# Patient Record
Sex: Female | Born: 1935 | Race: White | Hispanic: No | Smoking: Never smoker
Health system: Southern US, Community
[De-identification: ages and names within clinical notes are randomized; demographics above are authoritative.]

## PROBLEM LIST (undated history)

## (undated) DIAGNOSIS — D485 Neoplasm of uncertain behavior of skin: Secondary | ICD-10-CM

## (undated) DIAGNOSIS — I10 Essential (primary) hypertension: Secondary | ICD-10-CM

## (undated) DIAGNOSIS — R7309 Other abnormal glucose: Secondary | ICD-10-CM

## (undated) DIAGNOSIS — E039 Hypothyroidism, unspecified: Secondary | ICD-10-CM

## (undated) DIAGNOSIS — L0291 Cutaneous abscess, unspecified: Secondary | ICD-10-CM

## (undated) DIAGNOSIS — E119 Type 2 diabetes mellitus without complications: Secondary | ICD-10-CM

## (undated) DIAGNOSIS — K5790 Diverticulosis of intestine, part unspecified, without perforation or abscess without bleeding: Secondary | ICD-10-CM

## (undated) DIAGNOSIS — L0390 Cellulitis, unspecified: Secondary | ICD-10-CM

## (undated) DIAGNOSIS — D126 Benign neoplasm of colon, unspecified: Secondary | ICD-10-CM

## (undated) DIAGNOSIS — I639 Cerebral infarction, unspecified: Secondary | ICD-10-CM

## (undated) HISTORY — DX: Hypothyroidism, unspecified: E03.9

## (undated) HISTORY — PX: THYROIDECTOMY: SHX17

## (undated) HISTORY — PX: OOPHORECTOMY: SHX86

## (undated) HISTORY — DX: Type 2 diabetes mellitus without complications: E11.9

## (undated) HISTORY — PX: TONSILLECTOMY AND ADENOIDECTOMY: SUR1326

## (undated) HISTORY — PX: ABDOMINAL HYSTERECTOMY: SHX81

## (undated) HISTORY — DX: Cerebral infarction, unspecified: I63.9

## (undated) HISTORY — DX: Benign neoplasm of colon, unspecified: D12.6

## (undated) HISTORY — DX: Other abnormal glucose: R73.09

## (undated) HISTORY — DX: Neoplasm of uncertain behavior of skin: D48.5

## (undated) HISTORY — PX: APPENDECTOMY: SHX54

## (undated) HISTORY — PX: CHOLECYSTECTOMY: SHX55

## (undated) HISTORY — DX: Diverticulosis of intestine, part unspecified, without perforation or abscess without bleeding: K57.90

## (undated) HISTORY — DX: Cellulitis, unspecified: L03.90

## (undated) HISTORY — DX: Essential (primary) hypertension: I10

## (undated) HISTORY — DX: Cutaneous abscess, unspecified: L02.91

---

## 1998-10-24 ENCOUNTER — Encounter: Payer: Self-pay | Admitting: Internal Medicine

## 1999-10-28 ENCOUNTER — Encounter: Admission: RE | Admit: 1999-10-28 | Discharge: 1999-10-28 | Payer: Self-pay | Admitting: Internal Medicine

## 1999-10-28 ENCOUNTER — Encounter: Payer: Self-pay | Admitting: Internal Medicine

## 2000-10-29 ENCOUNTER — Encounter: Admission: RE | Admit: 2000-10-29 | Discharge: 2000-10-29 | Payer: Self-pay | Admitting: Internal Medicine

## 2000-10-29 ENCOUNTER — Encounter: Payer: Self-pay | Admitting: Internal Medicine

## 2001-02-23 DIAGNOSIS — D126 Benign neoplasm of colon, unspecified: Secondary | ICD-10-CM

## 2001-02-23 HISTORY — DX: Benign neoplasm of colon, unspecified: D12.6

## 2001-03-25 ENCOUNTER — Other Ambulatory Visit: Admission: RE | Admit: 2001-03-25 | Discharge: 2001-03-25 | Payer: Self-pay | Admitting: Gastroenterology

## 2001-03-25 ENCOUNTER — Encounter (INDEPENDENT_AMBULATORY_CARE_PROVIDER_SITE_OTHER): Payer: Self-pay | Admitting: Specialist

## 2001-11-05 ENCOUNTER — Encounter: Payer: Self-pay | Admitting: Internal Medicine

## 2001-11-05 ENCOUNTER — Encounter: Admission: RE | Admit: 2001-11-05 | Discharge: 2001-11-05 | Payer: Self-pay | Admitting: Internal Medicine

## 2002-08-30 ENCOUNTER — Encounter: Payer: Self-pay | Admitting: Internal Medicine

## 2002-09-10 ENCOUNTER — Encounter: Admission: RE | Admit: 2002-09-10 | Discharge: 2002-09-10 | Payer: Self-pay | Admitting: Internal Medicine

## 2002-09-10 ENCOUNTER — Encounter: Payer: Self-pay | Admitting: Internal Medicine

## 2002-09-20 ENCOUNTER — Encounter: Payer: Self-pay | Admitting: Internal Medicine

## 2002-09-20 ENCOUNTER — Encounter: Admission: RE | Admit: 2002-09-20 | Discharge: 2002-09-20 | Payer: Self-pay | Admitting: Internal Medicine

## 2002-09-27 ENCOUNTER — Encounter: Admission: RE | Admit: 2002-09-27 | Discharge: 2002-10-07 | Payer: Self-pay | Admitting: Internal Medicine

## 2002-11-07 ENCOUNTER — Encounter: Payer: Self-pay | Admitting: Internal Medicine

## 2002-11-07 ENCOUNTER — Encounter: Admission: RE | Admit: 2002-11-07 | Discharge: 2002-11-07 | Payer: Self-pay | Admitting: Internal Medicine

## 2003-12-08 ENCOUNTER — Encounter: Admission: RE | Admit: 2003-12-08 | Discharge: 2003-12-08 | Payer: Self-pay | Admitting: Internal Medicine

## 2004-03-26 ENCOUNTER — Ambulatory Visit: Payer: Self-pay | Admitting: Gastroenterology

## 2004-04-05 ENCOUNTER — Encounter: Payer: Self-pay | Admitting: Internal Medicine

## 2004-04-05 ENCOUNTER — Ambulatory Visit: Payer: Self-pay | Admitting: Gastroenterology

## 2004-04-05 HISTORY — PX: COLONOSCOPY: SHX174

## 2004-04-09 ENCOUNTER — Ambulatory Visit: Payer: Self-pay | Admitting: Internal Medicine

## 2004-09-12 ENCOUNTER — Ambulatory Visit: Payer: Self-pay | Admitting: Internal Medicine

## 2004-09-23 ENCOUNTER — Ambulatory Visit: Payer: Self-pay | Admitting: Internal Medicine

## 2004-12-18 ENCOUNTER — Encounter: Admission: RE | Admit: 2004-12-18 | Discharge: 2004-12-18 | Payer: Self-pay | Admitting: Internal Medicine

## 2005-04-23 ENCOUNTER — Ambulatory Visit: Payer: Self-pay | Admitting: Internal Medicine

## 2005-12-05 ENCOUNTER — Ambulatory Visit: Payer: Self-pay | Admitting: Internal Medicine

## 2005-12-24 ENCOUNTER — Other Ambulatory Visit: Admission: RE | Admit: 2005-12-24 | Discharge: 2005-12-24 | Payer: Self-pay | Admitting: Gynecology

## 2006-01-09 ENCOUNTER — Encounter: Admission: RE | Admit: 2006-01-09 | Discharge: 2006-01-09 | Payer: Self-pay | Admitting: Gynecology

## 2006-02-13 ENCOUNTER — Ambulatory Visit: Payer: Self-pay | Admitting: Internal Medicine

## 2006-03-11 ENCOUNTER — Ambulatory Visit: Payer: Self-pay | Admitting: Family Medicine

## 2006-03-11 LAB — CONVERTED CEMR LAB
ALT: 46 units/L — ABNORMAL HIGH (ref 0–40)
AST: 35 units/L (ref 0–37)
Basophils Relative: 0.6 % (ref 0.0–1.0)
Calcium: 9.8 mg/dL (ref 8.4–10.5)
GFR calc non Af Amer: 65 mL/min
Glomerular Filtration Rate, Af Am: 79 mL/min/{1.73_m2}
HCT: 38.4 % (ref 36.0–46.0)
HDL: 40.3 mg/dL (ref >39.0)
Hemoglobin: 12.8 g/dL (ref 12.0–15.0)
Platelets: 217 10*3/uL (ref 150–400)
RBC: 4.08 M/uL (ref 3.87–5.11)
Sodium: 141 meq/L (ref 135–145)
TSH: 3.98 microintl units/mL (ref 0.35–5.50)
Total Protein: 6.8 g/dL (ref 6.0–8.3)
VLDL: 50 mg/dL — ABNORMAL HIGH (ref 0–40)
WBC: 9.5 10*3/uL (ref 4.5–10.5)

## 2006-03-20 ENCOUNTER — Ambulatory Visit: Payer: Self-pay | Admitting: Family Medicine

## 2006-04-21 ENCOUNTER — Ambulatory Visit: Payer: Self-pay | Admitting: Internal Medicine

## 2006-04-22 LAB — CONVERTED CEMR LAB
ALT: 60 units/L — ABNORMAL HIGH (ref 0–40)
AST: 46 units/L — ABNORMAL HIGH (ref 0–37)
Albumin: 3.9 g/dL (ref 3.5–5.2)
Alkaline Phosphatase: 88 units/L (ref 39–117)
BUN: 11 mg/dL (ref 6–23)
Basophils Absolute: 0.1 10*3/uL (ref 0.0–0.1)
Basophils Relative: 1 % (ref 0.0–1.0)
Bilirubin, Direct: 0.1 mg/dL (ref 0.0–0.3)
CO2: 28 meq/L (ref 19–32)
Calcium: 9.6 mg/dL (ref 8.4–10.5)
Chloride: 103 meq/L (ref 96–112)
Creatinine, Ser: 0.9 mg/dL (ref 0.4–1.2)
Eosinophil percent: 2.3 % (ref 0.0–5.0)
GFR calc non Af Amer: 65 mL/min
Glomerular Filtration Rate, Af Am: 79 mL/min/{1.73_m2}
Glucose, Bld: 91 mg/dL (ref 70–99)
HCT: 43.1 % (ref 36.0–46.0)
Hemoglobin: 14.1 g/dL (ref 12.0–15.0)
Lymphocytes Relative: 35.5 % (ref 12.0–46.0)
MCHC: 32.7 g/dL (ref 30.0–36.0)
MCV: 93.7 fL (ref 78.0–100.0)
Monocytes Absolute: 0.5 10*3/uL (ref 0.2–0.7)
Monocytes Relative: 6.1 % (ref 3.0–11.0)
Neutro Abs: 5 10*3/uL (ref 1.4–7.7)
Neutrophils Relative %: 55.1 % (ref 43.0–77.0)
Platelets: 225 10*3/uL (ref 150–400)
Potassium: 4.8 meq/L (ref 3.5–5.1)
RBC: 4.6 M/uL (ref 3.87–5.11)
RDW: 12.5 % (ref 11.5–14.6)
Sodium: 141 meq/L (ref 135–145)
Total Bilirubin: 0.9 mg/dL (ref 0.3–1.2)
Total Protein: 7 g/dL (ref 6.0–8.3)
WBC: 9 10*3/uL (ref 4.5–10.5)

## 2006-04-24 ENCOUNTER — Ambulatory Visit: Payer: Self-pay | Admitting: Family Medicine

## 2006-05-05 ENCOUNTER — Ambulatory Visit: Payer: Self-pay | Admitting: Internal Medicine

## 2006-05-05 LAB — CONVERTED CEMR LAB
ALT: 48 units/L — ABNORMAL HIGH (ref 0–40)
AST: 27 units/L (ref 0–37)
Albumin: 3.5 g/dL (ref 3.5–5.2)
Alkaline Phosphatase: 75 units/L (ref 39–117)
Bilirubin, Direct: 0.1 mg/dL (ref 0.0–0.3)
Total Bilirubin: 0.6 mg/dL (ref 0.3–1.2)
Total Protein: 6.7 g/dL (ref 6.0–8.3)

## 2006-06-29 ENCOUNTER — Ambulatory Visit: Payer: Self-pay | Admitting: Internal Medicine

## 2006-06-29 ENCOUNTER — Encounter: Admission: RE | Admit: 2006-06-29 | Discharge: 2006-06-29 | Payer: Self-pay | Admitting: Internal Medicine

## 2006-07-13 ENCOUNTER — Ambulatory Visit: Payer: Self-pay | Admitting: Internal Medicine

## 2006-07-13 LAB — CONVERTED CEMR LAB
BUN: 12 mg/dL (ref 6–23)
Basophils Absolute: 0.1 10*3/uL (ref 0.0–0.1)
Basophils Relative: 0.8 % (ref 0.0–1.0)
CO2: 28 meq/L (ref 19–32)
Calcium: 9.8 mg/dL (ref 8.4–10.5)
Chloride: 105 meq/L (ref 96–112)
Creatinine, Ser: 0.8 mg/dL (ref 0.4–1.2)
Eosinophils Absolute: 0.3 10*3/uL (ref 0.0–0.6)
Eosinophils Relative: 2.8 % (ref 0.0–5.0)
GFR calc Af Amer: 90 mL/min
GFR calc non Af Amer: 75 mL/min
Glucose, Bld: 86 mg/dL (ref 70–99)
HCT: 39.7 % (ref 36.0–46.0)
Hemoglobin: 13.7 g/dL (ref 12.0–15.0)
Lymphocytes Relative: 36.1 % (ref 12.0–46.0)
MCHC: 34.6 g/dL (ref 30.0–36.0)
MCV: 91.2 fL (ref 78.0–100.0)
Monocytes Absolute: 0.8 10*3/uL — ABNORMAL HIGH (ref 0.2–0.7)
Monocytes Relative: 8.1 % (ref 3.0–11.0)
Neutro Abs: 4.8 10*3/uL (ref 1.4–7.7)
Neutrophils Relative %: 52.2 % (ref 43.0–77.0)
Platelets: 225 10*3/uL (ref 150–400)
Potassium: 4.4 meq/L (ref 3.5–5.1)
RBC: 4.35 M/uL (ref 3.87–5.11)
RDW: 12.7 % (ref 11.5–14.6)
Sodium: 143 meq/L (ref 135–145)
WBC: 9.4 10*3/uL (ref 4.5–10.5)

## 2006-07-16 ENCOUNTER — Ambulatory Visit: Payer: Self-pay | Admitting: Cardiology

## 2006-10-06 ENCOUNTER — Ambulatory Visit: Payer: Self-pay | Admitting: Internal Medicine

## 2007-02-17 ENCOUNTER — Encounter: Admission: RE | Admit: 2007-02-17 | Discharge: 2007-02-17 | Payer: Self-pay | Admitting: Internal Medicine

## 2007-03-17 ENCOUNTER — Ambulatory Visit: Payer: Self-pay | Admitting: Internal Medicine

## 2007-03-18 ENCOUNTER — Encounter: Payer: Self-pay | Admitting: Internal Medicine

## 2007-03-19 ENCOUNTER — Telehealth: Payer: Self-pay | Admitting: Internal Medicine

## 2007-03-21 ENCOUNTER — Encounter: Admission: RE | Admit: 2007-03-21 | Discharge: 2007-03-21 | Payer: Self-pay | Admitting: Internal Medicine

## 2007-04-01 ENCOUNTER — Telehealth: Payer: Self-pay | Admitting: Internal Medicine

## 2007-04-05 ENCOUNTER — Ambulatory Visit (HOSPITAL_COMMUNITY): Admission: RE | Admit: 2007-04-05 | Discharge: 2007-04-05 | Payer: Self-pay | Admitting: Internal Medicine

## 2007-04-05 ENCOUNTER — Encounter: Payer: Self-pay | Admitting: Internal Medicine

## 2007-04-09 ENCOUNTER — Telehealth: Payer: Self-pay | Admitting: Internal Medicine

## 2007-04-10 ENCOUNTER — Ambulatory Visit (HOSPITAL_COMMUNITY): Admission: RE | Admit: 2007-04-10 | Discharge: 2007-04-10 | Payer: Self-pay | Admitting: Internal Medicine

## 2007-04-13 ENCOUNTER — Ambulatory Visit: Payer: Self-pay | Admitting: Internal Medicine

## 2007-04-13 ENCOUNTER — Telehealth: Payer: Self-pay | Admitting: Internal Medicine

## 2007-04-13 DIAGNOSIS — I10 Essential (primary) hypertension: Secondary | ICD-10-CM | POA: Insufficient documentation

## 2007-04-13 DIAGNOSIS — E039 Hypothyroidism, unspecified: Secondary | ICD-10-CM | POA: Insufficient documentation

## 2007-04-13 HISTORY — DX: Hypothyroidism, unspecified: E03.9

## 2007-04-13 HISTORY — DX: Essential (primary) hypertension: I10

## 2007-04-15 ENCOUNTER — Encounter: Payer: Self-pay | Admitting: Internal Medicine

## 2007-04-19 ENCOUNTER — Encounter: Admission: RE | Admit: 2007-04-19 | Discharge: 2007-05-26 | Payer: Self-pay | Admitting: Orthopedic Surgery

## 2007-04-21 ENCOUNTER — Ambulatory Visit (HOSPITAL_COMMUNITY): Admission: RE | Admit: 2007-04-21 | Discharge: 2007-04-21 | Payer: Self-pay | Admitting: Orthopaedic Surgery

## 2007-04-21 ENCOUNTER — Ambulatory Visit: Payer: Self-pay | Admitting: *Deleted

## 2007-04-21 ENCOUNTER — Encounter (INDEPENDENT_AMBULATORY_CARE_PROVIDER_SITE_OTHER): Payer: Self-pay | Admitting: Orthopaedic Surgery

## 2007-05-28 ENCOUNTER — Encounter: Admission: RE | Admit: 2007-05-28 | Discharge: 2007-06-04 | Payer: Self-pay | Admitting: Orthopedic Surgery

## 2007-09-29 ENCOUNTER — Ambulatory Visit: Payer: Self-pay | Admitting: Internal Medicine

## 2007-09-30 LAB — CONVERTED CEMR LAB
ALT: 67 units/L — ABNORMAL HIGH (ref 0–35)
AST: 51 units/L — ABNORMAL HIGH (ref 0–37)
Albumin: 3.8 g/dL (ref 3.5–5.2)
Alkaline Phosphatase: 73 units/L (ref 39–117)
Basophils Absolute: 0 10*3/uL (ref 0.0–0.1)
Creatinine, Ser: 0.9 mg/dL (ref 0.4–1.2)
Eosinophils Absolute: 0.2 10*3/uL (ref 0.0–0.7)
Glucose, Bld: 119 mg/dL — ABNORMAL HIGH (ref 70–99)
HCT: 41.2 % (ref 36.0–46.0)
Hemoglobin: 13.9 g/dL (ref 12.0–15.0)
MCHC: 33.8 g/dL (ref 30.0–36.0)
MCV: 92.3 fL (ref 78.0–100.0)
Monocytes Absolute: 0.5 10*3/uL (ref 0.1–1.0)
Neutrophils Relative %: 56.1 % (ref 43.0–77.0)
Potassium: 4.5 meq/L (ref 3.5–5.1)
RBC: 4.46 M/uL (ref 3.87–5.11)
Sodium: 145 meq/L (ref 135–145)
Total Protein: 7.3 g/dL (ref 6.0–8.3)

## 2007-10-04 ENCOUNTER — Telehealth (INDEPENDENT_AMBULATORY_CARE_PROVIDER_SITE_OTHER): Payer: Self-pay | Admitting: *Deleted

## 2007-10-05 ENCOUNTER — Ambulatory Visit: Payer: Self-pay | Admitting: Cardiology

## 2007-10-07 ENCOUNTER — Telehealth: Payer: Self-pay | Admitting: Internal Medicine

## 2007-11-12 ENCOUNTER — Telehealth: Payer: Self-pay | Admitting: Internal Medicine

## 2008-01-28 ENCOUNTER — Telehealth (INDEPENDENT_AMBULATORY_CARE_PROVIDER_SITE_OTHER): Payer: Self-pay | Admitting: *Deleted

## 2008-02-01 ENCOUNTER — Ambulatory Visit: Payer: Self-pay | Admitting: Internal Medicine

## 2008-02-01 LAB — CONVERTED CEMR LAB
Glucose, Urine, Semiquant: NEGATIVE
Specific Gravity, Urine: 1.025
WBC Urine, dipstick: NEGATIVE

## 2008-02-02 ENCOUNTER — Telehealth: Payer: Self-pay | Admitting: Internal Medicine

## 2008-02-10 ENCOUNTER — Ambulatory Visit: Payer: Self-pay | Admitting: Internal Medicine

## 2008-02-11 LAB — CONVERTED CEMR LAB
BUN: 14 mg/dL (ref 6–23)
Basophils Absolute: 0 10*3/uL (ref 0.0–0.1)
Chloride: 107 meq/L (ref 96–112)
Creatinine, Ser: 0.9 mg/dL (ref 0.4–1.2)
Direct LDL: 139.7 mg/dL
Eosinophils Relative: 2.5 % (ref 0.0–5.0)
GFR calc non Af Amer: 65 mL/min
Glucose, Bld: 124 mg/dL — ABNORMAL HIGH (ref 70–99)
Hemoglobin: 14.2 g/dL (ref 12.0–15.0)
Lymphocytes Relative: 39.2 % (ref 12.0–46.0)
MCHC: 34.4 g/dL (ref 30.0–36.0)
Monocytes Absolute: 0.5 10*3/uL (ref 0.1–1.0)
Neutro Abs: 3.6 10*3/uL (ref 1.4–7.7)
Platelets: 221 10*3/uL (ref 150–400)
RBC: 4.39 M/uL (ref 3.87–5.11)
Sodium: 144 meq/L (ref 135–145)
TSH: 2.87 microintl units/mL (ref 0.35–5.50)
Total CHOL/HDL Ratio: 3.9
Triglycerides: 117 mg/dL (ref 0–149)
VLDL: 23 mg/dL (ref 0–40)
WBC: 7 10*3/uL (ref 4.5–10.5)

## 2008-02-18 ENCOUNTER — Encounter: Admission: RE | Admit: 2008-02-18 | Discharge: 2008-02-18 | Payer: Self-pay | Admitting: Internal Medicine

## 2008-03-29 ENCOUNTER — Telehealth: Payer: Self-pay | Admitting: Internal Medicine

## 2008-04-10 ENCOUNTER — Ambulatory Visit: Payer: Self-pay | Admitting: Cardiology

## 2008-05-01 ENCOUNTER — Telehealth: Payer: Self-pay | Admitting: Internal Medicine

## 2008-10-18 ENCOUNTER — Ambulatory Visit: Payer: Self-pay | Admitting: Internal Medicine

## 2008-10-18 ENCOUNTER — Encounter: Payer: Self-pay | Admitting: Internal Medicine

## 2008-10-24 ENCOUNTER — Telehealth: Payer: Self-pay | Admitting: Internal Medicine

## 2008-11-30 ENCOUNTER — Ambulatory Visit: Payer: Self-pay | Admitting: Internal Medicine

## 2008-12-18 ENCOUNTER — Ambulatory Visit: Payer: Self-pay | Admitting: Family Medicine

## 2008-12-18 LAB — CONVERTED CEMR LAB
Ketones, urine, test strip: NEGATIVE
Nitrite: NEGATIVE
Protein, U semiquant: NEGATIVE
Specific Gravity, Urine: <1.005
Urobilinogen, UA: 0.2
WBC Urine, dipstick: NEGATIVE
pH: 6.5

## 2008-12-19 ENCOUNTER — Encounter: Payer: Self-pay | Admitting: Internal Medicine

## 2008-12-26 ENCOUNTER — Encounter: Payer: Self-pay | Admitting: Internal Medicine

## 2009-02-22 ENCOUNTER — Encounter: Admission: RE | Admit: 2009-02-22 | Discharge: 2009-02-22 | Payer: Self-pay | Admitting: Internal Medicine

## 2009-04-04 ENCOUNTER — Ambulatory Visit: Payer: Self-pay | Admitting: Internal Medicine

## 2009-05-22 ENCOUNTER — Ambulatory Visit: Payer: Self-pay | Admitting: Internal Medicine

## 2009-05-22 DIAGNOSIS — R7309 Other abnormal glucose: Secondary | ICD-10-CM

## 2009-05-22 DIAGNOSIS — E119 Type 2 diabetes mellitus without complications: Secondary | ICD-10-CM | POA: Insufficient documentation

## 2009-05-22 HISTORY — DX: Type 2 diabetes mellitus without complications: E11.9

## 2009-05-22 HISTORY — DX: Other abnormal glucose: R73.09

## 2009-05-22 LAB — CONVERTED CEMR LAB
ALT: 151 units/L — ABNORMAL HIGH (ref 0–35)
BUN: 9 mg/dL (ref 6–23)
Calcium: 9.5 mg/dL (ref 8.4–10.5)
Creatinine, Ser: 0.8 mg/dL (ref 0.4–1.2)
Direct LDL: 129.3 mg/dL
Eosinophils Relative: 1.5 % (ref 0.0–5.0)
GFR calc non Af Amer: 73.86 mL/min (ref >60)
Lymphocytes Relative: 30.9 % (ref 12.0–46.0)
Lymphs Abs: 2 10*3/uL (ref 0.7–4.0)
MCHC: 33.1 g/dL (ref 30.0–36.0)
Monocytes Relative: 7.8 % (ref 3.0–12.0)
Neutrophils Relative %: 59.2 % (ref 43.0–77.0)
Potassium: 4.6 meq/L (ref 3.5–5.1)
RDW: 12.5 % (ref 11.5–14.6)
Sodium: 140 meq/L (ref 135–145)
Total CHOL/HDL Ratio: 4
Triglycerides: 152 mg/dL — ABNORMAL HIGH (ref 0.0–149.0)
VLDL: 30.4 mg/dL (ref 0.0–40.0)

## 2009-06-29 ENCOUNTER — Encounter: Admission: RE | Admit: 2009-06-29 | Discharge: 2009-06-29 | Payer: Self-pay | Admitting: Internal Medicine

## 2009-07-06 ENCOUNTER — Encounter: Payer: Self-pay | Admitting: Internal Medicine

## 2009-12-18 ENCOUNTER — Ambulatory Visit: Payer: Self-pay | Admitting: Family Medicine

## 2009-12-18 DIAGNOSIS — L0291 Cutaneous abscess, unspecified: Secondary | ICD-10-CM | POA: Insufficient documentation

## 2009-12-18 DIAGNOSIS — L0390 Cellulitis, unspecified: Secondary | ICD-10-CM

## 2009-12-18 HISTORY — DX: Cellulitis, unspecified: L03.90

## 2009-12-18 HISTORY — DX: Cutaneous abscess, unspecified: L02.91

## 2010-01-11 ENCOUNTER — Ambulatory Visit: Payer: Self-pay | Admitting: Family Medicine

## 2010-01-11 DIAGNOSIS — D485 Neoplasm of uncertain behavior of skin: Secondary | ICD-10-CM

## 2010-01-11 HISTORY — DX: Neoplasm of uncertain behavior of skin: D48.5

## 2010-02-07 ENCOUNTER — Ambulatory Visit: Payer: Self-pay | Admitting: Internal Medicine

## 2010-02-27 ENCOUNTER — Encounter: Admission: RE | Admit: 2010-02-27 | Discharge: 2010-02-27 | Payer: Self-pay | Admitting: Internal Medicine

## 2010-03-07 LAB — HM MAMMOGRAPHY

## 2010-04-09 ENCOUNTER — Ambulatory Visit: Payer: Self-pay | Admitting: Internal Medicine

## 2010-05-10 ENCOUNTER — Telehealth: Payer: Self-pay | Admitting: Internal Medicine

## 2010-06-27 NOTE — Letter (Signed)
Summary: [facility]  [facility]   Imported By: Maryln Gottron 07/16/2009 14:51:04  _____________________________________________________________________  External Attachment:    Type:   Image     Comment:   External Document

## 2010-06-27 NOTE — Progress Notes (Signed)
Summary: Pt req med for extreme pain in heel.Pls call in to [facility]   Phone Note Call from Patient Call back at [facility] Phone [phone]   Caller: Patient Summary of Call: Pt called  re: heel pain. Pt says that the pain is unbearable and is actually making pt sick to her stomach. Pt req pain med. Pls call in to [facility].    Initial call taken by: Lucy Antigua,  May 10, 2010 3:18 PM  Follow-up for Phone Call        unusual complaint.if symptoms persist she should schedule an office visit. Follow-up by: Birdie Sons MD,  May 12, 2010 6:42 PM  Additional Follow-up for Phone Call Additional follow up Details #1::        Seen Dr Caryl Never in August and he thought she could have plantar Faciitis.  She wanted to know if you could call her in a pain without being seen.  [facility] Additional Follow-up by: Alfred Levins, CMA,  May 13, 2010 8:59 AM    Additional Follow-up for Phone Call Additional follow up Details #2::    ultram 50 mg by mouth two times a day as needed pain ice to heels 30 min 2 times daily wear tennis shoes and never go "bare footed" Follow-up by: Birdie Sons MD,  May 13, 2010 9:47 AM  Additional Follow-up for Phone Call Additional follow up Details #3:: Details for Additional Follow-up Action Taken: rx called in, pt aware Additional Follow-up by: Alfred Levins, CMA,  May 13, 2010 3:41 PM  New/Updated Medications: ULTRAM 50 MG TABS (TRAMADOL HCL) 1 by mouth two times a day as needed for pain Prescriptions: ULTRAM 50 MG TABS (TRAMADOL HCL) 1 by mouth two times a day as needed for pain  #30 x 0   Entered by:   Alfred Levins, CMA   Authorized by:   Birdie Sons MD   Signed by:   Alfred Levins, CMA on 05/13/2010   Method used:   Electronically to        [id number] (ret[facility])       [address]       [place], [place]  [address]       Ph: [phone] or [phone]       Fax: [phone]   RxID:    [id number]

## 2010-06-27 NOTE — Assessment & Plan Note (Signed)
Summary: flu shot/cjr   Nurse Visit   Allergies: 1)  ! Diclofenac Sodium (Diclofenac Sodium) 2)  Feldene (Piroxicam)  Orders Added: 1)  Flu Vaccine 41yrs + MEDICARE PATIENTS [Q2039] 2)  Administration Flu vaccine - MCR [G0008] Flu Vaccine Consent Questions     Do you have a history of severe allergic reactions to this vaccine? no    Any prior history of allergic reactions to egg and/or gelatin? no    Do you have a sensitivity to the preservative Thimersol? no    Do you have a past history of Guillan-Barre Syndrome? no    Do you currently have an acute febrile illness? no    Have you ever had a severe reaction to latex? no    Vaccine information given and explained to patient? yes    Are you currently pregnant? no    Lot Number:[id number]   Exp Date:11/23/2010   Site Given  Right Deltoid IM .[email or web address]

## 2010-06-27 NOTE — Assessment & Plan Note (Signed)
Summary: EAR PAIN//SLM   Vital Signs:  Patient profile:   77 year old female Temp:     98.0 degrees F oral BP sitting:   122 / 78  (left arm) Cuff size:   regular  Vitals Entered By: Sid Falcon LPN (December 18, 2009 9:59 AM) CC: left ear pain X 1 week   History of Present Illness: One week history of achy left external canal ear pain.  No drainage. No hearing changes. No history of recent swimming. Denies any fever or chills. No alleviating factors.  Exacerbated by lying on affected side.  Allergies: 1)  ! Diclofenac Sodium (Diclofenac Sodium) 2)  Feldene (Piroxicam)  Past History:  Past Medical History: Last updated: 05/22/2009 see paper chart. Hypertension Hypothyroidism Diabetes mellitus, type II  Review of Systems  The patient denies fever, decreased hearing, and headaches.    Physical Exam  General:  Well-developed,well-nourished,in no acute distress; alert,appropriate and cooperative throughout examination Ears:  left external canal reveals small abscess floor of canal with whitish pustule near the center. This is tender to palpation. Moderate erythema. Deeper aspect of canal and eardrum appear normal Mouth:  Oral mucosa and oropharynx without lesions or exudates.  Teeth in good repair. Neck:  No deformities, masses, or tenderness noted. Heart:  normal rate and regular rhythm.     Impression & Recommendations:  Problem # 1:  ABSCESS (ICD-682.9)  after obtaining consent from patient used 25 gauge needle and unroofed pustule.  Using hemostats applied pressure and removed solid "core" along with some liquid purulence.  Pt felt better afterwards.  Orders: I&D Abscess, Simple / Single (10060)  Complete Medication List: 1)  Norvasc 5 Mg Tabs (Amlodipine besylate) .... One by mouth daily 2)  Levoxyl 150 Mcg Tabs (Levothyroxine sodium) .... One by mouth daily 3)  Aspir-81 81 Mg Tbec (Aspirin) .... Take 1 tablet by mouth once a day  Patient Instructions: 1)  Use  a warm washcloth to left ear several times daily

## 2010-06-27 NOTE — Assessment & Plan Note (Signed)
Summary: LESION REMOVAL/NJR   Procedure Note  Mole Biopsy/Removal: Onset of lesion: 2 months Indication: scaley/enlarging  Procedure # 1: destruction---liquid ntg    Size (in cm): 1.1 x 1.0    Comment: verbal consent obtained  CC: possible lession removal   CC:  possible lession removal.  Allergies: 1)  ! Diclofenac Sodium (Diclofenac Sodium) 2)  Feldene (Piroxicam)   Complete Medication List: 1)  Norvasc 5 Mg Tabs (Amlodipine besylate) .... One by mouth daily 2)  Levoxyl 150 Mcg Tabs (Levothyroxine sodium) .... One by mouth daily 3)  Aspir-81 81 Mg Tbec (Aspirin) .... Take 1 tablet by mouth once a day  Other Orders: Cryotherapy/Destruction benign or premalignant lesion (1st lesion)  (17000)

## 2010-06-27 NOTE — Assessment & Plan Note (Signed)
Summary: heel pain//ccm   Vital Signs:  Patient profile:   77 year old female Weight:      200 pounds Temp:     98.0 degrees F oral BP sitting:   140 / 74  (left arm) Cuff size:   large  Vitals Entered By: Sid Falcon LPN (January 11, 2010 2:29 PM)  History of Present Illness: Here for the following items:  Left heel pain. 3 weeks' duration. No injury. She plays tennis. No change of shoes. Usually does not wear good supportive shoes. Pain worse with activity. Only localized heel region. No visible edema or ecchymosis. Celebrex without relief.  Pain noted throughout the day.  No alleviating factors.  Skin lesion left lower leg. Comes and goes past year. Nonpruritic and nonpainful. No history of biopsy.  Intermittent left ear pain. Recent small pustule was drained. No further drainage. Overall improved from last visit.  Allergies: 1)  ! Diclofenac Sodium (Diclofenac Sodium) 2)  Feldene (Piroxicam)  Past History:  Past Medical History: Last updated: 05/22/2009 see paper chart. Hypertension Hypothyroidism Diabetes mellitus, type II  Past Surgical History: Last updated: 04/26/07 Appendectomy Cholecystectomy Hysterectomy--fibroids Thyroidectomy, secondary to nodule, no cancer Tonsillectomy, adenoidectomy Oophorectomy  Family History: Last updated: 04/26/2007 father deceased stroke mother htn 2 sisters-healthy  Social History: Last updated: 02/10/2008 Married  Risk Factors: Smoking Status: never (04-26-07) PMH-FH-SH reviewed for relevance  Review of Systems  The patient denies anorexia, fever, weight loss, chest pain, syncope, dyspnea on exertion, peripheral edema, prolonged cough, headaches, abdominal pain, melena, hematochezia, severe indigestion/heartburn, and difficulty walking.    Physical Exam  General:  Well-developed,well-nourished,in no acute distress; alert,appropriate and cooperative throughout examination Head:  Normocephalic and atraumatic  without obvious abnormalities. No apparent alopecia or balding. Ears:  left ear canal appears normal at this time. Eardrum is normal Mouth:  Oral mucosa and oropharynx without lesions or exudates.  Teeth in good repair. Neck:  No deformities, masses, or tenderness noted. Lungs:  Normal respiratory effort, chest expands symmetrically. Lungs are clear to auscultation, no crackles or wheezes. Heart:  normal rate and regular rhythm.   Extremities:  left heel plantar fascia region slightly tender. No visible swelling. Achilles Tendon is nontender. Full range of motion ankle. No ecchymosis. No warmth. Skin:  left lower leg reveals slightly nodular minimally raised lesion which is approximately 5 x 5 mm. No ulcerative changes.  No pigmentary change.   Impression & Recommendations:  Problem # 1:  ABSCESS (ICD-682.9) Assessment Improved ear canal abscess recently drained resolved.  No evid for recurrence.  Problem # 2:  HEEL PAIN (ICD-729.5) Assessment: New ?plantar faciitis.  Discussed icing and stretches and better supportive shoes.  Consider steroid injection depending on her  response and tolerance to pain.  Problem # 3:  NEOPLASM, SKIN, UNCERTAIN BEHAVIOR (ICD-238.2) rec f/u with primary to consider bx/excision.  Complete Medication List: 1)  Norvasc 5 Mg Tabs (Amlodipine besylate) .... One by mouth daily 2)  Levoxyl 150 Mcg Tabs (Levothyroxine sodium) .... One by mouth daily 3)  Aspir-81 81 Mg Tbec (Aspirin) .... Take 1 tablet by mouth once a day  Patient Instructions: 1)  You have likely plantar faciitis. 2)  Stretch heel frequently. Consider icing. 3)  Use good supportive shoes. 4)  Follow up with Dr Cato Mulligan if skin lesion L leg not resolving over the next couple of months.

## 2010-08-09 ENCOUNTER — Other Ambulatory Visit: Payer: Self-pay | Admitting: Internal Medicine

## 2010-08-09 DIAGNOSIS — E039 Hypothyroidism, unspecified: Secondary | ICD-10-CM

## 2011-01-04 ENCOUNTER — Other Ambulatory Visit: Payer: Self-pay | Admitting: Internal Medicine

## 2011-01-06 ENCOUNTER — Ambulatory Visit (INDEPENDENT_AMBULATORY_CARE_PROVIDER_SITE_OTHER): Payer: 59 | Admitting: Family Medicine

## 2011-01-06 ENCOUNTER — Encounter: Payer: Self-pay | Admitting: Family Medicine

## 2011-01-06 VITALS — BP 160/90 | Temp 98.4°F

## 2011-01-06 DIAGNOSIS — R1011 Right upper quadrant pain: Secondary | ICD-10-CM

## 2011-01-06 DIAGNOSIS — M546 Pain in thoracic spine: Secondary | ICD-10-CM

## 2011-01-06 DIAGNOSIS — E039 Hypothyroidism, unspecified: Secondary | ICD-10-CM

## 2011-01-06 DIAGNOSIS — E119 Type 2 diabetes mellitus without complications: Secondary | ICD-10-CM

## 2011-01-06 MED ORDER — TRAMADOL HCL 50 MG PO TABS: 50.0000 mg | ORAL_TABLET | Freq: Four times a day (QID) | ORAL | Status: DC | PRN

## 2011-01-06 NOTE — Progress Notes (Signed)
  Subjective:    Patient ID: Amy [name], female    DOB: 1929/12/18, 77 y.o.   MRN: [id number]  HPI Approximately one week history of right-sided pain. Location is right thoracic around T8 and radiates anterior under right breast. No rash. Pain is relatively constant but worse at night. Previous cholecystectomy. Pain is occasional burning quality. 8-9/10 severity at times. Interfering with sleep. Denies any nausea, vomiting, diarrhea, change of appetite, weight change, fever, chills, dyspnea, cough, or any pleuritic pain. No history of injury. Has taken Aleve with minimal relief.  She has history of type 2 diabetes. No A1c in over one year. Previous elevated A1c over 9. Does not take any medications for that. Hypothyroidism treated with Levoxyl 150 mcg daily. Her recent TSH.  Past Medical History  Diagnosis Date  . NEOPLASM, SKIN, UNCERTAIN BEHAVIOR 01/11/2010  . HYPOTHYROIDISM 04/13/2007  . DIABETES MELLITUS, TYPE II 05/22/2009  . HYPERTENSION 04/13/2007  . ABSCESS 12/18/2009  . HYPERGLYCEMIA 05/22/2009   Past Surgical History  Procedure Date  . Appendectomy   . Cholecystectomy   . Abdominal hysterectomy   . Thyroidectomy     secondary to nodule, no cancer  . Tonsillectomy and adenoidectomy   . Oophorectomy     reports that she has never smoked. She does not have any smokeless tobacco history on file. Her alcohol and drug histories not on file. family history includes Hypertension in her mother and Stroke in her father. Allergies  Allergen Reactions  . Diclofenac Sodium     REACTION: nausea  . Piroxicam     REACTION: "SEVERE ALLERGY REACTION"      Review of Systems  Constitutional: Negative for fever, chills, appetite change and unexpected weight change.  Respiratory: Negative for cough and shortness of breath.   Cardiovascular: Negative for chest pain and leg swelling.  Gastrointestinal: Negative for nausea, vomiting, diarrhea, constipation and blood in stool.    Genitourinary: Negative for dysuria.  Skin: Negative for rash.  Neurological: Negative for dizziness.  Hematological: Negative for adenopathy. Does not bruise/bleed easily.       Objective:   Physical Exam  Constitutional: She is oriented to person, place, and time. She appears well-developed and well-nourished. No distress.  Neck: Neck supple.  Cardiovascular: Normal rate and regular rhythm.   Pulmonary/Chest: Effort normal and breath sounds normal. No respiratory distress. She has no wheezes. She has no rales.  Abdominal: Soft. Bowel sounds are normal. She exhibits no distension. There is no tenderness. There is no rebound and no guarding.       No hepatomegaly noted  Musculoskeletal: She exhibits no edema.  Lymphadenopathy:    She has no cervical adenopathy.  Neurological: She is alert and oriented to person, place, and time.  Skin: No rash noted.  Psychiatric: She has a normal mood and affect. Her behavior is normal.          Assessment & Plan:  Right thoracic pain radiating toward abdomen. Symptoms seem more neuropathic but no rash at this time. Obtain screening lab work. Tramadol 50 mg one to 2 every 6 hours when necessary. Followup promptly for rash or worsening symptoms  Hypothyroidism. Recheck TSH  History type 2 diabetes. Reassess A1c

## 2011-01-06 NOTE — Patient Instructions (Signed)
Follow up promptly for any fever, rash, or any worsening symptoms.

## 2011-01-07 LAB — LIPASE: Lipase: 30 U/L (ref 11.0–59.0)

## 2011-01-07 LAB — CBC WITH DIFFERENTIAL/PLATELET
Basophils Absolute: 0.2 10*3/uL — ABNORMAL HIGH (ref 0.0–0.1)
Basophils Relative: 2 % (ref 0.0–3.0)
Eosinophils Absolute: 0.2 10*3/uL (ref 0.0–0.7)
Lymphocytes Relative: 32.7 % (ref 12.0–46.0)
MCHC: 33.5 g/dL (ref 30.0–36.0)
MCV: 93 fl (ref 78.0–100.0)
Monocytes Absolute: 0.6 10*3/uL (ref 0.1–1.0)
Neutrophils Relative %: 55.9 % (ref 43.0–77.0)
Platelets: 183 10*3/uL (ref 150.0–400.0)
RBC: 4.38 Mil/uL (ref 3.87–5.11)
RDW: 13.1 % (ref 11.5–14.6)

## 2011-01-07 LAB — TSH: TSH: 4.85 u[IU]/mL (ref 0.35–5.50)

## 2011-01-07 LAB — HEPATIC FUNCTION PANEL
Bilirubin, Direct: 0.1 mg/dL (ref 0.0–0.3)
Total Bilirubin: 0.7 mg/dL (ref 0.3–1.2)
Total Protein: 7.7 g/dL (ref 6.0–8.3)

## 2011-01-07 LAB — HEMOGLOBIN A1C: Hgb A1c MFr Bld: 8.6 % — ABNORMAL HIGH (ref 4.6–6.5)

## 2011-01-08 ENCOUNTER — Telehealth: Payer: Self-pay | Admitting: Internal Medicine

## 2011-01-08 NOTE — Telephone Encounter (Signed)
Pt called and had an ov with Dr Caryl Never and had lab work done. Some of pts lab work came back abnormal and pt was advised to sch a fup with pcp asap,  to discuss diabetes issues and options for rx. Pls advise as to when to sch pt with Dr Cato Mulligan asap.

## 2011-01-08 NOTE — Telephone Encounter (Signed)
Refer to [facility] for diet and weight loss. See me next month to review DM only. She should avoid all concentrated sweets and exercise daily. Low calorie diet (1500 Cal diet)

## 2011-01-08 NOTE — Progress Notes (Signed)
Quick Note:  Pt informed and she will schedule F/U with Dr Cato Mulligan ______

## 2011-01-08 NOTE — Telephone Encounter (Signed)
Left detailed message with Dr. Marliss Coots recommendations for pt.

## 2011-01-13 ENCOUNTER — Encounter: Payer: Self-pay | Admitting: Internal Medicine

## 2011-01-13 ENCOUNTER — Ambulatory Visit (INDEPENDENT_AMBULATORY_CARE_PROVIDER_SITE_OTHER): Payer: 59 | Admitting: Internal Medicine

## 2011-01-13 VITALS — BP 134/80 | HR 80 | Temp 98.2°F

## 2011-01-13 DIAGNOSIS — R109 Unspecified abdominal pain: Secondary | ICD-10-CM

## 2011-01-13 LAB — POCT URINALYSIS DIPSTICK
Blood, UA: NEGATIVE
Glucose, UA: NEGATIVE
Nitrite, UA: NEGATIVE
Protein, UA: NEGATIVE
Spec Grav, UA: 1.02
Urobilinogen, UA: 0.2

## 2011-01-13 NOTE — Assessment & Plan Note (Signed)
Persistent flank pain Check UA Will likely need CT abd/pelvis given persistent pain and abnormal liver function tests.

## 2011-01-13 NOTE — Progress Notes (Signed)
  Subjective:    Patient ID: Amy [name], female    DOB: Apr 12, 1930, 77 y.o.   MRN: [id number]  HPI  10 day hx of left sided flank pain. No hematuria. No known trauma or unusual activities. Lying on back increases pain. Sitting is the most comfortable position. No nausea Describes intermittent 8/10 pain.  Past Medical History  Diagnosis Date  . NEOPLASM, SKIN, UNCERTAIN BEHAVIOR 01/11/2010  . HYPOTHYROIDISM 04/13/2007  . DIABETES MELLITUS, TYPE II 05/22/2009  . HYPERTENSION 04/13/2007  . ABSCESS 12/18/2009  . HYPERGLYCEMIA 05/22/2009   Past Surgical History  Procedure Date  . Appendectomy   . Cholecystectomy   . Abdominal hysterectomy   . Thyroidectomy     secondary to nodule, no cancer  . Tonsillectomy and adenoidectomy   . Oophorectomy     reports that she has never smoked. She does not have any smokeless tobacco history on file. Her alcohol and drug histories not on file. family history includes Hypertension in her mother and Stroke in her father. Allergies  Allergen Reactions  . Diclofenac Sodium     REACTION: nausea  . Piroxicam     REACTION: "SEVERE ALLERGY REACTION"     Review of Systems  patient denies chest pain, shortness of breath, orthopnea. Denies lower extremity edema, abdominal pain, change in appetite, change in bowel movements. Patient denies rashes, musculoskeletal complaints. No other specific complaints in a complete review of systems.      Objective:   Physical Exam   Well-developed well-nourished female in no acute distress. HEENT exam atraumatic, normocephalic, extraocular muscles are intact. Neck is supple. No jugular venous distention no thyromegaly. Chest clear to auscultation without increased work of breathing. Cardiac exam S1 and S2 are regular. Abdominal exam active bowel sounds, soft, nontender. Extremities no edema.     Assessment & Plan:

## 2011-01-14 ENCOUNTER — Other Ambulatory Visit: Payer: Self-pay | Admitting: *Deleted

## 2011-01-14 MED ORDER — CIPROFLOXACIN HCL 500 MG PO TABS: 500.0000 mg | ORAL_TABLET | Freq: Two times a day (BID) | ORAL | Status: AC

## 2011-01-20 ENCOUNTER — Telehealth: Payer: Self-pay | Admitting: Internal Medicine

## 2011-01-20 NOTE — Telephone Encounter (Signed)
Pt called and said that she is still having continuing to have a lot of pain in rt side and back. Pt is req Dr Cato Mulligan or his nurse to call her back asap this a.m.

## 2011-01-22 NOTE — Telephone Encounter (Signed)
Per Dr Cato Mulligan ok to proceed with CT Abd and pelvis.  Pt's husband is in the hospital and she is going to wait till Friday.  She is feeling some better but if she needs the CT she will call back.

## 2011-01-22 NOTE — Telephone Encounter (Signed)
LMTCB

## 2011-01-28 ENCOUNTER — Other Ambulatory Visit: Payer: Self-pay | Admitting: Internal Medicine

## 2011-01-28 DIAGNOSIS — R109 Unspecified abdominal pain: Secondary | ICD-10-CM

## 2011-01-28 NOTE — Progress Notes (Signed)
Pt called back and wants to go ahead with CT of Abd and pelvis.  Order was put in and pt aware we will call back with appt.  Ok to l/m

## 2011-01-29 ENCOUNTER — Other Ambulatory Visit: Payer: Self-pay | Admitting: Internal Medicine

## 2011-01-29 DIAGNOSIS — R109 Unspecified abdominal pain: Secondary | ICD-10-CM

## 2011-01-29 DIAGNOSIS — M545 Low back pain, unspecified: Secondary | ICD-10-CM

## 2011-01-30 ENCOUNTER — Other Ambulatory Visit (INDEPENDENT_AMBULATORY_CARE_PROVIDER_SITE_OTHER): Payer: 59

## 2011-01-30 DIAGNOSIS — R109 Unspecified abdominal pain: Secondary | ICD-10-CM

## 2011-01-30 LAB — BASIC METABOLIC PANEL
CO2: 29 mEq/L (ref 19–32)
Calcium: 9 mg/dL (ref 8.4–10.5)
Chloride: 102 mEq/L (ref 96–112)
Sodium: 138 mEq/L (ref 135–145)

## 2011-02-03 ENCOUNTER — Other Ambulatory Visit: Payer: Self-pay | Admitting: Internal Medicine

## 2011-02-05 ENCOUNTER — Ambulatory Visit (INDEPENDENT_AMBULATORY_CARE_PROVIDER_SITE_OTHER)
Admission: RE | Admit: 2011-02-05 | Discharge: 2011-02-05 | Disposition: A | Discharge status: Home / Self Care | Payer: Medicare HMO | Source: Ambulatory Visit | Attending: Internal Medicine | Admitting: Internal Medicine

## 2011-02-05 DIAGNOSIS — M545 Low back pain, unspecified: Secondary | ICD-10-CM

## 2011-02-05 MED ORDER — IOHEXOL 300 MG/ML  SOLN
100.0000 mL | Freq: Once | INTRAMUSCULAR | Status: AC | PRN
  Administered 2011-02-05: 100 mL via INTRAVENOUS

## 2011-03-01 ENCOUNTER — Other Ambulatory Visit: Payer: Self-pay | Admitting: Internal Medicine

## 2011-03-12 ENCOUNTER — Other Ambulatory Visit: Payer: Self-pay | Admitting: Internal Medicine

## 2011-03-12 DIAGNOSIS — Z1231 Encounter for screening mammogram for malignant neoplasm of breast: Secondary | ICD-10-CM

## 2011-03-24 ENCOUNTER — Ambulatory Visit (INDEPENDENT_AMBULATORY_CARE_PROVIDER_SITE_OTHER): Payer: Medicare HMO

## 2011-03-24 DIAGNOSIS — Z23 Encounter for immunization: Secondary | ICD-10-CM

## 2011-04-02 ENCOUNTER — Ambulatory Visit
Admission: RE | Admit: 2011-04-02 | Discharge: 2011-04-02 | Disposition: A | Discharge status: Home / Self Care | Payer: Medicare HMO | Source: Ambulatory Visit | Attending: Internal Medicine | Admitting: Internal Medicine

## 2011-04-02 DIAGNOSIS — Z1231 Encounter for screening mammogram for malignant neoplasm of breast: Secondary | ICD-10-CM

## 2011-04-03 ENCOUNTER — Encounter: Payer: Self-pay | Admitting: *Deleted

## 2011-04-03 ENCOUNTER — Encounter: Payer: Medicare HMO | Attending: Internal Medicine | Admitting: *Deleted

## 2011-04-03 DIAGNOSIS — R7309 Other abnormal glucose: Secondary | ICD-10-CM

## 2011-04-03 DIAGNOSIS — Z713 Dietary counseling and surveillance: Secondary | ICD-10-CM | POA: Insufficient documentation

## 2011-04-03 DIAGNOSIS — E119 Type 2 diabetes mellitus without complications: Secondary | ICD-10-CM

## 2011-04-03 NOTE — Patient Instructions (Signed)
Goals:  Follow Diabetes Meal Plan as instructed  Eat 3 meals and snacks if hungry  Limit carbohydrate intake to 30-45 grams carbohydrate/meal  Limit carbohydrate intake to 15 grams carbohydrate/snack  Add lean protein foods to meals/snacks  Monitor glucose levels as instructed by your doctor  Aim for 10-15 mins of physical activity daily as able with ill husband  Bring food record and glucose log to your next nutrition visit

## 2011-04-03 NOTE — Progress Notes (Signed)
  Medical Nutrition Therapy:  Appt start time: 0930 end time:  1030.   Assessment:  Primary concerns today: She is frustrated with inability to control her weight and her BGs have been higher lately. Her husband has Huntington's Disease and cannot be left alone. Her daughter stays with her husband one day a week to give the patient time to get appointments and shopping done. She tries to choose lower calorie foods, but her activity level is quite limited.   MEDICATIONS: see list. No diabetes meds at this time   DIETARY INTAKE:  Usual eating pattern includes 3 meals and 0-2 snacks per day.  Everyday foods include good variety of most food groups.  Avoided foods include chips.    24-hr recall:  B ( AM): special K cereal with skim milk   Snk ( AM): none  L ( PM): 1/2 sandwich in restaurant Snk ( PM): none D ( PM): lean meat, vegetables and occasional starch Snk ( PM): none Beverages: skim milk, tea, water  Usual physical activity: none, she cannot leave her husband unattended and he cannot walk very fast  Estimated energy needs: 1200 calories 135 g carbohydrates 90 g protein 33 g fat  Progress Towards Goal(s):  In progress.   Nutritional Diagnosis:  NI-1.5 Excessive energy intake As related to control of diabetes and weight management.  As evidenced by BMI of 34.2 and A1c of 8.6% in August .    Intervention:  Nutrition counseling to assess current eating and activity habits in relation to her need to care for her husband. Encouraged her to reach out to a Child psychotherapist to see what resources she can obtain in caring for her husband so she can take better care of herself. She expressed interest in joining [employer] for the support and guidance it would provide her, which I encouraged. I also suggested she test her BG every other day to collect more data rather than once a week, and to alternate between pre and post meal times.  Handouts given during visit include:  [employer] handout  Monitoring/Evaluation:  Dietary intake, exercise, BG Logs, and body weight in 4 week(s).

## 2011-04-16 ENCOUNTER — Encounter: Payer: Self-pay | Admitting: Gastroenterology

## 2011-04-21 ENCOUNTER — Telehealth: Payer: Self-pay | Admitting: Internal Medicine

## 2011-04-21 ENCOUNTER — Telehealth: Payer: Self-pay | Admitting: *Deleted

## 2011-04-21 MED ORDER — METFORMIN HCL 500 MG PO TABS: 500.0000 mg | ORAL_TABLET | Freq: Two times a day (BID) | ORAL | Status: DC

## 2011-04-21 NOTE — Telephone Encounter (Signed)
Pt called and said that she is having problems with high blood. Fasting blood sugar was 137 this a.m. Pt said that she said that she ate what the nutritionist recommended pt to eat for breakfast and her sugar spiked to 285. Pt is very concerned. She said that she is under a lot of stress right now, due to spouses illness. Pt req call back from nurse asap today.

## 2011-04-21 NOTE — Telephone Encounter (Signed)
Probably should start medication Metformin 500 mg po bid #60/3 refills

## 2011-04-21 NOTE — Telephone Encounter (Signed)
Rx sent and I spoke with the patient.

## 2011-04-21 NOTE — Telephone Encounter (Signed)
Addended by: Alfred Levins D on: 04/21/2011 02:36 PM   Modules accepted: Orders

## 2011-04-22 ENCOUNTER — Other Ambulatory Visit: Payer: Self-pay | Admitting: *Deleted

## 2011-04-22 MED ORDER — METFORMIN HCL 500 MG PO TABS: 500.0000 mg | ORAL_TABLET | Freq: Two times a day (BID) | ORAL | Status: DC

## 2011-05-02 ENCOUNTER — Other Ambulatory Visit: Payer: Self-pay | Admitting: Internal Medicine

## 2011-05-08 ENCOUNTER — Ambulatory Visit: Payer: Medicare HMO | Admitting: *Deleted

## 2011-06-09 ENCOUNTER — Encounter: Payer: Self-pay | Admitting: Gastroenterology

## 2011-06-18 ENCOUNTER — Encounter: Payer: Self-pay | Admitting: Gastroenterology

## 2011-06-18 ENCOUNTER — Ambulatory Visit (INDEPENDENT_AMBULATORY_CARE_PROVIDER_SITE_OTHER): Payer: Medicare Other | Admitting: Gastroenterology

## 2011-06-18 VITALS — BP 136/78 | HR 64 | Ht 67.0 in | Wt 217.0 lb

## 2011-06-18 DIAGNOSIS — Z860100 Personal history of colon polyps, unspecified: Secondary | ICD-10-CM | POA: Insufficient documentation

## 2011-06-18 NOTE — Progress Notes (Signed)
History of Present Illness: Amy [name] is a pleasant 78 year old white female with history of diabetes and colon polyps referred at the request of Dr. Cato Mulligan for followup colonoscopy. Index polypectomy for an adenomatous polyp was in 2002. Colonoscopy in 2005 demonstrated diverticulosis and hemorrhoids. She has no complaints including change of bowel habits, abdominal pain, melena or hematochezia.    Past Medical History  Diagnosis Date  . NEOPLASM, SKIN, UNCERTAIN BEHAVIOR 01/11/2010  . HYPOTHYROIDISM 04/13/2007  . DIABETES MELLITUS, TYPE II 05/22/2009  . HYPERTENSION 04/13/2007  . ABSCESS 12/18/2009  . HYPERGLYCEMIA 05/22/2009  . Diverticulosis   . Hemorrhoids   . Colon polyps    Past Surgical History  Procedure Date  . Appendectomy   . Cholecystectomy   . Abdominal hysterectomy   . Thyroidectomy     secondary to nodule, no cancer  . Tonsillectomy and adenoidectomy   . Oophorectomy    family history includes Hypertension in her mother and Stroke in her father.  There is no history of Colon cancer. Current Outpatient Prescriptions  Medication Sig Dispense Refill  . amLODipine (NORVASC) 5 MG tablet TAKE 1 TABLET EVERY DAY  30 tablet  4  . aspirin 81 MG tablet Take 81 mg by mouth daily.        Marland Kitchen LEVOXYL 150 MCG tablet TAKE 1 TABLET BY MOUTH EVERY DAY  90 tablet  0  . metFORMIN (GLUCOPHAGE) 500 MG tablet Take 1 tablet (500 mg total) by mouth 2 (two) times daily with a meal.  60 tablet  3  . traMADol (ULTRAM) 50 MG tablet Take 1 tablet (50 mg total) by mouth every 6 (six) hours as needed.  60 tablet  3   Allergies as of 06/18/2011 - Review Complete 06/18/2011  Allergen Reaction Noted  . Diclofenac sodium  04/13/2007  . Piroxicam  06/29/2006    reports that she has never smoked. She has never used smokeless tobacco. She reports that she does not drink alcohol or use illicit drugs.     Review of Systems: Pertinent positive and negative review of systems were noted in the above  HPI section. All other review of systems were otherwise negative.  Vital signs were reviewed in today's medical record Physical Exam: General: Well developed , well nourished, no acute distress Head: Normocephalic and atraumatic Eyes:  sclerae anicteric, EOMI Ears: Normal auditory acuity Mouth: No deformity or lesions Neck: Supple, no masses or thyromegaly Lungs: Clear throughout to auscultation Heart: Regular rate and rhythm; no murmurs, rubs or bruits Abdomen: Soft, non tender and non distended. No masses, hepatosplenomegaly or hernias noted. Normal Bowel sounds Rectal:deferred Musculoskeletal: Symmetrical with no gross deformities  Skin: No lesions on visible extremities Pulses:  Normal pulses noted Extremities: No clubbing, cyanosis, edema or deformities noted Neurological: Alert oriented x 4, grossly nonfocal Cervical Nodes:  No significant cervical adenopathy Inguinal Nodes: No significant inguinal adenopathy Psychological:  Alert and cooperative. Normal mood and affect

## 2011-06-18 NOTE — Assessment & Plan Note (Signed)
Plan followup colonoscopy 

## 2011-06-18 NOTE — Patient Instructions (Signed)
It has been recommended by Dr Arlyce Dice for you to schedule a colonoscopy Call the office at [phone] when you are ready to schedule

## 2011-08-15 ENCOUNTER — Other Ambulatory Visit: Payer: Self-pay | Admitting: *Deleted

## 2011-08-15 MED ORDER — METFORMIN HCL 500 MG PO TABS: 500.0000 mg | ORAL_TABLET | Freq: Two times a day (BID) | ORAL | Status: DC

## 2011-11-14 ENCOUNTER — Other Ambulatory Visit: Payer: Self-pay | Admitting: Internal Medicine

## 2011-11-14 NOTE — Telephone Encounter (Signed)
Done

## 2011-12-03 ENCOUNTER — Ambulatory Visit (INDEPENDENT_AMBULATORY_CARE_PROVIDER_SITE_OTHER): Payer: Medicare Other | Admitting: *Deleted

## 2011-12-03 ENCOUNTER — Ambulatory Visit: Payer: Medicare Other | Admitting: *Deleted

## 2011-12-03 DIAGNOSIS — Z23 Encounter for immunization: Secondary | ICD-10-CM

## 2011-12-03 DIAGNOSIS — Z2911 Encounter for prophylactic immunotherapy for respiratory syncytial virus (RSV): Secondary | ICD-10-CM

## 2012-01-27 ENCOUNTER — Other Ambulatory Visit (INDEPENDENT_AMBULATORY_CARE_PROVIDER_SITE_OTHER): Payer: Medicare Other

## 2012-01-27 DIAGNOSIS — N390 Urinary tract infection, site not specified: Secondary | ICD-10-CM

## 2012-01-27 LAB — POCT URINALYSIS DIPSTICK
Blood, UA: NEGATIVE
Nitrite, UA: NEGATIVE
Protein, UA: NEGATIVE
Spec Grav, UA: 1.02
Urobilinogen, UA: 0.2
pH, UA: 5.5

## 2012-01-28 ENCOUNTER — Other Ambulatory Visit: Payer: Self-pay | Admitting: *Deleted

## 2012-01-28 MED ORDER — CIPROFLOXACIN HCL 250 MG PO TABS: 250.0000 mg | ORAL_TABLET | Freq: Two times a day (BID) | ORAL | Status: AC

## 2012-02-17 ENCOUNTER — Ambulatory Visit (INDEPENDENT_AMBULATORY_CARE_PROVIDER_SITE_OTHER): Payer: Medicare Other | Admitting: Internal Medicine

## 2012-02-17 ENCOUNTER — Ambulatory Visit: Payer: Medicare Other | Admitting: Internal Medicine

## 2012-02-17 DIAGNOSIS — Z23 Encounter for immunization: Secondary | ICD-10-CM

## 2012-02-26 ENCOUNTER — Encounter: Payer: Self-pay | Admitting: Gastroenterology

## 2012-04-19 ENCOUNTER — Ambulatory Visit (INDEPENDENT_AMBULATORY_CARE_PROVIDER_SITE_OTHER): Payer: Medicare Other | Admitting: Family

## 2012-04-19 ENCOUNTER — Encounter: Payer: Self-pay | Admitting: Family

## 2012-04-19 VITALS — BP 156/80 | HR 86 | Temp 97.7°F

## 2012-04-19 DIAGNOSIS — L089 Local infection of the skin and subcutaneous tissue, unspecified: Secondary | ICD-10-CM

## 2012-04-19 DIAGNOSIS — M5430 Sciatica, unspecified side: Secondary | ICD-10-CM

## 2012-04-19 DIAGNOSIS — S61409A Unspecified open wound of unspecified hand, initial encounter: Secondary | ICD-10-CM

## 2012-04-19 MED ORDER — CYCLOBENZAPRINE HCL 5 MG PO TABS: 5.0000 mg | ORAL_TABLET | Freq: Three times a day (TID) | ORAL | Status: DC | PRN

## 2012-04-19 MED ORDER — CEPHALEXIN 500 MG PO TABS: 500.0000 mg | ORAL_TABLET | Freq: Three times a day (TID) | ORAL | Status: DC

## 2012-04-19 NOTE — Patient Instructions (Addendum)
The patient is instructed to continue all medications as prescribed. Schedule followup with check out clerk upon leaving the clinic  

## 2012-04-19 NOTE — Progress Notes (Signed)
Subjective:    Patient ID: Amy [name], female    DOB: 12-03-1929, 78 y.o.   MRN: [id number]  HPI 78 year old white female, nonsmoker, patient of Dr. Cato Mulligan is in today with complaints of a laceration to her right hand x4 days ago. It appeared to be healing well, using Neosporin, but she's began to have redness and swelling to her right hand. He become more tender to touch. Patient reports getting her hand caught on the edge of a car door. She denies any numbness or tingling.  Patient has concerns of pain in her right gluteal area times one day. Pain is worse with bending. Rates it a 6/10, described as achy. She's been using ibuprofen and he little to no relief.   Review of Systems  Constitutional: Negative.   Respiratory: Negative.   Cardiovascular: Negative.   Gastrointestinal: Negative.   Musculoskeletal: Positive for back pain. Negative for joint swelling and gait problem.  Skin:       Laceration to the right hand. Red and tender  Neurological: Negative.   Hematological: Negative.   Psychiatric/Behavioral: Negative.    Past Medical History  Diagnosis Date  . NEOPLASM, SKIN, UNCERTAIN BEHAVIOR 01/11/2010  . HYPOTHYROIDISM 04/13/2007  . DIABETES MELLITUS, TYPE II 05/22/2009  . HYPERTENSION 04/13/2007  . ABSCESS 12/18/2009  . HYPERGLYCEMIA 05/22/2009  . Diverticulosis   . Hemorrhoids   . Colon polyps     History   Social History  . Marital Status: Married    Spouse Name: N/A    Number of Children: 3  . Years of Education: N/A   Occupational History  . Retired    Social History Main Topics  . Smoking status: Never Smoker   . Smokeless tobacco: Never Used  . Alcohol Use: No  . Drug Use: No  . Sexually Active: Not on file   Other Topics Concern  . Not on file   Social History Narrative   Daily caffeine     Past Surgical History  Procedure Date  . Appendectomy   . Cholecystectomy   . Abdominal hysterectomy   . Thyroidectomy     secondary to nodule, no  cancer  . Tonsillectomy and adenoidectomy   . Oophorectomy     Family History  Problem Relation Age of Onset  . Hypertension Mother   . Stroke Father   . Colon cancer Neg Hx     Allergies  Allergen Reactions  . Diclofenac Sodium     REACTION: nausea  . Piroxicam     REACTION: "SEVERE ALLERGY REACTION"    Current Outpatient Prescriptions on File Prior to Visit  Medication Sig Dispense Refill  . amLODipine (NORVASC) 5 MG tablet TAKE 1 TABLET EVERY DAY  30 tablet  5  . aspirin 81 MG tablet Take 81 mg by mouth daily.        Marland Kitchen LEVOXYL 150 MCG tablet TAKE 1 TABLET BY MOUTH EVERY DAY  90 tablet  0  . metFORMIN (GLUCOPHAGE) 500 MG tablet Take 1 tablet (500 mg total) by mouth 2 (two) times daily with a meal.  180 tablet  2  . traMADol (ULTRAM) 50 MG tablet Take 1 tablet (50 mg total) by mouth every 6 (six) hours as needed.  60 tablet  3    BP 156/80  Pulse 86  Temp 97.7 F (36.5 C) (Oral)  SpO2 97%chart    Objective:   Physical Exam  Constitutional: She is oriented to person, place, and time. She appears well-developed and well-nourished.  Cardiovascular: Normal rate, regular rhythm and normal heart sounds.   Pulmonary/Chest: Effort normal and breath sounds normal.  Abdominal: Soft. Bowel sounds are normal.  Musculoskeletal: Normal range of motion. She exhibits no edema and no tenderness.  Neurological: She is alert and oriented to person, place, and time. She has normal reflexes. She displays normal reflexes. No cranial nerve deficit. Coordination normal.  Skin: Skin is warm and dry. There is erythema.       Horseshoe shaped healing laceration noted to the right dorsal hand, red, tender to touch. No drainage or discharge.   Psychiatric: She has a normal mood and affect.          Assessment & Plan:  Assessment: Laceration right hand-infected, sciatica  Plan: Cephalexin 500 mg one tablet 3 times a day x5 days. Flexeril 5 mg one tablet 3 times a day as needed for back  pain. Continue heat to the affected area. Patient to call the office if symptoms worsen or persist. Recheck a schedule, and when necessary.

## 2012-06-22 ENCOUNTER — Other Ambulatory Visit: Payer: Self-pay | Admitting: Internal Medicine

## 2012-07-05 ENCOUNTER — Encounter: Payer: Self-pay | Admitting: Internal Medicine

## 2012-07-05 ENCOUNTER — Ambulatory Visit (INDEPENDENT_AMBULATORY_CARE_PROVIDER_SITE_OTHER)
Admission: RE | Admit: 2012-07-05 | Discharge: 2012-07-05 | Disposition: A | Discharge status: Home / Self Care | Payer: Medicare Other | Source: Ambulatory Visit | Attending: Internal Medicine | Admitting: Internal Medicine

## 2012-07-05 ENCOUNTER — Ambulatory Visit (INDEPENDENT_AMBULATORY_CARE_PROVIDER_SITE_OTHER): Payer: Medicare Other | Admitting: Internal Medicine

## 2012-07-05 ENCOUNTER — Other Ambulatory Visit: Payer: Self-pay | Admitting: Internal Medicine

## 2012-07-05 VITALS — BP 110/64 | HR 96 | Temp 98.1°F | Ht 67.0 in | Wt 202.8 lb

## 2012-07-05 DIAGNOSIS — S6990XA Unspecified injury of unspecified wrist, hand and finger(s), initial encounter: Secondary | ICD-10-CM

## 2012-07-05 DIAGNOSIS — S6991XA Unspecified injury of right wrist, hand and finger(s), initial encounter: Secondary | ICD-10-CM

## 2012-07-05 DIAGNOSIS — S62609A Fracture of unspecified phalanx of unspecified finger, initial encounter for closed fracture: Secondary | ICD-10-CM

## 2012-07-05 NOTE — Patient Instructions (Signed)
Finger Fracture  Fractures of fingers are breaks in the bones of the fingers. There are many types of fractures. There are different ways of treating these fractures, all of which can be correct. Your caregiver will discuss the best way to treat your fracture.  TREATMENT   Finger fractures can be treated with:    Non-reduction - this means the bones are in place. The finger is splinted without changing the positions of the bone pieces. The splint is usually left on for about a week to ten days. This will depend on your fracture and what your caregiver thinks.   Closed reduction - the bones are put back into position without using surgery. The finger is then splinted.   ORIF (open reduction and internal fixation) - the fracture site is opened. Then the bone pieces are fixed into place with pins or some type of hardware. This is seldom required. It depends on the severity of the fracture.  Your caregiver will discuss the type of fracture you have and the treatment that will be best for that problem. If surgery is the treatment of choice, the following is information for you to know and also let your caregiver know about prior to surgery.  LET YOUR CAREGIVER KNOW ABOUT:   Allergies   Medications taken including herbs, eye drops, over the counter medications, and creams   Use of steroids (by mouth or creams)   Previous problems with anesthetics or Novocaine   Possibility of pregnancy, if this applies   History of blood clots (thrombophlebitis)   History of bleeding or blood problems   Previous surgery   Other health problems  AFTER THE PROCEDURE  After surgery, you will be taken to the recovery area where a nurse will check your progress. Once you're awake, stable, and taking fluids well, barring other problems you will be allowed to go home. Once home an ice pack applied to your operative site may help with discomfort and keep the swelling down.  HOME CARE INSTRUCTIONS    Follow your caregiver's  instructions as to activities, exercises, physical therapy, and driving a car.   Use your finger and exercise as directed.   Only take over-the-counter or prescription medicines for pain, discomfort, or fever as directed by your caregiver. Do not take aspirin until your caregiver OK's it, as this can increase bleeding immediately following surgery.   Stop using ibuprofen if it upsets your stomach. Let your caregiver know about it.  SEEK MEDICAL CARE IF:   You have increased bleeding (more than a small spot) from the wound or from beneath your splint.   You develop redness, swelling, or increasing pain in the wound or from beneath your splint.   There is pus coming from the wound or from beneath your splint.   An unexplained oral temperature above 102 F (38.9 C) develops, or as your caregiver suggests.   There is a foul smell coming from the wound or dressing or from beneath your splint.  SEEK IMMEDIATE MEDICAL CARE IF:    You develop a rash.   You have difficulty breathing.   You have any allergic problems.  MAKE SURE YOU:    Understand these instructions.   Will watch your condition.   Will get help right away if you are not doing well or get worse.  Document Released: 08/24/2000 Document Revised: 08/04/2011 Document Reviewed: 12/30/2007  [employer] Patient Information 2013 [employer]

## 2012-07-05 NOTE — Progress Notes (Signed)
Subjective:    Patient ID: Amy [name], female    DOB: 12-31-1929, 79 y.o.   MRN: [id number]  HPI  Pt presents to the clinic today with c/o right hand injury. This occurred 6 weeks ago when she slammed her right pinky finger in a car door. She did not feel the need to have it evaluated at that time. Since that time, she has increased pain with ROM and has also noticed some swelling. There has not been any laceration or bruising to the skin. She has never broken anything before. She is requesting an xray today.  Review of Systems  Past Medical History  Diagnosis Date  . NEOPLASM, SKIN, UNCERTAIN BEHAVIOR 01/11/2010  . HYPOTHYROIDISM 04/13/2007  . DIABETES MELLITUS, TYPE II 05/22/2009  . HYPERTENSION 04/13/2007  . ABSCESS 12/18/2009  . HYPERGLYCEMIA 05/22/2009  . Diverticulosis   . Hemorrhoids   . Colon polyps     Current Outpatient Prescriptions  Medication Sig Dispense Refill  . amLODipine (NORVASC) 5 MG tablet TAKE 1 TABLET EVERY DAY  30 tablet  5  . aspirin 81 MG tablet Take 81 mg by mouth daily.        Marland Kitchen LEVOXYL 150 MCG tablet TAKE 1 TABLET BY MOUTH EVERY DAY  90 tablet  0  . metFORMIN (GLUCOPHAGE) 500 MG tablet Take 1 tablet (500 mg total) by mouth 2 (two) times daily with a meal.  180 tablet  2  . traMADol (ULTRAM) 50 MG tablet Take 1 tablet (50 mg total) by mouth every 6 (six) hours as needed.  60 tablet  3  . cyclobenzaprine (FLEXERIL) 5 MG tablet Take 1 tablet (5 mg total) by mouth 3 (three) times daily as needed for muscle spasms.  30 tablet  0   No current facility-administered medications for this visit.    Allergies  Allergen Reactions  . Diclofenac Sodium     REACTION: nausea  . Piroxicam     REACTION: "SEVERE ALLERGY REACTION"    Family History  Problem Relation Age of Onset  . Hypertension Mother   . Stroke Father   . Colon cancer Neg Hx     History   Social History  . Marital Status: Married    Spouse Name: N/A    Number of Children: 3  .  Years of Education: N/A   Occupational History  . Retired    Social History Main Topics  . Smoking status: Never Smoker   . Smokeless tobacco: Never Used  . Alcohol Use: No  . Drug Use: No  . Sexually Active: Not on file   Other Topics Concern  . Not on file   Social History Narrative   Daily caffeine      Constitutional: Denies fever, malaise, fatigue, headache or abrupt weight changes.  Musculoskeletal: Pt reports right pinky finger pain and swelling. Denies decrease in range of motion, difficulty with gait, muscle pain.    No other specific complaints in a complete review of systems (except as listed in HPI above).     Objective:   Physical Exam   BP 110/64  Pulse 96  Temp(Src) 98.1 F (36.7 C) (Oral)  Ht 5\' 7"  (1.702 m)  Wt 202 lb 12.8 oz (91.989 kg)  BMI 31.76 kg/m2  SpO2 96% Wt Readings from Last 3 Encounters:  07/05/12 202 lb 12.8 oz (91.989 kg)  06/18/11 217 lb (98.431 kg)  04/03/11 217 lb 14.4 oz (98.839 kg)    General: Appears her stated age, well developed,  well nourished in NAD. Cardiovascular: Normal rate and rhythm. S1,S2 noted.  No murmur, rubs or gallops noted. No JVD or BLE edema. No carotid bruits noted. Pulmonary/Chest: Normal effort and positive vesicular breath sounds. No respiratory distress. No wheezes, rales or ronchi noted.  Musculoskeletal: Decreased active range of motion of the right pinky secondary to pain. No signs of joint swelling. No difficulty with gait.        Assessment & Plan:   Right hand injury, new onset with additional workup required:  Take your tramadol for pain Place ice on the affected area when it swells Will obtain xray of right hand to r/o fracture May need referral to ortho or hand specialist  Will f/u after results of xray come back

## 2012-07-05 NOTE — Progress Notes (Signed)
Mildly displaced fracture of the medial/ulnar aspect of the fifth middle phalanx

## 2012-10-26 ENCOUNTER — Other Ambulatory Visit: Payer: Self-pay | Admitting: Internal Medicine

## 2012-10-26 NOTE — Telephone Encounter (Signed)
You haven't seen pt since 2012

## 2012-12-17 ENCOUNTER — Other Ambulatory Visit: Payer: Self-pay | Admitting: Internal Medicine

## 2012-12-25 ENCOUNTER — Other Ambulatory Visit: Payer: Self-pay | Admitting: Internal Medicine

## 2012-12-28 ENCOUNTER — Other Ambulatory Visit: Payer: Self-pay | Admitting: *Deleted

## 2012-12-28 MED ORDER — AMLODIPINE BESYLATE 5 MG PO TABS: ORAL_TABLET | ORAL | Status: DC

## 2013-01-16 ENCOUNTER — Emergency Department (INDEPENDENT_AMBULATORY_CARE_PROVIDER_SITE_OTHER): Payer: Medicare Other

## 2013-01-16 ENCOUNTER — Encounter (HOSPITAL_COMMUNITY): Payer: Self-pay | Admitting: *Deleted

## 2013-01-16 ENCOUNTER — Emergency Department (HOSPITAL_COMMUNITY)
Admission: EM | Admit: 2013-01-16 | Discharge: 2013-01-16 | Disposition: A | Discharge status: Home / Self Care | Payer: Medicare Other | Source: Home / Self Care | Attending: Emergency Medicine | Admitting: Emergency Medicine

## 2013-01-16 DIAGNOSIS — R112 Nausea with vomiting, unspecified: Secondary | ICD-10-CM

## 2013-01-16 DIAGNOSIS — M549 Dorsalgia, unspecified: Secondary | ICD-10-CM

## 2013-01-16 DIAGNOSIS — R109 Unspecified abdominal pain: Secondary | ICD-10-CM

## 2013-01-16 LAB — POCT URINALYSIS DIP (DEVICE)
Bilirubin Urine: NEGATIVE
Glucose, UA: NEGATIVE mg/dL
Hgb urine dipstick: NEGATIVE
Ketones, ur: NEGATIVE mg/dL
Nitrite: NEGATIVE
pH: 6 (ref 5.0–8.0)

## 2013-01-16 LAB — POCT I-STAT, CHEM 8
BUN: 15 mg/dL (ref 6–23)
Chloride: 104 mEq/L (ref 96–112)
Glucose, Bld: 126 mg/dL — ABNORMAL HIGH (ref 70–99)
HCT: 44 % (ref 36.0–46.0)
Potassium: 4.4 mEq/L (ref 3.5–5.1)

## 2013-01-16 MED ORDER — TRAMADOL HCL 50 MG PO TABS: 50.0000 mg | ORAL_TABLET | Freq: Four times a day (QID) | ORAL | Status: DC | PRN

## 2013-01-16 NOTE — ED Notes (Signed)
Patient complains of lower back pain and nausea that started a week ago; patient states she is worried about kidney stones. Denies fever/chills.

## 2013-01-16 NOTE — ED Provider Notes (Signed)
CSN: [id number]     Arrival date & time 01/16/13  1155 History     First MD Initiated Contact with Patient 01/16/13 1333     Chief Complaint  Patient presents with  . Nausea  . Back Pain   (Consider location/radiation/quality/duration/timing/severity/associated sxs/prior Treatment) HPI Comments: 79 year old female presents complaining of right flank pain for the past week and nausea for the past few days. She has a constant pain in her right flank and states she has not slept well all week because she cannot get comfortable. She eventually took some hydrocodone that she had from the dizziness and the next day she vomited throughout the day. She has felt nauseous ever since then. She states that her son has kidney stones and they often seem like she feels right now but she is personally never had one. She denies fever, chest pain, diarrhea, shortness of breath, diaphoresis. She has never experienced anything like this before. No hematuria or dysuria  Patient is a 79 y.o. female presenting with back pain.  Back Pain Associated symptoms: no abdominal pain, no chest pain, no dysuria, no fever and no weakness     Past Medical History  Diagnosis Date  . NEOPLASM, SKIN, UNCERTAIN BEHAVIOR 01/11/2010  . HYPOTHYROIDISM 04/13/2007  . DIABETES MELLITUS, TYPE II 05/22/2009  . HYPERTENSION 04/13/2007  . ABSCESS 12/18/2009  . HYPERGLYCEMIA 05/22/2009  . Diverticulosis   . Hemorrhoids   . Colon polyps    Past Surgical History  Procedure Laterality Date  . Appendectomy    . Cholecystectomy    . Abdominal hysterectomy    . Thyroidectomy      secondary to nodule, no cancer  . Tonsillectomy and adenoidectomy    . Oophorectomy     Family History  Problem Relation Age of Onset  . Hypertension Mother   . Stroke Father   . Colon cancer Neg Hx    History  Substance Use Topics  . Smoking status: Never Smoker   . Smokeless tobacco: Never Used  . Alcohol Use: No   OB History   Grav Para Term  Preterm Abortions TAB SAB Ect Mult Living                 Review of Systems  Constitutional: Negative for fever and chills.  Eyes: Negative for visual disturbance.  Respiratory: Negative for cough and shortness of breath.   Cardiovascular: Negative for chest pain, palpitations and leg swelling.  Gastrointestinal: Positive for nausea and vomiting. Negative for abdominal pain.  Endocrine: Negative for polydipsia and polyuria.  Genitourinary: Positive for flank pain. Negative for dysuria, urgency and frequency.  Musculoskeletal: Positive for back pain. Negative for myalgias and arthralgias.  Skin: Negative for rash.  Neurological: Negative for dizziness, weakness and light-headedness.    Allergies  Diclofenac sodium and Piroxicam  Home Medications   Current Outpatient Rx  Name  Route  Sig  Dispense  Refill  . amLODipine (NORVASC) 5 MG tablet      TAKE 1 TABLET EVERY DAY NEEDS OV   30 tablet   0   . aspirin 81 MG tablet   Oral   Take 81 mg by mouth daily.           Marland Kitchen levothyroxine (SYNTHROID, LEVOTHROID) 150 MCG tablet      TAKE 1 TABLET BY MOUTH EVERY DAY   90 tablet   0   . metFORMIN (GLUCOPHAGE) 500 MG tablet      TAKE 1 TABLET (500 MG TOTAL) BY  MOUTH 2 (TWO) TIMES DAILY WITH A MEAL.   60 tablet   0   . cyclobenzaprine (FLEXERIL) 5 MG tablet   Oral   Take 1 tablet (5 mg total) by mouth 3 (three) times daily as needed for muscle spasms.   30 tablet   0   . traMADol (ULTRAM) 50 MG tablet   Oral   Take 1 tablet (50 mg total) by mouth every 6 (six) hours as needed.   20 tablet   3    BP 174/78  Pulse 64  Temp(Src) 98.5 F (36.9 C) (Oral)  SpO2 99% Physical Exam  Nursing note and vitals reviewed. Constitutional: She appears well-developed and well-nourished. No distress.  HENT:  Head: Normocephalic and atraumatic.  Cardiovascular: Normal rate and regular rhythm.  Exam reveals no gallop and no friction rub.   No murmur heard. Pulmonary/Chest: No  respiratory distress. She has no wheezes. She has no rales.  Abdominal: Soft. There is no tenderness. There is no CVA tenderness.  Musculoskeletal:       Thoracic back: She exhibits no tenderness, no bony tenderness, no deformity and no spasm.       Lumbar back: She exhibits no tenderness, no bony tenderness, no deformity and no spasm.  Skin: Skin is warm and dry. No rash noted. She is not diaphoretic.  Multiple nevi across the back  Psychiatric: She has a normal mood and affect. Judgment normal.    ED Course   Procedures (including critical care time)  Labs Reviewed  POCT URINALYSIS DIP (DEVICE) - Abnormal; Notable for the following:    Leukocytes, UA TRACE (*)    All other components within normal limits  POCT I-STAT, CHEM 8 - Abnormal; Notable for the following:    Glucose, Bld 126 (*)    All other components within normal limits   Dg Abd Acute W/chest  01/16/2013   *RADIOLOGY REPORT*  Clinical Data: Nausea, low back pain, abdominal pain for 1 week, nausea, vomiting, history hypertension, diabetes  ACUTE ABDOMEN SERIES (ABDOMEN 2 VIEW & CHEST 1 VIEW)  Comparison: Chest radiograph 06/29/2006  Findings: Upper-normal size of cardiac silhouette. Tortuous aorta with atherosclerotic calcification. Mediastinal contours and pulmonary vascularity otherwise normal. Scarring at lower left chest laterally. Lungs otherwise clear. No pleural effusion or pneumothorax.  Surgical clips right upper quadrant question cholecystectomy. Increased stool throughout colon. Numerous pelvic phleboliths. No evidence of bowel obstruction, bowel wall thickening, or free intraperitoneal air. Bones appear demineralized with mild degenerative disc disease changes of the lumbar spine. No definite urinary tract calcification.  IMPRESSION: Increased stool in the colon. Otherwise negative exam.   Original Report Authenticated By: Ulyses Southward, M.D.   1. Back pain   2. Flank pain   3. Nausea & vomiting     MDM  No clear  cause of this patient's symptoms has beenfound here today.  I have recommended transfer to the ED for further workup and possible CT, she declines and is signing out AMA.    Graylon Good, PA-C 01/16/13 1533  Graylon Good, PA-C 01/16/13 1534

## 2013-01-16 NOTE — ED Provider Notes (Signed)
Medical screening examination/treatment/procedure(s) were performed by non-physician practitioner and as supervising physician I was immediately available for consultation/collaboration.  Leslee Home, M.D.  Reuben Likes, MD 01/16/13 [phone]

## 2013-01-17 LAB — URINE CULTURE

## 2013-01-21 ENCOUNTER — Other Ambulatory Visit: Payer: Self-pay | Admitting: Internal Medicine

## 2013-01-21 MED ORDER — METFORMIN HCL 500 MG PO TABS: ORAL_TABLET | ORAL | Status: DC

## 2013-01-21 NOTE — Addendum Note (Signed)
Addended by: Alfred Levins D on: 01/21/2013 05:01 PM   Modules accepted: Orders

## 2013-02-19 ENCOUNTER — Other Ambulatory Visit: Payer: Self-pay | Admitting: Internal Medicine

## 2013-03-09 ENCOUNTER — Other Ambulatory Visit: Payer: Self-pay | Admitting: Internal Medicine

## 2013-03-26 LAB — HM DIABETES EYE EXAM

## 2013-03-30 ENCOUNTER — Telehealth: Payer: Self-pay

## 2013-03-30 NOTE — Telephone Encounter (Signed)
Returned pt's call about flu shots. No vm; will attempt to call pt at a later time.

## 2013-03-31 NOTE — Telephone Encounter (Signed)
Spoke with pt and she and her husband would like to get flu shots.  Pt has a hard time trying to get her husband out of the car to get a flu shot.  Advised that pt and her husband could be put on the flu clinic schedule for Friday 11/7 and pt could come in and check herself and husband in and then pt can be given flu shot in the car.  Pt's wife is aware.

## 2013-04-01 ENCOUNTER — Ambulatory Visit (INDEPENDENT_AMBULATORY_CARE_PROVIDER_SITE_OTHER): Payer: Medicare Other

## 2013-04-01 DIAGNOSIS — Z23 Encounter for immunization: Secondary | ICD-10-CM

## 2013-04-25 ENCOUNTER — Encounter: Payer: Self-pay | Admitting: Internal Medicine

## 2013-04-25 ENCOUNTER — Ambulatory Visit (INDEPENDENT_AMBULATORY_CARE_PROVIDER_SITE_OTHER): Payer: Medicare Other | Admitting: Internal Medicine

## 2013-04-25 VITALS — BP 142/82 | HR 72 | Temp 98.8°F | Ht 67.0 in | Wt 189.0 lb

## 2013-04-25 DIAGNOSIS — I10 Essential (primary) hypertension: Secondary | ICD-10-CM

## 2013-04-25 DIAGNOSIS — E119 Type 2 diabetes mellitus without complications: Secondary | ICD-10-CM

## 2013-04-25 DIAGNOSIS — E039 Hypothyroidism, unspecified: Secondary | ICD-10-CM

## 2013-04-25 LAB — HEMOGLOBIN A1C: Hgb A1c MFr Bld: 6.8 % — ABNORMAL HIGH (ref 4.6–6.5)

## 2013-04-25 LAB — BASIC METABOLIC PANEL
CO2: 28 mEq/L (ref 19–32)
Calcium: 9.4 mg/dL (ref 8.4–10.5)
Creatinine, Ser: 0.9 mg/dL (ref 0.4–1.2)
GFR: 66.37 mL/min (ref >60.00)
Glucose, Bld: 125 mg/dL — ABNORMAL HIGH (ref 70–99)

## 2013-04-25 LAB — HEPATIC FUNCTION PANEL: Total Bilirubin: 1.2 mg/dL (ref 0.3–1.2)

## 2013-04-25 LAB — LDL CHOLESTEROL, DIRECT: Direct LDL: 142.6 mg/dL

## 2013-04-25 LAB — TSH: TSH: 4.75 u[IU]/mL (ref 0.35–5.50)

## 2013-04-25 NOTE — Assessment & Plan Note (Signed)
It has been quite sometime since she has been evaluated. She needs laboratory work.

## 2013-04-25 NOTE — Progress Notes (Signed)
patient comes in for followup of multiple medical problems including type 2 diabetes, hyperlipidemia, hypertension. The patient does not check blood sugar or blood pressure at home. The patetient does not follow an exercise or diet program. The patient denies any polyuria, polydipsia.  In the past the patient has gone to diabetic treatment center. The patient is tolerating medications  Without difficulty. The patient does admit to medication compliance.   Patient's husband has Huntington's chorea. Neck fairly dominates her social situation. She is putting up with those stressors as well as could be anticipated.  Past Medical History  Diagnosis Date  . NEOPLASM, SKIN, UNCERTAIN BEHAVIOR 01/11/2010  . HYPOTHYROIDISM 04/13/2007  . DIABETES MELLITUS, TYPE II 05/22/2009  . HYPERTENSION 04/13/2007  . ABSCESS 12/18/2009  . HYPERGLYCEMIA 05/22/2009  . Diverticulosis   . Hemorrhoids   . Colon polyps     History   Social History  . Marital Status: Married    Spouse Name: N/A    Number of Children: 3  . Years of Education: N/A   Occupational History  . Retired    Social History Main Topics  . Smoking status: Never Smoker   . Smokeless tobacco: Never Used  . Alcohol Use: No  . Drug Use: No  . Sexual Activity: Not on file   Other Topics Concern  . Not on file   Social History Narrative   Daily caffeine     Past Surgical History  Procedure Laterality Date  . Appendectomy    . Cholecystectomy    . Abdominal hysterectomy    . Thyroidectomy      secondary to nodule, no cancer  . Tonsillectomy and adenoidectomy    . Oophorectomy      Family History  Problem Relation Age of Onset  . Hypertension Mother   . Stroke Father   . Colon cancer Neg Hx     Allergies  Allergen Reactions  . Diclofenac Sodium     REACTION: nausea  . Piroxicam     REACTION: "SEVERE ALLERGY REACTION"    Current Outpatient Prescriptions on File Prior to Visit  Medication Sig Dispense Refill  .  amLODipine (NORVASC) 5 MG tablet TAKE 1 TABLET BY MOUTH EVERY DAY NEED OFFICE VISIT  90 tablet  0  . aspirin 81 MG tablet Take 81 mg by mouth daily.        . cyclobenzaprine (FLEXERIL) 5 MG tablet Take 1 tablet (5 mg total) by mouth 3 (three) times daily as needed for muscle spasms.  30 tablet  0  . levothyroxine (SYNTHROID, LEVOTHROID) 150 MCG tablet TAKE 1 TABLET BY MOUTH EVERY DAY  90 tablet  0  . metFORMIN (GLUCOPHAGE) 500 MG tablet TAKE 1 TABLET BY MOUTH TWICE A DAY WITH MEALS  60 tablet  1  . traMADol (ULTRAM) 50 MG tablet Take 1 tablet (50 mg total) by mouth every 6 (six) hours as needed.  20 tablet  3   No current facility-administered medications on file prior to visit.     patient denies chest pain, shortness of breath, orthopnea. Denies lower extremity edema, abdominal pain, change in appetite, change in bowel movements. Patient denies rashes, musculoskeletal complaints. No other specific complaints in a complete review of systems.   Vitals reviewed  Well-developed well-nourished female in no acute distress. HEENT exam atraumatic, normocephalic, extraocular muscles are intact. Neck is supple. No jugular venous distention no thyromegaly. Chest clear to auscultation without increased work of breathing. Cardiac exam S1 and S2 are regular. Abdominal  exam active bowel sounds, soft, nontender. Extremities no edema. Neurologic exam she is alert without any motor sensory deficits. Gait is normal.

## 2013-04-25 NOTE — Assessment & Plan Note (Signed)
Adequate control. Recent basic metabolic profile is normal.

## 2013-04-25 NOTE — Assessment & Plan Note (Signed)
We'll check TSH today 

## 2013-04-26 LAB — MICROALBUMIN / CREATININE URINE RATIO
Microalb Creat Ratio: 1.5 mg/g (ref 0.0–30.0)
Microalb, Ur: 2.6 mg/dL — ABNORMAL HIGH (ref 0.0–1.9)

## 2013-05-02 ENCOUNTER — Other Ambulatory Visit: Payer: Self-pay | Admitting: *Deleted

## 2013-05-02 MED ORDER — ATORVASTATIN CALCIUM 20 MG PO TABS: 20.0000 mg | ORAL_TABLET | Freq: Every day | ORAL | Status: DC

## 2013-05-02 MED ORDER — METFORMIN HCL 500 MG PO TABS: ORAL_TABLET | ORAL | Status: DC

## 2013-05-02 MED ORDER — LEVOTHYROXINE SODIUM 150 MCG PO TABS: ORAL_TABLET | ORAL | Status: DC

## 2013-06-02 ENCOUNTER — Encounter: Payer: Self-pay | Admitting: Family Medicine

## 2013-06-02 ENCOUNTER — Ambulatory Visit (INDEPENDENT_AMBULATORY_CARE_PROVIDER_SITE_OTHER): Payer: Medicare Other | Admitting: Family Medicine

## 2013-06-02 VITALS — BP 132/70 | HR 97 | Temp 97.7°F

## 2013-06-02 DIAGNOSIS — G5690 Unspecified mononeuropathy of unspecified upper limb: Secondary | ICD-10-CM

## 2013-06-02 DIAGNOSIS — G5692 Unspecified mononeuropathy of left upper limb: Secondary | ICD-10-CM

## 2013-06-02 MED ORDER — GABAPENTIN 100 MG PO CAPS: 100.0000 mg | ORAL_CAPSULE | Freq: Three times a day (TID) | ORAL | Status: DC

## 2013-06-02 NOTE — Progress Notes (Signed)
Pre visit review using our clinic review tool, if applicable. No additional management support is needed unless otherwise documented below in the visit note. 

## 2013-06-02 NOTE — Progress Notes (Signed)
   Subjective:    Patient ID: Amy [name], female    DOB: [birth year], 80 y.o.   MRN: [id number]  HPI Patient seen left hand pain. Onset about 3-4 weeks ago. Distribution is fourth and fifth digits and ulnar distribution. She denies any arm pain. She describes a burning quality which is somewhat off and on. This is severe at times sometimes 9/10 in intensity. She denies any elbow pain. No cervical neck pain. Denies any upper extremity numbness or weakness. She has taken Aleve without much relief. No other alleviating factors. Denies any skin rash. No exacerbating factors. She's had difficulty sleeping at times secondary to pain  She is currently dealing with tremendous stress with her husband who has advanced Huntington's disease and she is having to consider possible skilled care placement  Past Medical History  Diagnosis Date  . NEOPLASM, SKIN, UNCERTAIN BEHAVIOR 7/82/9562  . HYPOTHYROIDISM 04/13/2007  . DIABETES MELLITUS, TYPE II 05/22/2009  . HYPERTENSION 04/13/2007  . ABSCESS 12/18/2009  . HYPERGLYCEMIA 05/22/2009  . Diverticulosis   . Hemorrhoids   . Colon polyps    Past Surgical History  Procedure Laterality Date  . Appendectomy    . Cholecystectomy    . Abdominal hysterectomy    . Thyroidectomy      secondary to nodule, no cancer  . Tonsillectomy and adenoidectomy    . Oophorectomy      reports that she has never smoked. She has never used smokeless tobacco. She reports that she does not drink alcohol or use illicit drugs. family history includes Hypertension in her mother; Stroke in her father. There is no history of Colon cancer. Allergies  Allergen Reactions  . Diclofenac Sodium     REACTION: nausea  . Piroxicam     REACTION: "SEVERE ALLERGY REACTION"      Review of Systems  Constitutional: Negative for fever, chills, appetite change and unexpected weight change.  Respiratory: Negative for shortness of breath.   Cardiovascular: Negative for chest pain.    Musculoskeletal: Negative for back pain.  Neurological: Negative for weakness and numbness.  Hematological: Negative for adenopathy.       Objective:   Physical Exam  Constitutional: She appears well-developed and well-nourished.  Cardiovascular: Normal rate.   Pulmonary/Chest: Effort normal and breath sounds normal. No respiratory distress. She has no wheezes. She has no rales.  Musculoskeletal: She exhibits no edema and no tenderness.  Left hand reveals no edema. No ecchymosis. No erythema. No warmth. No obvious swelling. No localized tenderness. Good distal pulses radial and ulnar  Neurological:  She was full-strength upper extremities but has pain with gripping on the left. No definite weakness. Symmetric upper extremity reflexes. Normal sensory function and touch. She may have some mild hyperesthesia involving left hand  Skin: No rash noted.          Assessment & Plan:  Left hand pain. She is describing ulnar nerve distribution. This sounds more neuropathic. She does not have any rash to suggest obvious shingles. She is not describing cervical radiculopathy pain. Nonfocal neuro exam. Given severity of pain, trial of gabapentin 100 mg each bedtime and slowly titrate to 100 mg 3 times a day over the next week. Reassess in 2 weeks' time and sooner she has a rash or other new symptoms

## 2013-06-02 NOTE — Patient Instructions (Signed)
Start gabapentin 100 mg one at night and may increase to one three times daily (over one week) if pain persists Follow up for any rash or increased weakness or other new symptoms.

## 2013-06-15 ENCOUNTER — Telehealth: Payer: Self-pay | Admitting: Internal Medicine

## 2013-06-15 NOTE — Telephone Encounter (Signed)
Pt seen by dr Elease Hashimoto on 1/8. Instructed to call if med  (gabapentin (NEURONTIN) 100 MG capsule)  did not work for pain in hand. It has NOT helped. Pt would like to know if you want to see her again or refer.to someone else. Pls advise

## 2013-06-15 NOTE — Telephone Encounter (Signed)
Increase gabapentin to 2 at night.  Make sure she has follow up in 1-2 weeks.

## 2013-06-15 NOTE — Telephone Encounter (Signed)
Pt informed

## 2013-07-06 ENCOUNTER — Ambulatory Visit (INDEPENDENT_AMBULATORY_CARE_PROVIDER_SITE_OTHER): Payer: Medicare Other | Admitting: Family Medicine

## 2013-07-06 ENCOUNTER — Encounter: Payer: Self-pay | Admitting: Family Medicine

## 2013-07-06 VITALS — BP 122/64 | Temp 98.2°F

## 2013-07-06 DIAGNOSIS — J069 Acute upper respiratory infection, unspecified: Secondary | ICD-10-CM

## 2013-07-06 NOTE — Progress Notes (Signed)
See scanned doc.

## 2013-07-06 NOTE — Progress Notes (Signed)
Pre visit review using our clinic review tool, if applicable. No additional management support is needed unless otherwise documented below in the visit note. 

## 2013-07-07 ENCOUNTER — Telehealth: Payer: Self-pay | Admitting: Internal Medicine

## 2013-07-07 NOTE — Telephone Encounter (Signed)
Pt was last seen 06/02/13

## 2013-07-07 NOTE — Telephone Encounter (Signed)
Pt saw dr Elease Hashimoto and medication he prescribed is not working. Pt would like md to know ?hand neuropathy is no better. Pt is still having trouble pick things up due to pain

## 2013-07-07 NOTE — Telephone Encounter (Signed)
Confirm dose of gabapentin that she is taking .  We have given her titration regimen and I want to make sure she has titrated up.

## 2013-07-08 NOTE — Telephone Encounter (Signed)
Pt states she takes 1 in the morning, 1 at lunch and 2 at bedtime. She is having a problem picking up thing, and her hand is swollen.

## 2013-07-08 NOTE — Telephone Encounter (Signed)
Let's reassess next week.  Hand edema is a new symptom.

## 2013-07-08 NOTE — Telephone Encounter (Signed)
Pt has been sch

## 2013-07-08 NOTE — Telephone Encounter (Signed)
Can you please set up appt for the patient .

## 2013-07-11 ENCOUNTER — Encounter: Payer: Self-pay | Admitting: Family Medicine

## 2013-07-11 ENCOUNTER — Ambulatory Visit (INDEPENDENT_AMBULATORY_CARE_PROVIDER_SITE_OTHER): Payer: Medicare Other | Admitting: Family Medicine

## 2013-07-11 VITALS — BP 130/78 | HR 65

## 2013-07-11 DIAGNOSIS — M79642 Pain in left hand: Secondary | ICD-10-CM

## 2013-07-11 DIAGNOSIS — M79609 Pain in unspecified limb: Secondary | ICD-10-CM

## 2013-07-11 NOTE — Progress Notes (Signed)
Pre visit review using our clinic review tool, if applicable. No additional management support is needed unless otherwise documented below in the visit note. 

## 2013-07-11 NOTE — Patient Instructions (Signed)
We will call you regarding hand surgeon referral. Increase gabapentin to 200 mg three times daily.

## 2013-07-11 NOTE — Progress Notes (Signed)
   Subjective:    Patient ID: Amy [name], female    DOB: [birth year], 80 y.o.   MRN: [id number]  HPI Patient is seen with persistent left hand pain involving fourth and fifth digits. Refer to prior note:  06-02-13 "Patient seen left hand pain. Onset about 3-4 weeks ago.  Distribution is fourth and fifth digits and ulnar distribution. She denies any arm pain. She describes a burning quality which is somewhat off and on. This is severe at times sometimes 9/10 in intensity. She denies any elbow pain. No cervical neck pain. Denies any upper extremity numbness or weakness. She has taken Aleve without much relief. No other alleviating factors. Denies any skin rash. No exacerbating factors. She's had difficulty sleeping at times secondary to pain."  She had nonfocal neurologic exam and we suspected neuropathy-type symptoms. She mostly complained of a burning sensation in her left fourth and fifth digits. Started gabapentin currently 200 mg at night 100 mg in the morning and again at noon. She is sleeping better and pain has gone from 9/10 severity at its worst to 7/10 severity. No weakness. No neck pain. No left arm pain. Symptoms are worse with gripping.  Patient called couple days ago stating that she had hand swelling but on exam today she denies any hand swelling though she feels her ring finger is slightly swollen  Past Medical History  Diagnosis Date  . NEOPLASM, SKIN, UNCERTAIN BEHAVIOR 09/06/2438  . HYPOTHYROIDISM 04/13/2007  . DIABETES MELLITUS, TYPE II 05/22/2009  . HYPERTENSION 04/13/2007  . ABSCESS 12/18/2009  . HYPERGLYCEMIA 05/22/2009  . Diverticulosis   . Hemorrhoids   . Colon polyps    Past Surgical History  Procedure Laterality Date  . Appendectomy    . Cholecystectomy    . Abdominal hysterectomy    . Thyroidectomy      secondary to nodule, no cancer  . Tonsillectomy and adenoidectomy    . Oophorectomy      reports that she has never smoked. She has never used smokeless  tobacco. She reports that she does not drink alcohol or use illicit drugs. family history includes Hypertension in her mother; Stroke in her father. There is no history of Colon cancer. Allergies  Allergen Reactions  . Diclofenac Sodium     REACTION: nausea  . Piroxicam     REACTION: "SEVERE ALLERGY REACTION"       Review of Systems  Cardiovascular: Negative for chest pain.  Neurological: Negative for weakness and numbness.  Hematological: Negative for adenopathy.       Objective:   Physical Exam  Constitutional: She appears well-developed and well-nourished.  Cardiovascular: Normal rate.   Pulmonary/Chest: Effort normal and breath sounds normal. No respiratory distress. She has no wheezes. She has no rales.  Musculoskeletal: She exhibits no edema.  Left hand reveals no erythema. No warmth. No asymmetric edema compared to the right hand. Full range of motion all digits of the hand. She does not have any localized tenderness involving the fourth or fifth digits. No bony tenderness. Good capillary refill.  Neurological:  Full-strength upper extremities. Symmetric reflexes upper extremities. No muscle atrophy          Assessment & Plan:  Left fourth and fifth digit pain (ulnar nerve distribution). She's not any weakness but has persistent burning type pain which has improved slightly on gabapentin. Titrate gabapentin 200 mg 3 times a day. Set up hand orthopedic referral.

## 2013-07-22 ENCOUNTER — Other Ambulatory Visit: Payer: Self-pay | Admitting: Internal Medicine

## 2013-08-19 ENCOUNTER — Telehealth: Payer: Self-pay

## 2013-08-19 NOTE — Telephone Encounter (Signed)
Pt said she is not doing any better after taking mucinex for 2 weeks . Would like to know if she can get something else //

## 2013-08-21 MED ORDER — FLUTICASONE PROPIONATE 50 MCG/ACT NA SUSP
2.0000 | Freq: Every day | NASAL | Status: DC
Start: 2013-08-21 — End: 2014-08-15

## 2013-08-21 NOTE — Telephone Encounter (Signed)
Send in fluticasone nasal spray- 2 sprays each nostril once daily #1/11 refills

## 2013-08-21 NOTE — Telephone Encounter (Signed)
rx sent in electronically 

## 2013-11-22 ENCOUNTER — Telehealth: Payer: Self-pay | Admitting: Internal Medicine

## 2013-11-22 NOTE — Telephone Encounter (Signed)
Left message for pt to call back  °

## 2013-11-22 NOTE — Telephone Encounter (Signed)
Pt would like you to call her. Pt states she left a vm yesterday.  Pt had to put her husband in a facility and not handling very well.  Pt would like something for sleep. Pt home until 11:30 this am or LM.

## 2013-11-23 NOTE — Telephone Encounter (Signed)
Pt is having a hard time dealing with the separation of her husband, whom she had to put in a nursing home.  She would like something to help her sleep.  Please advise

## 2013-11-27 NOTE — Telephone Encounter (Signed)
Try OTC melatonin 2mg  po qhs

## 2013-11-29 NOTE — Telephone Encounter (Signed)
Pt aware, she wanted Dr Leanne Chang to know that she is going to get counseling next week

## 2013-11-29 NOTE — Telephone Encounter (Signed)
Left message for pt to call back  °

## 2013-11-30 ENCOUNTER — Ambulatory Visit: Payer: Medicare Other | Admitting: Family Medicine

## 2013-11-30 ENCOUNTER — Encounter: Payer: Self-pay | Admitting: Physician Assistant

## 2013-11-30 ENCOUNTER — Ambulatory Visit (INDEPENDENT_AMBULATORY_CARE_PROVIDER_SITE_OTHER): Payer: Medicare Other | Admitting: Physician Assistant

## 2013-11-30 VITALS — BP 132/78 | HR 72 | Temp 98.6°F | Resp 18

## 2013-11-30 DIAGNOSIS — L259 Unspecified contact dermatitis, unspecified cause: Secondary | ICD-10-CM

## 2013-11-30 DIAGNOSIS — H6120 Impacted cerumen, unspecified ear: Secondary | ICD-10-CM

## 2013-11-30 DIAGNOSIS — H6121 Impacted cerumen, right ear: Secondary | ICD-10-CM

## 2013-11-30 MED ORDER — PREDNISONE 10 MG PO TABS: ORAL_TABLET | ORAL | Status: DC

## 2013-11-30 NOTE — Progress Notes (Signed)
Pre visit review using our clinic review tool, if applicable. No additional management support is needed unless otherwise documented below in the visit note. 

## 2013-11-30 NOTE — Patient Instructions (Signed)
Prednisone taper as directed for poison sumac exposure.  Continue to use the over-the-counter anti-itch creams, and also use cool compresses to help with itching symptoms.  Over-the-counter earwax softening drops are available to clean your ears and prevent impaction.   Do not use Q-tips, or anything smaller than your elbow in your ear.  If emergency symptoms discussed during visit developed, seek medical attention immediately.  Followup as needed, or for worsening or persistent symptoms despite treatment.   Cerumen Impaction A cerumen impaction is when the wax in your ear forms a plug. This plug usually causes reduced hearing. Sometimes it also causes an earache or dizziness. Removing a cerumen impaction can be difficult and painful. The wax sticks to the ear canal. The canal is sensitive and bleeds easily. If you try to remove a heavy wax buildup with a cotton tipped swab, you may push it in further. Irrigation with water, suction, and small ear curettes may be used to clear out the wax. If the impaction is fixed to the skin in the ear canal, ear drops may be needed for a few days to loosen the wax. People who build up a lot of wax frequently can use ear wax removal products available in your local drugstore. SEEK MEDICAL CARE IF:  You develop an earache, increased hearing loss, or marked dizziness. Document Released: 06/19/2004 Document Revised: 08/04/2011 Document Reviewed: 08/09/2009 [facility] Patient Information 2015 [place], [place]. This information is not intended to replace advice given to you by your health care provider. Make sure you discuss any questions you have with your health care provider. Contact Dermatitis Contact dermatitis is a rash that happens when something touches the skin. You touched something that irritates your skin, or you have allergies to something you touched. HOME CARE   Avoid the thing that caused your rash.  Keep your rash away from hot water, soap,  sunlight, chemicals, and other things that might bother it.  Do not scratch your rash.  You can take cool baths to help stop itching.  Only take medicine as told by your doctor.  Keep all doctor visits as told. GET HELP RIGHT AWAY IF:   Your rash is not better after 3 days.  Your rash gets worse.  Your rash is puffy (swollen), tender, red, sore, or warm.  You have problems with your medicine. MAKE SURE YOU:   Understand these instructions.  Will watch your condition.  Will get help right away if you are not doing well or get worse. Document Released: 03/09/2009 Document Revised: 08/04/2011 Document Reviewed: 10/15/2010 [facility] Patient Information 2015 [place], [place]. This information is not intended to replace advice given to you by your health care provider. Make sure you discuss any questions you have with your health care provider.

## 2013-11-30 NOTE — Progress Notes (Signed)
Subjective:    Patient ID: Amy [name], female    DOB: April 26, 1930, 80 y.o.   MRN: [id number]  HPI Patient is a 80 y.o. female presenting for cerumen impaction and poison sumac. Pt has had decreased hearing that she has noticed only since this morning. She says it feels like her ear is full. No pain. She states that this has never happened to her before.  Pt also has had exposure to poison sumac last Thursday. She has noticed a rash develop on her left leg and believes it is spreading. She states that it has noticed red spots that are very itchy. She has been putting hydrocortisone cream on the area, and an anti itch lotion that seems to be helping. She is wondering if there is anything else that can be done as she is worried that it is spreading.   She denies F/C/N/V/D/SOB/CP/HA.   Review of Systems As per HPI and are otherwise negative.   Past Medical History  Diagnosis Date  . NEOPLASM, SKIN, UNCERTAIN BEHAVIOR 1/69/6789  . HYPOTHYROIDISM 04/13/2007  . DIABETES MELLITUS, TYPE II 05/22/2009  . HYPERTENSION 04/13/2007  . ABSCESS 12/18/2009  . HYPERGLYCEMIA 05/22/2009  . Diverticulosis   . Hemorrhoids   . Colon polyps     History   Social History  . Marital Status: Married    Spouse Name: N/A    Number of Children: 3  . Years of Education: N/A   Occupational History  . Retired    Social History Main Topics  . Smoking status: Never Smoker   . Smokeless tobacco: Never Used  . Alcohol Use: No  . Drug Use: No  . Sexual Activity: Not on file   Other Topics Concern  . Not on file   Social History Narrative   Daily caffeine     Past Surgical History  Procedure Laterality Date  . Appendectomy    . Cholecystectomy    . Abdominal hysterectomy    . Thyroidectomy      secondary to nodule, no cancer  . Tonsillectomy and adenoidectomy    . Oophorectomy      Family History  Problem Relation Age of Onset  . Hypertension Mother   . Stroke Father   . Colon  cancer Neg Hx     Allergies  Allergen Reactions  . Diclofenac Sodium     REACTION: nausea  . Piroxicam     REACTION: "SEVERE ALLERGY REACTION"    Current Outpatient Prescriptions on File Prior to Visit  Medication Sig Dispense Refill  . amLODipine (NORVASC) 5 MG tablet TAKE 1 TABLET BY MOUTH EVERY DAY NEED OFFICE VISIT  90 tablet  1  . aspirin 81 MG tablet Take 81 mg by mouth daily.        Marland Kitchen atorvastatin (LIPITOR) 20 MG tablet Take 1 tablet (20 mg total) by mouth daily.  90 tablet  3  . fluticasone (FLONASE) 50 MCG/ACT nasal spray Place 2 sprays into both nostrils daily.  16 g  11  . gabapentin (NEURONTIN) 100 MG capsule Take 1 capsule (100 mg total) by mouth 3 (three) times daily.  90 capsule  3  . levothyroxine (SYNTHROID, LEVOTHROID) 150 MCG tablet TAKE 1 TABLET BY MOUTH EVERY DAY  90 tablet  3  . metFORMIN (GLUCOPHAGE) 500 MG tablet TAKE 1 TABLET BY MOUTH TWICE A DAY WITH MEALS  60 tablet  5  . traMADol (ULTRAM) 50 MG tablet Take 1 tablet (50 mg total) by mouth every 6 (  six) hours as needed.  20 tablet  3   No current facility-administered medications on file prior to visit.    EXAM: BP 132/78  Pulse 72  Temp(Src) 98.6 F (37 C) (Oral)  Resp 18     Objective:   Physical Exam  Nursing note and vitals reviewed. Constitutional: She is oriented to person, place, and time. She appears well-developed and well-nourished. No distress.  HENT:  Head: Normocephalic and atraumatic.  Left Ear: External ear normal.  Right ear cerumen impaction. Lavage in office. Bilat. TMs normal.  Eyes: Conjunctivae and EOM are normal. Pupils are equal, round, and reactive to light.  Neck: Normal range of motion.  Cardiovascular: Normal rate, regular rhythm and intact distal pulses.   Pulmonary/Chest: Effort normal and breath sounds normal. No respiratory distress. She exhibits no tenderness.  Musculoskeletal: Normal range of motion.  Neurological: She is alert and oriented to person, place, and  time.  Skin: Skin is warm and dry. Rash noted. She is not diaphoretic. No pallor.  erythematous rash spread over anterior thigh. Non-TTP. No surrounding erythema. No fluctuance.  Psychiatric: She has a normal mood and affect. Her behavior is normal. Judgment and thought content normal.     Lab Results  Component Value Date   WBC 8.5 01/06/2011   HGB 15.0 01/16/2013   HCT 44.0 01/16/2013   PLT 183.0 01/06/2011   GLUCOSE 125* 04/25/2013   CHOL 216* 04/25/2013   TRIG 171.0* 04/25/2013   HDL 49.70 04/25/2013   LDLDIRECT 142.6 04/25/2013   ALT 16 04/25/2013   AST 18 04/25/2013   NA 139 04/25/2013   K 3.7 04/25/2013   CL 103 04/25/2013   CREATININE 0.9 04/25/2013   BUN 14 04/25/2013   CO2 28 04/25/2013   TSH 4.75 04/25/2013   HGBA1C 6.8* 04/25/2013   MICROALBUR 2.6* 04/25/2013        Assessment & Plan:  Amy [name] was seen today for cerumen impaction and poison sumac.  Diagnoses and associated orders for this visit:  Cerumen impaction, right Comments: lavage in office. advise against qtip use. Recomend OTC ear wax drops for cleaning ears.  Contact dermatitis Comments: Poison Sumac exposure. Continue OTC creams to help itching. Add cool compresses. Wil Rx prednisone taper. - predniSONE (DELTASONE) 10 MG tablet; 3 tablets twice daily for 2 days, then 2 tablets twice daily for 2 days,  then 1 tablet twice daily for 2 days, then one tablet daily  for 6 days    Pt will use OTC ear wax drops. Educated on nothing larger than elbow in ear.  Return precautions provided, and patient handout on cerumen impaction, contact dermatitis.  Plan to follow up as needed, or for worsening or persistent symptoms despite treatment.  Patient Instructions  Prednisone taper as directed for poison sumac exposure.  Continue to use the over-the-counter anti-itch creams, and also use cool compresses to help with itching symptoms.  Over-the-counter earwax softening drops are available to clean your ears and prevent  impaction.   Do not use Q-tips, or anything smaller than your elbow in your ear.  If emergency symptoms discussed during visit developed, seek medical attention immediately.  Followup as needed, or for worsening or persistent symptoms despite treatment.

## 2014-03-10 ENCOUNTER — Telehealth: Payer: Self-pay | Admitting: Internal Medicine

## 2014-03-10 MED ORDER — METFORMIN HCL 500 MG PO TABS: ORAL_TABLET | ORAL | Status: DC

## 2014-03-10 NOTE — Telephone Encounter (Signed)
[id number] - [place], [place] - [address] is requesting re-fill on metFORMIN (GLUCOPHAGE) 500 MG tablet

## 2014-03-10 NOTE — Telephone Encounter (Signed)
rx sent in electronically 

## 2014-03-14 ENCOUNTER — Telehealth: Payer: Self-pay | Admitting: Internal Medicine

## 2014-03-14 MED ORDER — AMLODIPINE BESYLATE 5 MG PO TABS: ORAL_TABLET | ORAL | Status: DC

## 2014-03-14 NOTE — Telephone Encounter (Signed)
90 day supply sent in electronically 

## 2014-03-14 NOTE — Telephone Encounter (Signed)
[id number] - [place], [place] - [address] is requesting 90 day re-fill on amLODipine (NORVASC) 5 MG tablet

## 2014-04-04 ENCOUNTER — Ambulatory Visit: Payer: Medicare Other

## 2014-04-05 ENCOUNTER — Telehealth: Payer: Self-pay | Admitting: *Deleted

## 2014-04-05 NOTE — Telephone Encounter (Signed)
Pt has seen Dr Elease Hashimoto in the past and was wondering if he could accept her as a new patient.  Please advise

## 2014-04-05 NOTE — Telephone Encounter (Signed)
i'm sorry but I cannot at this time.

## 2014-04-05 NOTE — Telephone Encounter (Signed)
Can you see if pt wants to establish with Maudie Mercury or Yong Channel?

## 2014-04-06 NOTE — Telephone Encounter (Signed)
I contacted pt and advised her of Dr Elease Hashimoto decision. Ask her if the wanted to establish with someone else she said she will think about it and call back.Amy [name]

## 2014-04-27 ENCOUNTER — Telehealth: Payer: Self-pay

## 2014-04-27 NOTE — Telephone Encounter (Signed)
Received a vm from pt stating she has congestion in her head and fatigue.  Pt states she went to urgent med and was turned away due to having [facility].  Pt states she has a hair appt on 12.4.15 at 1 pm.  Pt will call back on tomorrow and possibly be scheduled for Sat clinic.

## 2014-04-28 ENCOUNTER — Ambulatory Visit (INDEPENDENT_AMBULATORY_CARE_PROVIDER_SITE_OTHER): Payer: Medicare Other | Admitting: Family Medicine

## 2014-04-28 ENCOUNTER — Ambulatory Visit: Payer: Medicare Other | Admitting: Family Medicine

## 2014-04-28 ENCOUNTER — Encounter: Payer: Self-pay | Admitting: Family Medicine

## 2014-04-28 VITALS — BP 130/80 | Temp 98.8°F

## 2014-04-28 DIAGNOSIS — J0100 Acute maxillary sinusitis, unspecified: Secondary | ICD-10-CM

## 2014-04-28 DIAGNOSIS — E119 Type 2 diabetes mellitus without complications: Secondary | ICD-10-CM

## 2014-04-28 LAB — POCT URINALYSIS DIPSTICK
Bilirubin, UA: NEGATIVE
Blood, UA: NEGATIVE
Glucose, UA: NEGATIVE
Ketones, UA: NEGATIVE
NITRITE UA: NEGATIVE
Spec Grav, UA: 1.015
UROBILINOGEN UA: 0.2
pH, UA: 5.5

## 2014-04-28 MED ORDER — AZITHROMYCIN 250 MG PO TABS: ORAL_TABLET | ORAL | Status: DC

## 2014-04-28 NOTE — Progress Notes (Signed)
   Subjective:    Patient ID: Amy [name], female    DOB: 1930/05/06, 80 y.o.   MRN: [id number]  Sinusitis Associated symptoms include congestion, coughing and sinus pressure. Pertinent negatives include no chills or diaphoresis.   Here for one week of sinus pressure, PND, and a dry cough. No fever. She also describes feeling extremely tired for the past few weeks, even before she got sick. Her husband passed away 4 weeks ago after a prolonged illness, and she assumes she is just worn out from the stress of all this. She has not had labs done for a year now.    Review of Systems  Constitutional: Positive for fatigue. Negative for fever, chills and diaphoresis.  HENT: Positive for congestion, postnasal drip and sinus pressure.   Eyes: Negative.   Respiratory: Positive for cough.        Objective:   Physical Exam  Constitutional: She is oriented to person, place, and time. She appears well-developed and well-nourished.  HENT:  Right Ear: External ear normal.  Left Ear: External ear normal.  Nose: Nose normal.  Mouth/Throat: Oropharynx is clear and moist.  Eyes: Conjunctivae are normal.  Pulmonary/Chest: Effort normal and breath sounds normal.  Lymphadenopathy:    She has no cervical adenopathy.  Neurological: She is alert and oriented to person, place, and time.          Assessment & Plan:  Treat the sinusitis with a Zpack. I agree she is probably tired from the stress of her husband passing, but we will check a few labs today to be sure

## 2014-04-28 NOTE — Progress Notes (Signed)
Pre visit review using our clinic review tool, if applicable. No additional management support is needed unless otherwise documented below in the visit note. 

## 2014-04-29 LAB — CBC WITH DIFFERENTIAL/PLATELET
BASOS ABS: 0 10*3/uL (ref 0.0–0.1)
Basophils Relative: 0.4 % (ref 0.0–3.0)
Eosinophils Absolute: 0.1 10*3/uL (ref 0.0–0.7)
Eosinophils Relative: 1.7 % (ref 0.0–5.0)
HEMATOCRIT: 39.2 % (ref 36.0–46.0)
Hemoglobin: 12.7 g/dL (ref 12.0–15.0)
LYMPHS PCT: 29.5 % (ref 12.0–46.0)
Lymphs Abs: 2.1 10*3/uL (ref 0.7–4.0)
MCHC: 32.5 g/dL (ref 30.0–36.0)
MCV: 92.9 fl (ref 78.0–100.0)
MONOS PCT: 5.6 % (ref 3.0–12.0)
Monocytes Absolute: 0.4 10*3/uL (ref 0.1–1.0)
NEUTROS PCT: 62.8 % (ref 43.0–77.0)
Neutro Abs: 4.4 10*3/uL (ref 1.4–7.7)
PLATELETS: 214 10*3/uL (ref 150.0–400.0)
RBC: 4.22 Mil/uL (ref 3.87–5.11)
RDW: 12.9 % (ref 11.5–15.5)
WBC: 7.1 10*3/uL (ref 4.0–10.5)

## 2014-04-29 LAB — TSH: TSH: 1.52 u[IU]/mL (ref 0.35–4.50)

## 2014-04-29 LAB — HEMOGLOBIN A1C: HEMOGLOBIN A1C: 8.8 % — AB (ref 4.6–6.5)

## 2014-04-30 LAB — BASIC METABOLIC PANEL
BUN: 13 mg/dL (ref 6–23)
CALCIUM: 9.6 mg/dL (ref 8.4–10.5)
CO2: 24 meq/L (ref 19–32)
CREATININE: 0.8 mg/dL (ref 0.4–1.2)
Chloride: 105 mEq/L (ref 96–112)
GFR: 71.9 mL/min (ref >60.00)
GLUCOSE: 136 mg/dL — AB (ref 70–99)
Potassium: 4 mEq/L (ref 3.5–5.1)
Sodium: 140 mEq/L (ref 135–145)

## 2014-04-30 LAB — HEPATIC FUNCTION PANEL
ALBUMIN: 4 g/dL (ref 3.5–5.2)
ALT: 21 U/L (ref 0–35)
AST: 19 U/L (ref 0–37)
Alkaline Phosphatase: 77 U/L (ref 39–117)
Bilirubin, Direct: 0.1 mg/dL (ref 0.0–0.3)
TOTAL PROTEIN: 7.3 g/dL (ref 6.0–8.3)
Total Bilirubin: 0.8 mg/dL (ref 0.2–1.2)

## 2014-05-04 MED ORDER — CEFUROXIME AXETIL 500 MG PO TABS: 500.0000 mg | ORAL_TABLET | Freq: Two times a day (BID) | ORAL | Status: DC

## 2014-05-04 NOTE — Addendum Note (Signed)
Addended by: Aggie Hacker A on: 05/04/2014 12:13 PM   Modules accepted: Orders

## 2014-05-04 NOTE — Addendum Note (Signed)
Addended by: Aggie Hacker A on: 05/04/2014 12:12 PM   Modules accepted: Orders

## 2014-07-21 ENCOUNTER — Other Ambulatory Visit: Payer: Self-pay | Admitting: Internal Medicine

## 2014-07-24 ENCOUNTER — Encounter: Payer: Self-pay | Admitting: Family Medicine

## 2014-07-24 ENCOUNTER — Ambulatory Visit (INDEPENDENT_AMBULATORY_CARE_PROVIDER_SITE_OTHER): Payer: Medicare Other | Admitting: Family Medicine

## 2014-07-24 VITALS — BP 134/76 | HR 82 | Temp 98.1°F

## 2014-07-24 DIAGNOSIS — R1012 Left upper quadrant pain: Secondary | ICD-10-CM | POA: Diagnosis not present

## 2014-07-24 LAB — CBC WITH DIFFERENTIAL/PLATELET
BASOS PCT: 0.3 % (ref 0.0–3.0)
Basophils Absolute: 0 10*3/uL (ref 0.0–0.1)
EOS ABS: 0.2 10*3/uL (ref 0.0–0.7)
Eosinophils Relative: 2.3 % (ref 0.0–5.0)
HEMATOCRIT: 38.3 % (ref 36.0–46.0)
HEMOGLOBIN: 12.8 g/dL (ref 12.0–15.0)
LYMPHS ABS: 2.4 10*3/uL (ref 0.7–4.0)
Lymphocytes Relative: 28 % (ref 12.0–46.0)
MCHC: 33.4 g/dL (ref 30.0–36.0)
MCV: 89.9 fl (ref 78.0–100.0)
MONO ABS: 0.7 10*3/uL (ref 0.1–1.0)
MONOS PCT: 7.6 % (ref 3.0–12.0)
Neutro Abs: 5.4 10*3/uL (ref 1.4–7.7)
Neutrophils Relative %: 61.8 % (ref 43.0–77.0)
PLATELETS: 238 10*3/uL (ref 150.0–400.0)
RBC: 4.26 Mil/uL (ref 3.87–5.11)
RDW: 13.7 % (ref 11.5–15.5)
WBC: 8.7 10*3/uL (ref 4.0–10.5)

## 2014-07-24 LAB — BASIC METABOLIC PANEL
BUN: 14 mg/dL (ref 6–23)
CALCIUM: 9.7 mg/dL (ref 8.4–10.5)
CO2: 28 mEq/L (ref 19–32)
Chloride: 103 mEq/L (ref 96–112)
Creatinine, Ser: 0.76 mg/dL (ref 0.40–1.20)
GFR: 77.34 mL/min (ref >60.00)
Glucose, Bld: 120 mg/dL — ABNORMAL HIGH (ref 70–99)
Potassium: 3.9 mEq/L (ref 3.5–5.1)
Sodium: 137 mEq/L (ref 135–145)

## 2014-07-24 LAB — HEPATIC FUNCTION PANEL
ALT: 19 U/L (ref 0–35)
AST: 19 U/L (ref 0–37)
Albumin: 4.2 g/dL (ref 3.5–5.2)
Alkaline Phosphatase: 80 U/L (ref 39–117)
BILIRUBIN DIRECT: 0.1 mg/dL (ref 0.0–0.3)
Total Bilirubin: 0.8 mg/dL (ref 0.2–1.2)
Total Protein: 7.5 g/dL (ref 6.0–8.3)

## 2014-07-24 LAB — LIPASE: LIPASE: 32 U/L (ref 11.0–59.0)

## 2014-07-24 LAB — AMYLASE: Amylase: 23 U/L — ABNORMAL LOW (ref 27–131)

## 2014-07-24 NOTE — Progress Notes (Signed)
Pre visit review using our clinic review tool, if applicable. No additional management support is needed unless otherwise documented below in the visit note. 

## 2014-07-24 NOTE — Progress Notes (Signed)
   Subjective:    Patient ID: Amy [name], female    DOB: [birth year], 81 y.o.   MRN: [id number]  HPI Here for 2 weeks of LUQ pains that come and go. No nausea or fever. Her appetite is normal. She has had more heatburn that usual and TUMS usually helps this. No change in bowel or urinary habits. She has been under a lot of stress since her husband died recently. She is involved in her church and in a grief group that meets regularly.    Review of Systems  Constitutional: Negative.   Respiratory: Negative.   Cardiovascular: Negative.   Gastrointestinal: Positive for abdominal pain. Negative for nausea, vomiting, diarrhea, constipation, blood in stool, abdominal distention, anal bleeding and rectal pain.  Genitourinary: Negative.        Objective:   Physical Exam  Constitutional: She appears well-developed and well-nourished. No distress.  Cardiovascular: Normal rate, regular rhythm, normal heart sounds and intact distal pulses.   Pulmonary/Chest: Effort normal and breath sounds normal.  Abdominal: Soft. Bowel sounds are normal. She exhibits no distension and no mass. There is no rebound and no guarding.  Mild LUQ tenderness          Assessment & Plan:  This is most consistent with gastritis so she will start taking Prilosec 20 mg every morning. Get labs today

## 2014-07-28 ENCOUNTER — Other Ambulatory Visit: Payer: Self-pay | Admitting: *Deleted

## 2014-07-28 MED ORDER — OMEPRAZOLE 40 MG PO CPDR: 40.0000 mg | DELAYED_RELEASE_CAPSULE | Freq: Every day | ORAL | Status: DC

## 2014-07-31 ENCOUNTER — Ambulatory Visit (INDEPENDENT_AMBULATORY_CARE_PROVIDER_SITE_OTHER): Payer: Medicare Other | Admitting: Family Medicine

## 2014-07-31 ENCOUNTER — Encounter: Payer: Self-pay | Admitting: Family Medicine

## 2014-07-31 VITALS — BP 155/94 | HR 74 | Temp 97.7°F

## 2014-07-31 DIAGNOSIS — R1012 Left upper quadrant pain: Secondary | ICD-10-CM

## 2014-07-31 NOTE — Progress Notes (Signed)
   Subjective:    Patient ID: Amy [name], female    DOB: [birth year], 81 y.o.   MRN: [id number]  HPI Here to follow up intermittent LUQ pains which started about several weeks ago. We have drawn labs on her including a liver panel and pancreatic enzymes that were normal. She is taking Omeprazole 40 mg each morning and Zantac 150 mg each evening, but these have not helped. She still denies any fever or nausea, and her urinary and bowel habits are normal.    Review of Systems  Constitutional: Negative.   Respiratory: Negative.   Cardiovascular: Negative.   Gastrointestinal: Positive for abdominal pain. Negative for nausea, vomiting, diarrhea, constipation, blood in stool, abdominal distention, anal bleeding and rectal pain.  Genitourinary: Negative.        Objective:   Physical Exam  Constitutional: She appears well-developed and well-nourished. No distress.  Pulmonary/Chest: Effort normal and breath sounds normal.  Abdominal: Soft. Bowel sounds are normal. She exhibits no distension and no mass. There is no rebound and no guarding.  Mildly tender in the LUQ           Assessment & Plan:  LUQ of uncertain etiology. Set up a CT scan soon

## 2014-08-03 ENCOUNTER — Ambulatory Visit (INDEPENDENT_AMBULATORY_CARE_PROVIDER_SITE_OTHER)
Admission: RE | Admit: 2014-08-03 | Discharge: 2014-08-03 | Disposition: A | Discharge status: Home / Self Care | Payer: Medicare Other | Source: Ambulatory Visit | Attending: Family Medicine | Admitting: Family Medicine

## 2014-08-03 DIAGNOSIS — R1012 Left upper quadrant pain: Secondary | ICD-10-CM

## 2014-08-03 MED ORDER — IOHEXOL 300 MG/ML  SOLN: 100.0000 mL | Freq: Once | INTRAMUSCULAR | Status: AC | PRN

## 2014-08-07 ENCOUNTER — Encounter: Payer: Self-pay | Admitting: Family Medicine

## 2014-08-07 ENCOUNTER — Ambulatory Visit (INDEPENDENT_AMBULATORY_CARE_PROVIDER_SITE_OTHER): Payer: Medicare Other | Admitting: Family Medicine

## 2014-08-07 ENCOUNTER — Other Ambulatory Visit: Payer: Self-pay | Admitting: Internal Medicine

## 2014-08-07 VITALS — BP 162/93 | HR 78 | Temp 99.1°F

## 2014-08-07 DIAGNOSIS — R1012 Left upper quadrant pain: Secondary | ICD-10-CM

## 2014-08-07 MED ORDER — HYDROMORPHONE HCL 4 MG PO TABS: 4.0000 mg | ORAL_TABLET | Freq: Four times a day (QID) | ORAL | Status: DC | PRN

## 2014-08-07 NOTE — Progress Notes (Signed)
   Subjective:    Patient ID: EMMALI KAROW, female    DOB: 01-20-1930, 81 y.o.   MRN: [id number]  HPI Here for intermittent LUQ pains that started 4 weeks ago. Her symptoms really have not changed much in that time. She gets sharp intermittent pains and may have some nausea at times. No fever. She has had labs drawn that were normal and had a CT scan of the abdomen and pelvis that was normal. She has been taking Prilosec and Zantac but she has not improved at all.    Review of Systems  Constitutional: Negative.   Respiratory: Negative.   Cardiovascular: Negative.   Gastrointestinal: Positive for abdominal pain. Negative for nausea, vomiting, diarrhea, constipation, blood in stool, abdominal distention and anal bleeding.  Genitourinary: Negative.        Objective:   Physical Exam  Constitutional: She appears well-developed and well-nourished.  Cardiovascular: Normal rate, regular rhythm, normal heart sounds and intact distal pulses.   Pulmonary/Chest: Effort normal and breath sounds normal.  Abdominal: Soft. Bowel sounds are normal. She exhibits no distension and no mass. There is no tenderness. There is no rebound and no guarding.          Assessment & Plan:  LUQ pain of uncertain etiology. We will refer to GI to evaluate

## 2014-08-07 NOTE — Progress Notes (Signed)
Pre visit review using our clinic review tool, if applicable. No additional management support is needed unless otherwise documented below in the visit note. 

## 2014-08-09 ENCOUNTER — Telehealth: Payer: Self-pay

## 2014-08-09 NOTE — Telephone Encounter (Signed)
Called and spoke with [facility] and pt was scheduled for August 15, 2014 at 10 am.  Called and left a detailed message for pt on home voicemail.

## 2014-08-15 ENCOUNTER — Encounter: Payer: Self-pay | Admitting: Nurse Practitioner

## 2014-08-15 ENCOUNTER — Ambulatory Visit (INDEPENDENT_AMBULATORY_CARE_PROVIDER_SITE_OTHER): Payer: Medicare Other | Admitting: Nurse Practitioner

## 2014-08-15 VITALS — BP 130/70 | HR 70 | Ht 67.0 in | Wt 207.6 lb

## 2014-08-15 DIAGNOSIS — R1012 Left upper quadrant pain: Secondary | ICD-10-CM

## 2014-08-15 MED ORDER — METHOCARBAMOL 750 MG PO TABS: 750.0000 mg | ORAL_TABLET | Freq: Every day | ORAL | Status: DC

## 2014-08-15 NOTE — Patient Instructions (Addendum)
We have sent the following medications to your pharmacy for you to pick up at your convenience: Robaxin  Take Ibuprofen 400mg  three times a day for 7 days.  (patient has at home)   Continue your Prilosec.   Hold off on core/back exercises.  You have been scheduled for an endoscopy. Please follow written instructions given to you at your visit today. If you use inhalers (even only as needed), please bring them with you on the day of your procedure. Hold your diabetic medicine the day of your procedure. (metformin)  I appreciate the opportunity to care for you.

## 2014-08-15 NOTE — Progress Notes (Signed)
     History of Present Illness:   Patient is an 81 year old female known to Dr. Deatra Ina. She has a history of adenomatous colon polyps, diverticulosis and hemorrhoids. Patient was last seen January 2013 at which time she reported bowel changes. Colonoscopy was scheduled but not done for unclear reasons.   Patient is referred by her PCP for intermittent left upper quadrant pain present for about 6 weeks now. Patient's husband died in May 17, 2023, she began exercising to help with grief. When the LUQ pain began patient thought it was probably musculoskeletal. As the pain persisted patient felt like there was something else going on. She was evaluated by PCP and has been taking Prilosec over the last few weeks without improvement. The pain is not necessarily related to eating, it does disrupt her sleep at night. Labs 07/24/14 including comprehensive metabolic profile, amylase, lipase, and CBC were all normal. CT scan with contrast for evaluation of pain on 08/03/14 was negative for any acute findings.   Current Medications, Allergies, Past Medical History, Past Surgical History, Family History and Social History were reviewed in [employer] record.  Studies:   Ct Abdomen Pelvis W Contrast  08/03/2014   CLINICAL DATA:  Left upper quadrant pain, radiating to back.  EXAM: CT ABDOMEN AND PELVIS WITH CONTRAST  TECHNIQUE: Multidetector CT imaging of the abdomen and pelvis was performed using the standard protocol following bolus administration of intravenous contrast.  CONTRAST:  100 cc Omnipaque 300 IV.  COMPARISON:  02/05/2011  FINDINGS: Linear scarring noted at the left lung base. Heart is normal size. No effusions.  Prior cholecystectomy. Liver, spleen, pancreas, adrenals and kidneys are unremarkable. Stomach, large and small bowel are unremarkable. No free fluid, free air or adenopathy. Prior hysterectomy. No adnexal masses. Urinary bladder is unremarkable.  No acute bony abnormality or  focal bone lesion. Aortic calcifications without aneurysm.  IMPRESSION: No acute findings in the abdomen or pelvis.   Electronically Signed   By: Rolm Baptise M.D.   On: 08/03/2014 14:26     Physical Exam: General: Pleasant, well developed , white female in no acute distress Head: Normocephalic and atraumatic Eyes:  sclerae anicteric, conjunctiva pink  Ears: Normal auditory acuity Lungs: Clear throughout to auscultation Heart: Regular rate and rhythm Abdomen: Soft, non distended, non-tender. No masses, no hepatomegaly. Normal bowel sounds. Negative [place] Musculoskeletal: Symmetrical with no gross deformities  Extremities: No edema  Neurological: Alert oriented x 4, grossly nonfocal Psychological:  Alert and cooperative. Normal mood and affect  Assessment and Recommendations:    81 year old female with 6 week history of LUQ pain. Labs and abd CT scan negative. Pain began after initiation of exercise program though patient doesn't feel pain is musculoskeletal.   Trial of TID Ibuprofen for one week.   Continue PPI.   Patient will be scheduled for EGD for further evaluation. If pain improves with NSAIDS we can hold off on EGD  I asked her to hold off on exercise, at least upper body, until pain can be sorted out.

## 2014-08-19 ENCOUNTER — Encounter: Payer: Self-pay | Admitting: Nurse Practitioner

## 2014-08-19 DIAGNOSIS — R1012 Left upper quadrant pain: Secondary | ICD-10-CM | POA: Insufficient documentation

## 2014-08-21 ENCOUNTER — Telehealth: Payer: Self-pay | Admitting: Nurse Practitioner

## 2014-08-21 NOTE — Progress Notes (Signed)
Reviewed and agree with management.  Is pain affected by bowel movements?  If so and if she's not improved with ibuprofen may consider Hemoccults and possible colonoscopy pending results of upper endoscopy Sandy Salaam. Deatra Ina, M.D., [facility]

## 2014-08-21 NOTE — Telephone Encounter (Signed)
Unable to reach patient. Will try again later. 

## 2014-08-22 NOTE — Telephone Encounter (Signed)
FYI:  Pt wants to let Nevin Bloodgood know that the Robaxin has not helped and will keep appt for EGD on 09/14/14.

## 2014-08-23 NOTE — Telephone Encounter (Signed)
Ok. Noted 

## 2014-08-28 ENCOUNTER — Encounter: Payer: Self-pay | Admitting: Adult Health

## 2014-08-28 ENCOUNTER — Ambulatory Visit (INDEPENDENT_AMBULATORY_CARE_PROVIDER_SITE_OTHER): Payer: Medicare Other | Admitting: Adult Health

## 2014-08-28 ENCOUNTER — Ambulatory Visit: Payer: Medicare Other | Admitting: Internal Medicine

## 2014-08-28 VITALS — BP 130/80 | Temp 98.7°F

## 2014-08-28 DIAGNOSIS — H9311 Tinnitus, right ear: Secondary | ICD-10-CM

## 2014-08-28 DIAGNOSIS — H6122 Impacted cerumen, left ear: Secondary | ICD-10-CM | POA: Diagnosis not present

## 2014-08-28 NOTE — Progress Notes (Signed)
   Subjective:    Patient ID: Amy [name], female    DOB: 28-Mar-1930, 81 y.o.   MRN: [id number]  HPI  [name] presents to the office today for Cerumen Impaction. She first noticed that she was having trouble hearing and noticed muffled voices on Friday or Saturday. She only endorses that her hearing is infected in the right ear. She denies any pain or fever. Has never had this problem before.   Patient also endorses constant Tinnitus that has been going on for "years" as well as she feels though her hearing is getting worse.    Review of Systems  HENT: Positive for hearing loss. Negative for ear discharge, ear pain and voice change.   All other systems reviewed and are negative.      Objective:   Physical Exam  Constitutional: She is oriented to person, place, and time. She appears well-developed and well-nourished. No distress.  HENT:  Head: Normocephalic and atraumatic.  Mouth/Throat: Oropharynx is clear and moist. No oropharyngeal exudate.  Cerumen impaction in right ear with mild cerumen in left ear. No redness or irritation noted.   Neurological: She is alert and oriented to person, place, and time.  Skin: She is not diaphoretic.  Nursing note and vitals reviewed.         Assessment & Plan:  Cerumen Impaction - Bilateral ear canals cleaned of cerumen.  - Advised to use OTC ear irrigation to help with this issues  - Advised not to use Q-tip as this can push the cerumen farther back into the ear canal  - Complains of mild soreness in right ear canal. Advised that it did not look red or swollen. She can call back in three days if still having soreness or having fevers.    Tinnitus/Hearing loss - Referral to audiologist placed.

## 2014-08-28 NOTE — Patient Instructions (Signed)
You should start to hear better after cleaning out your ears. You can buy over the counter ear drops at any pharmacy which will help with ear wax buildup. It was very nice meeting you.   Cerumen Impaction A cerumen impaction is when the wax in your ear forms a plug. This plug usually causes reduced hearing. Sometimes it also causes an earache or dizziness. Removing a cerumen impaction can be difficult and painful. The wax sticks to the ear canal. The canal is sensitive and bleeds easily. If you try to remove a heavy wax buildup with a cotton tipped swab, you may push it in further. Irrigation with water, suction, and small ear curettes may be used to clear out the wax. If the impaction is fixed to the skin in the ear canal, ear drops may be needed for a few days to loosen the wax. People who build up a lot of wax frequently can use ear wax removal products available in your local drugstore. SEEK MEDICAL CARE IF:  You develop an earache, increased hearing loss, or marked dizziness. Document Released: 06/19/2004 Document Revised: 08/04/2011 Document Reviewed: 08/09/2009 [facility] Patient Information 2015 [place], [place]. This information is not intended to replace advice given to you by your health care provider. Make sure you discuss any questions you have with your health care provider.

## 2014-08-30 ENCOUNTER — Telehealth: Payer: Self-pay | Admitting: Family Medicine

## 2014-08-30 ENCOUNTER — Encounter: Payer: Self-pay | Admitting: Gastroenterology

## 2014-08-30 NOTE — Telephone Encounter (Signed)
Pt said she is in a lot pain and is asking if Dr Sarajane Jews can contact the GI office and see if she can get in any earlier than 09/14/14

## 2014-08-31 NOTE — Telephone Encounter (Signed)
Pt is aware waiting on md to reply. Pt is also aware md works half day today

## 2014-08-31 NOTE — Telephone Encounter (Signed)
Per Dr. Sarajane Jews that is the best available appointment. We cannot get pt seen any sooner, however she can ask the office maybe if someone cancels they could give her a call. I left voice message with this information.

## 2014-09-06 ENCOUNTER — Ambulatory Visit (INDEPENDENT_AMBULATORY_CARE_PROVIDER_SITE_OTHER): Payer: Medicare Other | Admitting: Family Medicine

## 2014-09-06 ENCOUNTER — Encounter: Payer: Self-pay | Admitting: Family Medicine

## 2014-09-06 VITALS — BP 158/91 | HR 79 | Temp 98.8°F

## 2014-09-06 DIAGNOSIS — R1012 Left upper quadrant pain: Secondary | ICD-10-CM

## 2014-09-06 MED ORDER — HYDROMORPHONE HCL 4 MG PO TABS: 4.0000 mg | ORAL_TABLET | Freq: Four times a day (QID) | ORAL | Status: DC | PRN

## 2014-09-06 NOTE — Progress Notes (Signed)
Pre visit review using our clinic review tool, if applicable. No additional management support is needed unless otherwise documented below in the visit note. Pt declined to weigh 

## 2014-09-06 NOTE — Progress Notes (Signed)
   Subjective:    Patient ID: Amy [name], female    DOB: 18-May-1930, 81 y.o.   MRN: [id number]  HPI Here to recheck LUQ pain that started about 2 months ago. She has had an extensive workup so far including labs and a CT which have been normal. She saw the GI office and has an EGD scheduled with Dr. Deatra Ina for 09-14-14. She has been taking one Dilaudid a day for pain relief and this has helped a lot. She has continued to go to exercise classes to strengthen her core.    Review of Systems  Constitutional: Negative.   Gastrointestinal: Positive for abdominal pain. Negative for nausea, vomiting, diarrhea, constipation, blood in stool, abdominal distention, anal bleeding and rectal pain.  Genitourinary: Negative.        Objective:   Physical Exam  Constitutional: She appears well-developed and well-nourished.  Pulmonary/Chest: Effort normal and breath sounds normal.  Abdominal: Soft. Bowel sounds are normal. She exhibits no distension and no mass. There is no rebound and no guarding.  She is tender along the inferior left rib margin both along the side and the left anterior trunk          Assessment & Plan:  It is now apparent that this is a muscular pain after all. She has probably torn some of the attachments of her rectus muscles to the inferior rib cage, and these have not had a chance to heal because she continues to stress them with her exercises. We will contact the GI office to cancel the EGD. She will stop all the core exercises for a month or two to allow the muscles to heal. Recheck in one month

## 2014-09-14 ENCOUNTER — Encounter: Payer: Medicare Other | Admitting: Gastroenterology

## 2014-09-25 ENCOUNTER — Ambulatory Visit: Payer: Medicare Other | Admitting: Family Medicine

## 2014-11-11 ENCOUNTER — Other Ambulatory Visit: Payer: Self-pay | Admitting: Internal Medicine

## 2014-11-24 ENCOUNTER — Other Ambulatory Visit: Payer: Self-pay | Admitting: Internal Medicine

## 2014-11-29 ENCOUNTER — Other Ambulatory Visit: Payer: Self-pay | Admitting: Internal Medicine

## 2014-12-11 ENCOUNTER — Ambulatory Visit (INDEPENDENT_AMBULATORY_CARE_PROVIDER_SITE_OTHER): Payer: Medicare Other | Admitting: Family Medicine

## 2014-12-11 ENCOUNTER — Encounter: Payer: Self-pay | Admitting: Family Medicine

## 2014-12-11 VITALS — BP 138/87 | HR 81 | Temp 99.6°F

## 2014-12-11 DIAGNOSIS — R1012 Left upper quadrant pain: Secondary | ICD-10-CM

## 2014-12-11 DIAGNOSIS — E119 Type 2 diabetes mellitus without complications: Secondary | ICD-10-CM

## 2014-12-11 DIAGNOSIS — R109 Unspecified abdominal pain: Secondary | ICD-10-CM | POA: Diagnosis not present

## 2014-12-11 LAB — POCT URINALYSIS DIPSTICK
Bilirubin, UA: NEGATIVE
Blood, UA: NEGATIVE
Nitrite, UA: NEGATIVE
PH UA: 6
SPEC GRAV UA: 1.02
UROBILINOGEN UA: 0.2

## 2014-12-11 LAB — LIPASE: Lipase: 17 U/L (ref 11.0–59.0)

## 2014-12-11 LAB — HEPATIC FUNCTION PANEL
ALBUMIN: 4.1 g/dL (ref 3.5–5.2)
ALT: 20 U/L (ref 0–35)
AST: 18 U/L (ref 0–37)
Alkaline Phosphatase: 85 U/L (ref 39–117)
BILIRUBIN DIRECT: 0.1 mg/dL (ref 0.0–0.3)
BILIRUBIN TOTAL: 0.9 mg/dL (ref 0.2–1.2)
Total Protein: 7.2 g/dL (ref 6.0–8.3)

## 2014-12-11 LAB — BASIC METABOLIC PANEL
BUN: 14 mg/dL (ref 6–23)
CHLORIDE: 102 meq/L (ref 96–112)
CO2: 30 mEq/L (ref 19–32)
Calcium: 9.5 mg/dL (ref 8.4–10.5)
Creatinine, Ser: 0.77 mg/dL (ref 0.40–1.20)
GFR: 76.11 mL/min (ref >60.00)
GLUCOSE: 165 mg/dL — AB (ref 70–99)
POTASSIUM: 4.1 meq/L (ref 3.5–5.1)
Sodium: 138 mEq/L (ref 135–145)

## 2014-12-11 LAB — CBC WITH DIFFERENTIAL/PLATELET
Basophils Absolute: 0 10*3/uL (ref 0.0–0.1)
Basophils Relative: 0.3 % (ref 0.0–3.0)
Eosinophils Absolute: 0.1 10*3/uL (ref 0.0–0.7)
Eosinophils Relative: 2 % (ref 0.0–5.0)
HEMATOCRIT: 39 % (ref 36.0–46.0)
Hemoglobin: 13 g/dL (ref 12.0–15.0)
LYMPHS ABS: 2.1 10*3/uL (ref 0.7–4.0)
Lymphocytes Relative: 29 % (ref 12.0–46.0)
MCHC: 33.3 g/dL (ref 30.0–36.0)
MCV: 90.4 fl (ref 78.0–100.0)
MONO ABS: 0.6 10*3/uL (ref 0.1–1.0)
Monocytes Relative: 8 % (ref 3.0–12.0)
Neutro Abs: 4.5 10*3/uL (ref 1.4–7.7)
Neutrophils Relative %: 60.7 % (ref 43.0–77.0)
Platelets: 209 10*3/uL (ref 150.0–400.0)
RBC: 4.31 Mil/uL (ref 3.87–5.11)
RDW: 14.4 % (ref 11.5–15.5)
WBC: 7.4 10*3/uL (ref 4.0–10.5)

## 2014-12-11 LAB — AMYLASE: Amylase: 20 U/L — ABNORMAL LOW (ref 27–131)

## 2014-12-11 LAB — HEMOGLOBIN A1C: HEMOGLOBIN A1C: 8.4 % — AB (ref 4.6–6.5)

## 2014-12-11 MED ORDER — HYDROMORPHONE HCL 4 MG PO TABS: 4.0000 mg | ORAL_TABLET | Freq: Four times a day (QID) | ORAL | Status: DC | PRN

## 2014-12-11 NOTE — Progress Notes (Signed)
Pre visit review using our clinic review tool, if applicable. No additional management support is needed unless otherwise documented below in the visit note. Pt declined to weigh 

## 2014-12-11 NOTE — Progress Notes (Signed)
   Subjective:    Patient ID: Amy [name], female    DOB: 1929-06-08, 81 y.o.   MRN: [id number]  HPI Here for 2 weeks of intermittent sharp left flank pains that start in the middle left back and radiate around to the LUQ of the abdomen. We saw her several times in February and March of this year for LUQ pains, but no etiology was ever found. Labs and a CT scan were all normal. She saw the GI office and they felt that the pains were most likely musculoskeletal in nature. For several days she has had some diarrhea. No fever, no nausea or vomiting. No UTI sx. She drinks plenty of water.    Review of Systems  Constitutional: Negative.   Respiratory: Negative.   Cardiovascular: Negative.   Gastrointestinal: Positive for abdominal pain and diarrhea. Negative for nausea, vomiting, constipation, blood in stool, abdominal distention, anal bleeding and rectal pain.  Genitourinary: Positive for flank pain. Negative for dysuria, urgency, frequency, hematuria, pelvic pain and dyspareunia.  Musculoskeletal: Positive for back pain.       Objective:   Physical Exam  Constitutional: She appears well-developed and well-nourished. No distress.  Neck: No thyromegaly present.  Cardiovascular: Normal rate, regular rhythm, normal heart sounds and intact distal pulses.   Pulmonary/Chest: Effort normal and breath sounds normal.  Abdominal: Soft. Bowel sounds are normal. She exhibits no distension and no mass. There is no tenderness. There is no rebound and no guarding.  Musculoskeletal: Normal range of motion. She exhibits no edema or tenderness.  Lymphadenopathy:    She has no cervical adenopathy.          Assessment & Plan:  Left flank pain of uncertain etiology. This is most likely musculoskeletal. Get some labs today to rule out pancreatitis, UTI, etc. Use Dilaudid prn. This may be due to a pinched nerve so we will set up a spinal MRI soon.

## 2014-12-11 NOTE — Addendum Note (Signed)
Addended by: [name] A on: 12/11/2014 03:38 PM   Modules accepted: Orders

## 2014-12-13 LAB — URINE CULTURE: Colony Count: 7000

## 2014-12-20 MED ORDER — GLIPIZIDE 10 MG PO TABS
10.0000 mg | ORAL_TABLET | Freq: Two times a day (BID) | ORAL | Status: DC
Start: 2014-12-20 — End: 2015-11-19

## 2014-12-20 NOTE — Addendum Note (Signed)
Addended by: Aggie Hacker A on: 12/20/2014 02:05 PM   Modules accepted: Orders, Medications

## 2014-12-26 ENCOUNTER — Ambulatory Visit
Admission: RE | Admit: 2014-12-26 | Discharge: 2014-12-26 | Disposition: A | Discharge status: Home / Self Care | Payer: Medicare Other | Source: Ambulatory Visit | Attending: Family Medicine | Admitting: Family Medicine

## 2014-12-26 DIAGNOSIS — R109 Unspecified abdominal pain: Secondary | ICD-10-CM

## 2014-12-27 ENCOUNTER — Encounter: Payer: Self-pay | Admitting: Gastroenterology

## 2015-01-11 ENCOUNTER — Encounter: Payer: Self-pay | Admitting: Family Medicine

## 2015-01-11 ENCOUNTER — Telehealth: Payer: Self-pay | Admitting: Family Medicine

## 2015-01-11 ENCOUNTER — Ambulatory Visit (INDEPENDENT_AMBULATORY_CARE_PROVIDER_SITE_OTHER): Payer: Medicare Other | Admitting: Family Medicine

## 2015-01-11 VITALS — BP 134/86 | HR 78 | Temp 98.5°F | Ht 67.0 in

## 2015-01-11 DIAGNOSIS — Z0000 Encounter for general adult medical examination without abnormal findings: Secondary | ICD-10-CM | POA: Diagnosis not present

## 2015-01-11 DIAGNOSIS — I10 Essential (primary) hypertension: Secondary | ICD-10-CM

## 2015-01-11 DIAGNOSIS — R1010 Upper abdominal pain, unspecified: Secondary | ICD-10-CM

## 2015-01-11 DIAGNOSIS — R109 Unspecified abdominal pain: Secondary | ICD-10-CM

## 2015-01-11 DIAGNOSIS — G8929 Other chronic pain: Secondary | ICD-10-CM

## 2015-01-11 LAB — LIPID PANEL
CHOL/HDL RATIO: 3
CHOLESTEROL: 117 mg/dL (ref 0–200)
HDL: 46.3 mg/dL (ref >39.00)
LDL Cholesterol: 49 mg/dL (ref 0–99)
NonHDL: 70.9
TRIGLYCERIDES: 108 mg/dL (ref 0.0–149.0)
VLDL: 21.6 mg/dL (ref 0.0–40.0)

## 2015-01-11 LAB — TSH: TSH: 2.15 u[IU]/mL (ref 0.35–4.50)

## 2015-01-11 MED ORDER — GABAPENTIN 100 MG PO CAPS: 100.0000 mg | ORAL_CAPSULE | Freq: Three times a day (TID) | ORAL | Status: DC

## 2015-01-11 NOTE — Progress Notes (Signed)
Pre visit review using our clinic review tool, if applicable. No additional management support is needed unless otherwise documented below in the visit note. Pt declined to weigh 

## 2015-01-11 NOTE — Telephone Encounter (Signed)
Pt call to ask if she can take her new medicine with her pain medicine. Would like a call back

## 2015-01-11 NOTE — Telephone Encounter (Signed)
To [employer]

## 2015-01-12 ENCOUNTER — Encounter: Payer: Self-pay | Admitting: Family Medicine

## 2015-01-12 NOTE — Telephone Encounter (Signed)
Told pt what dr fry said

## 2015-01-12 NOTE — Telephone Encounter (Signed)
Noted  

## 2015-01-12 NOTE — Progress Notes (Signed)
   Subjective:    Patient ID: Amy [name], female    DOB: [birth year], 81 y.o.   MRN: [id number]  HPI 81 yr old female for a cpx. She feels well in general but she is still struggling with a sharp intermittent left flank pain. She has been evaluated with labs, an abdominal CT scan, and MRIs of the spine, but no explanation has been found. It seems to me to be of nerve origin by its nature and the way it radiates, but she is insisting on getting more of a GI evaluation. No problems with urinations or BMs. Using Dilaudid prn. This alleviates the pain but it returns after a few hours.    Review of Systems  Constitutional: Negative.   HENT: Negative.   Eyes: Negative.   Respiratory: Negative.   Cardiovascular: Negative.   Gastrointestinal: Positive for abdominal pain. Negative for nausea, vomiting, diarrhea, constipation, blood in stool, abdominal distention, anal bleeding and rectal pain.  Genitourinary: Negative for dysuria, urgency, frequency, hematuria, flank pain, decreased urine volume, enuresis, difficulty urinating, pelvic pain and dyspareunia.  Musculoskeletal: Negative.   Skin: Negative.   Neurological: Negative.   Psychiatric/Behavioral: Negative.        Objective:   Physical Exam  Constitutional: She is oriented to person, place, and time. She appears well-developed and well-nourished. No distress.  HENT:  Head: Normocephalic and atraumatic.  Right Ear: External ear normal.  Left Ear: External ear normal.  Nose: Nose normal.  Mouth/Throat: Oropharynx is clear and moist. No oropharyngeal exudate.  Eyes: Conjunctivae and EOM are normal. Pupils are equal, round, and reactive to light. No scleral icterus.  Neck: Normal range of motion. Neck supple. No JVD present. No thyromegaly present.  Cardiovascular: Normal rate, regular rhythm, normal heart sounds and intact distal pulses.  Exam reveals no gallop and no friction rub.   No murmur heard. EKG normal   Pulmonary/Chest:  Effort normal and breath sounds normal. No respiratory distress. She has no wheezes. She has no rales. She exhibits no tenderness.  Abdominal: Soft. Bowel sounds are normal. She exhibits no distension and no mass. There is no tenderness. There is no rebound and no guarding.  Musculoskeletal: Normal range of motion. She exhibits no edema or tenderness.  Lymphadenopathy:    She has no cervical adenopathy.  Neurological: She is alert and oriented to person, place, and time. She has normal reflexes. No cranial nerve deficit. She exhibits normal muscle tone. Coordination normal.  Skin: Skin is warm and dry. No rash noted. No erythema.  Psychiatric: She has a normal mood and affect. Her behavior is normal. Judgment and thought content normal.          Assessment & Plan:  Well exam. Get fasting labs. We discussed diet and exercise advice. We will start her on Gabapentin for the flank pain to see if this helps. We will have her see GI again.

## 2015-01-12 NOTE — Telephone Encounter (Signed)
Yes she should take the new medication (gabapentin) along with her usual pain meds

## 2015-01-20 ENCOUNTER — Other Ambulatory Visit: Payer: Self-pay | Admitting: Internal Medicine

## 2015-01-30 ENCOUNTER — Ambulatory Visit (INDEPENDENT_AMBULATORY_CARE_PROVIDER_SITE_OTHER): Payer: Medicare Other | Admitting: Family Medicine

## 2015-01-30 ENCOUNTER — Encounter: Payer: Self-pay | Admitting: Family Medicine

## 2015-01-30 VITALS — BP 139/82 | HR 86 | Temp 98.3°F

## 2015-01-30 DIAGNOSIS — R1084 Generalized abdominal pain: Secondary | ICD-10-CM | POA: Diagnosis not present

## 2015-01-30 MED ORDER — GABAPENTIN 300 MG PO CAPS: 300.0000 mg | ORAL_CAPSULE | Freq: Three times a day (TID) | ORAL | Status: DC

## 2015-01-30 NOTE — Progress Notes (Signed)
Pre visit review using our clinic review tool, if applicable. No additional management support is needed unless otherwise documented below in the visit note. Pt declined to weigh 

## 2015-01-30 NOTE — Progress Notes (Signed)
   Subjective:    Patient ID: Amy [name], female    DOB: [birth year], 81 y.o.   MRN: [id number]  HPI Here to follow up on abdominal and flank pains. She has been taking Gabapentin 100 mg TID but this has not helped. Her symptoms have not changed otherwise. She is scheduled to see Dr. Deatra Ina for a GI evaluation on 03-14-15.    Review of Systems  Constitutional: Negative.   Respiratory: Negative.   Cardiovascular: Negative.   Gastrointestinal: Positive for abdominal pain. Negative for nausea, vomiting, diarrhea, constipation, blood in stool, abdominal distention, anal bleeding and rectal pain.  Genitourinary: Negative.        Objective:   Physical Exam  Constitutional: She is oriented to person, place, and time. She appears well-developed and well-nourished.  Cardiovascular: Normal rate, regular rhythm, normal heart sounds and intact distal pulses.   Pulmonary/Chest: Effort normal and breath sounds normal.  Abdominal: Soft. Bowel sounds are normal. She exhibits no distension and no mass. There is no tenderness. There is no rebound and no guarding.  Neurological: She is alert and oriented to person, place, and time.          Assessment & Plan:  Abdominal pain of uncertain etiology. This still sounds like a neurologic pain to me. We will increase the Gabapentin to 300 mg TID. Await the GI visit.

## 2015-02-07 ENCOUNTER — Telehealth: Payer: Self-pay | Admitting: Gastroenterology

## 2015-02-07 NOTE — Telephone Encounter (Signed)
Spoke with the patient and moved her appointment to 02/13/15

## 2015-02-13 ENCOUNTER — Ambulatory Visit (INDEPENDENT_AMBULATORY_CARE_PROVIDER_SITE_OTHER): Payer: Medicare Other | Admitting: Gastroenterology

## 2015-02-13 ENCOUNTER — Encounter: Payer: Self-pay | Admitting: Gastroenterology

## 2015-02-13 DIAGNOSIS — R109 Unspecified abdominal pain: Secondary | ICD-10-CM

## 2015-02-13 MED ORDER — AMITRIPTYLINE HCL 25 MG PO TABS: ORAL_TABLET | ORAL | Status: DC

## 2015-02-13 NOTE — Patient Instructions (Signed)
Your prescription will be sent to your pharmacy Your follow up appointment with Dr Deatra Ina is scheduled on 04/16/2015 at 3:45pm

## 2015-02-13 NOTE — Assessment & Plan Note (Signed)
Two-month history of very generalized but nonspecific abdominal pain.  Workup including CT of the abdomen and spine MRI have been unrevealing.  The patient looks well.  I think it is unlikely that pain is due to intra-abdominal disease.  She very clearly is depressed which I think is contributing if not causing her symptoms.  Recommendations #1 trial of Elavil 25 mg daily at bedtime.  If she tolerates this then will increase to 50 mg daily at bedtime.  She was instructed to contact me she has any side effects since she is also taking Neurontin

## 2015-02-13 NOTE — Progress Notes (Signed)
      History of Present Illness:  Amy [name] has returned for evaluation of abdominal pain and flank pain.  She has very poorly described diffuse pain across her mid and lower abdomen.  It is unrelated to eating, bowel movements or movement.  Pain is fairly constant and more noticeable when she is lying in bed and trying to sleep.  She does admit that, when she is distracted, pain is less.  Weight has been stable.  CT scan in March, 2016 was unremarkable.  Recent MRI of the spine demonstrated degenerative changes.  Pain has continued despite taking gabapentin.  Approximately a year ago she lost her husband.  A sister died about 6 months ago.  He describes that she has had a profound loss with the death of these 2 people.   Review of Systems: Pertinent positive and negative review of systems were noted in the above HPI section. All other review of systems were otherwise negative.    Current Medications, Allergies, Past Medical History, Past Surgical History, Family History and Social History were reviewed in [facility] record  Vital signs were reviewed in today's medical record. Physical Exam: General: Well developed , well nourished, no acute distress Skin: anicteric Head: Normocephalic and atraumatic Eyes:  sclerae anicteric, EOMI Ears: Normal auditory acuity Mouth: No deformity or lesions Lymph Nodes: no lymphadenopathy Lungs: Clear throughout to auscultation Heart: Regular rate and rhythm; no murmurs, rubs or brui: Gastroinestinal:  Soft, non tender and non distended. No masses, hepatosplenomegaly or hernias noted. Normal Bowel sounds Rectal:deferred Musculoskeletal: Symmetrical with no gross deformities  Pulses:  Normal pulses noted Extremities: No clubbing, cyanosis, edema or deformities noted Neurological: Alert oriented x 4, grossly nonfocal Psychological:  Alert and cooperative. Normal mood and affect  See Assessment and Plan under Problem List

## 2015-02-16 ENCOUNTER — Telehealth: Payer: Self-pay | Admitting: Gastroenterology

## 2015-02-16 NOTE — Telephone Encounter (Signed)
They will not cover generic either

## 2015-02-16 NOTE — Telephone Encounter (Signed)
Spoke with pharmacy, Medication for patient is only 17.79   Called patient to inform her the cost. Patient was ok with the cost. Pharmacy has it filled for her.

## 2015-02-16 NOTE — Telephone Encounter (Signed)
Please see what the cost is because it is an old generic medication

## 2015-02-16 NOTE — Telephone Encounter (Signed)
amytryptiline (generic for elavil) at the same dose

## 2015-02-16 NOTE — Telephone Encounter (Signed)
Dr Deatra Ina, Patients insurance will not cover Elavil.  What other medication can we send for her that will be affordable

## 2015-03-01 ENCOUNTER — Telehealth: Payer: Self-pay | Admitting: Gastroenterology

## 2015-03-01 NOTE — Telephone Encounter (Signed)
Dr. Fuller Plan please review and advise

## 2015-03-04 NOTE — Telephone Encounter (Signed)
OK to transfer if she understands that I will not be prescribing Elavil, pain medications or any that are not GI specific even if Dr. Deatra Ina did. She will need to see her PCP or other providers for those medications.

## 2015-03-05 NOTE — Telephone Encounter (Signed)
Patient notified She understands the constitions She is scheduled for follow up for 04/10/15 9:45

## 2015-03-12 ENCOUNTER — Encounter: Payer: Self-pay | Admitting: Family Medicine

## 2015-03-12 ENCOUNTER — Ambulatory Visit (INDEPENDENT_AMBULATORY_CARE_PROVIDER_SITE_OTHER): Payer: Medicare Other | Admitting: Family Medicine

## 2015-03-12 VITALS — BP 144/87 | HR 83 | Temp 98.5°F

## 2015-03-12 DIAGNOSIS — R1084 Generalized abdominal pain: Secondary | ICD-10-CM

## 2015-03-12 DIAGNOSIS — F329 Major depressive disorder, single episode, unspecified: Secondary | ICD-10-CM

## 2015-03-12 DIAGNOSIS — Z23 Encounter for immunization: Secondary | ICD-10-CM | POA: Diagnosis not present

## 2015-03-12 DIAGNOSIS — F32A Depression, unspecified: Secondary | ICD-10-CM | POA: Insufficient documentation

## 2015-03-12 MED ORDER — SERTRALINE HCL 50 MG PO TABS: 50.0000 mg | ORAL_TABLET | Freq: Every day | ORAL | Status: DC

## 2015-03-12 MED ORDER — HYDROMORPHONE HCL 4 MG PO TABS: 4.0000 mg | ORAL_TABLET | Freq: Four times a day (QID) | ORAL | Status: DC | PRN

## 2015-03-12 NOTE — Progress Notes (Signed)
Pre visit review using our clinic review tool, if applicable. No additional management support is needed unless otherwise documented below in the visit note. Pt declined to weigh 

## 2015-03-12 NOTE — Progress Notes (Signed)
   Subjective:    Patient ID: Amy [name], female    DOB: May 05, 1930, 81 y.o.   MRN: [id number]  HPI Here to follow up on abdominal pain. These continue on a daily basis, though they do wax and wane. Her appetite is down slightly, and she has lost 5 lbs. Her BMs are normal. She saw Dr. Deatra Ina a few weeks ago and he felt that her main problem may be depression. He tried her on low dose amitriptyline but she says this has not helped. The anniversary of her husband's death is coming up next week.    Review of Systems  Constitutional: Positive for appetite change, fatigue and unexpected weight change.  Respiratory: Negative.   Cardiovascular: Negative.   Gastrointestinal: Positive for abdominal pain. Negative for nausea, diarrhea, constipation and abdominal distention.  Genitourinary: Negative.   Neurological: Negative.   Psychiatric/Behavioral: Positive for dysphoric mood. Negative for behavioral problems, confusion, decreased concentration and agitation. The patient is nervous/anxious.        Objective:   Physical Exam  Constitutional: She is oriented to person, place, and time. She appears well-developed and well-nourished.  Cardiovascular: Normal rate, regular rhythm, normal heart sounds and intact distal pulses.   Pulmonary/Chest: Effort normal and breath sounds normal.  Abdominal: Soft. Bowel sounds are normal. She exhibits no distension and no mass. There is no rebound and no guarding.  Mild generalized abdominal tenderness  Neurological: She is alert and oriented to person, place, and time.  Psychiatric: Her behavior is normal. Thought content normal.  Depressed affect           Assessment & Plan:  It is likely that depression is a major component of her abdominal pain. We will stop amitriptyline and start her on Zoloft 50 mg daily. Recheck in 2-3 weeks.

## 2015-03-14 ENCOUNTER — Ambulatory Visit: Payer: Medicare Other | Admitting: Gastroenterology

## 2015-04-06 ENCOUNTER — Telehealth: Payer: Self-pay | Admitting: Family Medicine

## 2015-04-06 NOTE — Telephone Encounter (Signed)
It was her metFORMIN (GLUCOPHAGE) 500 MG tablet

## 2015-04-06 NOTE — Telephone Encounter (Signed)
Pt said the following med was increased to 4 times a day and now she short on her medicines. She need a rx that show the increase .    Ph[facility]

## 2015-04-06 NOTE — Telephone Encounter (Signed)
What medication

## 2015-04-10 ENCOUNTER — Encounter: Payer: Self-pay | Admitting: Gastroenterology

## 2015-04-10 ENCOUNTER — Ambulatory Visit (INDEPENDENT_AMBULATORY_CARE_PROVIDER_SITE_OTHER): Payer: Medicare Other | Admitting: Gastroenterology

## 2015-04-10 VITALS — BP 130/60 | HR 62 | Ht 67.0 in | Wt 195.5 lb

## 2015-04-10 DIAGNOSIS — R1012 Left upper quadrant pain: Secondary | ICD-10-CM

## 2015-04-10 DIAGNOSIS — G8929 Other chronic pain: Secondary | ICD-10-CM

## 2015-04-10 DIAGNOSIS — R1030 Lower abdominal pain, unspecified: Secondary | ICD-10-CM | POA: Diagnosis not present

## 2015-04-10 DIAGNOSIS — R109 Unspecified abdominal pain: Secondary | ICD-10-CM

## 2015-04-10 MED ORDER — METFORMIN HCL 1000 MG PO TABS: 1000.0000 mg | ORAL_TABLET | Freq: Two times a day (BID) | ORAL | Status: DC

## 2015-04-10 NOTE — Patient Instructions (Signed)
Follow up with your primary care physician.   Thank you for choosing me and [facility].  Malcolm T. Stark, Jr., MD., FACG  

## 2015-04-10 NOTE — Telephone Encounter (Signed)
Script was sent e-scribe 

## 2015-04-10 NOTE — Progress Notes (Signed)
    History of Present Illness: This is an 81 year old female previously followed by Dr. Deatra Ina. She had chronic LUQ, L flank pain without a GI cause found and recently some mild lower abdominal pain. CT scans in 07/2014 and 01/2011 reviewed. August 2016 thoracic and lumbar MRI reviewed. Symptoms felt to be related to depression or possible neuromuscular pain. LUQ, L flank pain has resolved. Mild lower abdominal pain comes and goes and is not related to meals or bowel movements. Now on Neurontin and Zoloft. Her mild lower abdominal pain has improved. Has 1-2 formed BMs daily.    Current Medications, Allergies, Past Medical History, Past Surgical History, Family History and Social History were reviewed in [employer] record.  Physical Exam: General: Well developed, well nourished, no acute distress Head: Normocephalic and atraumatic Eyes:  sclerae anicteric, EOMI Ears: Normal auditory acuity Mouth: No deformity or lesions Lungs: Clear throughout to auscultation Heart: Regular rate and rhythm; no murmurs, rubs or bruits Abdomen: Soft, minimal lower abdominal tenderness to light palpation and non distended. No masses, hepatosplenomegaly or hernias noted. Normal Bowel sounds Musculoskeletal: Symmetrical with no gross deformities  Pulses:  Normal pulses noted Extremities: No clubbing, cyanosis, edema or deformities noted Neurological: Alert oriented x 4, grossly nonfocal Psychological:  Alert and cooperative. Normal mood and affect  Assessment and Recommendations:  1. 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 Possibly functional pain related to depression. Continue current mgmt per Dr. Sarajane Jews with Neurontin and Zoloft. No further GI evaluation is planned at this time. GI follow up prn.

## 2015-04-16 ENCOUNTER — Ambulatory Visit: Payer: Medicare Other | Admitting: Gastroenterology

## 2015-05-23 ENCOUNTER — Ambulatory Visit (INDEPENDENT_AMBULATORY_CARE_PROVIDER_SITE_OTHER): Payer: Medicare Other | Admitting: Family Medicine

## 2015-05-23 ENCOUNTER — Encounter: Payer: Self-pay | Admitting: Family Medicine

## 2015-05-23 VITALS — BP 134/80 | HR 100 | Temp 97.9°F

## 2015-05-23 DIAGNOSIS — J019 Acute sinusitis, unspecified: Secondary | ICD-10-CM | POA: Diagnosis not present

## 2015-05-23 MED ORDER — AMOXICILLIN-POT CLAVULANATE 875-125 MG PO TABS: 1.0000 | ORAL_TABLET | Freq: Two times a day (BID) | ORAL | Status: DC

## 2015-05-23 NOTE — Progress Notes (Signed)
   Subjective:    Patient ID: Amy [name], female    DOB: 1930-05-18, 81 y.o.   MRN: [id number]  HPI Here for 5 days of stuffy head, blowing green mucus from the nose, PND, and a dry cough. No fever. On Mucinex DM.   Review of Systems  Constitutional: Negative.   HENT: Positive for congestion, postnasal drip and sinus pressure. Negative for ear pain and sore throat.   Eyes: Negative.   Respiratory: Positive for cough.        Objective:   Physical Exam  Constitutional: She appears well-developed and well-nourished.  HENT:  Right Ear: External ear normal.  Left Ear: External ear normal.  Nose: Nose normal.  Mouth/Throat: Oropharynx is clear and moist.  Eyes: Conjunctivae are normal.  Neck: No thyromegaly present.  Pulmonary/Chest: Effort normal and breath sounds normal. No respiratory distress. She has no wheezes. She has no rales.  Lymphadenopathy:    She has no cervical adenopathy.          Assessment & Plan:  Sinusitis, treat with Augmentin.

## 2015-05-23 NOTE — Progress Notes (Signed)
Pre visit review using our clinic review tool, if applicable. No additional management support is needed unless otherwise documented below in the visit note. Pt declined to weigh 

## 2015-06-01 ENCOUNTER — Ambulatory Visit (INDEPENDENT_AMBULATORY_CARE_PROVIDER_SITE_OTHER): Payer: Medicare Other | Admitting: Family Medicine

## 2015-06-01 ENCOUNTER — Encounter: Payer: Self-pay | Admitting: Family Medicine

## 2015-06-01 VITALS — BP 154/100 | HR 77 | Temp 98.4°F

## 2015-06-01 DIAGNOSIS — J019 Acute sinusitis, unspecified: Secondary | ICD-10-CM | POA: Diagnosis not present

## 2015-06-01 MED ORDER — LEVOFLOXACIN 500 MG PO TABS: 500.0000 mg | ORAL_TABLET | Freq: Every day | ORAL | Status: AC

## 2015-06-01 NOTE — Progress Notes (Signed)
Pre visit review using our clinic review tool, if applicable. No additional management support is needed unless otherwise documented below in the visit note. Pt declined to weigh 

## 2015-06-01 NOTE — Progress Notes (Signed)
   Subjective:    Patient ID: Amy [name], female    DOB: 06-18-1929, 82 y.o.   MRN: [id number]  HPI Here for continued sinus pressure, PND, and coughing up yellow sputum. No fever. She finished a round of Augmentin and improved but tese symptoms returned.    Review of Systems  Constitutional: Negative.   HENT: Positive for congestion, postnasal drip and sinus pressure. Negative for sore throat.   Eyes: Negative.   Respiratory: Positive for cough.        Objective:   Physical Exam  Constitutional: She appears well-developed and well-nourished.  HENT:  Right Ear: External ear normal.  Left Ear: External ear normal.  Nose: Nose normal.  Mouth/Throat: Oropharynx is clear and moist.  Eyes: Conjunctivae are normal.  Neck: No thyromegaly present.  Pulmonary/Chest: Effort normal and breath sounds normal.  Lymphadenopathy:    She has no cervical adenopathy.          Assessment & Plan:  Partially treated sinusitis. Given Levaquin.

## 2015-07-02 ENCOUNTER — Encounter: Payer: Self-pay | Admitting: Family Medicine

## 2015-07-02 ENCOUNTER — Ambulatory Visit (INDEPENDENT_AMBULATORY_CARE_PROVIDER_SITE_OTHER): Payer: Medicare Other | Admitting: Family Medicine

## 2015-07-02 VITALS — BP 168/96 | HR 93 | Temp 98.6°F

## 2015-07-02 DIAGNOSIS — R42 Dizziness and giddiness: Secondary | ICD-10-CM

## 2015-07-02 DIAGNOSIS — R1013 Epigastric pain: Secondary | ICD-10-CM

## 2015-07-02 LAB — CBC WITH DIFFERENTIAL/PLATELET
BASOS ABS: 0 10*3/uL (ref 0.0–0.1)
BASOS PCT: 0.4 % (ref 0.0–3.0)
EOS ABS: 0.1 10*3/uL (ref 0.0–0.7)
Eosinophils Relative: 1 % (ref 0.0–5.0)
HEMATOCRIT: 38.8 % (ref 36.0–46.0)
Hemoglobin: 12.7 g/dL (ref 12.0–15.0)
LYMPHS ABS: 2.8 10*3/uL (ref 0.7–4.0)
Lymphocytes Relative: 21.7 % (ref 12.0–46.0)
MCHC: 32.8 g/dL (ref 30.0–36.0)
MCV: 91.2 fl (ref 78.0–100.0)
MONO ABS: 0.9 10*3/uL (ref 0.1–1.0)
Monocytes Relative: 7.3 % (ref 3.0–12.0)
NEUTROS ABS: 8.9 10*3/uL — AB (ref 1.4–7.7)
NEUTROS PCT: 69.6 % (ref 43.0–77.0)
PLATELETS: 244 10*3/uL (ref 150.0–400.0)
RBC: 4.26 Mil/uL (ref 3.87–5.11)
RDW: 13.7 % (ref 11.5–15.5)
WBC: 12.8 10*3/uL — ABNORMAL HIGH (ref 4.0–10.5)

## 2015-07-02 LAB — HEPATIC FUNCTION PANEL
ALT: 15 U/L (ref 0–35)
AST: 16 U/L (ref 0–37)
Albumin: 4.3 g/dL (ref 3.5–5.2)
Alkaline Phosphatase: 83 U/L (ref 39–117)
BILIRUBIN DIRECT: 0.2 mg/dL (ref 0.0–0.3)
TOTAL PROTEIN: 7.6 g/dL (ref 6.0–8.3)
Total Bilirubin: 0.8 mg/dL (ref 0.2–1.2)

## 2015-07-02 LAB — BASIC METABOLIC PANEL
BUN: 11 mg/dL (ref 6–23)
CHLORIDE: 101 meq/L (ref 96–112)
CO2: 29 meq/L (ref 19–32)
CREATININE: 0.71 mg/dL (ref 0.40–1.20)
Calcium: 9.6 mg/dL (ref 8.4–10.5)
GFR: 83.47 mL/min (ref >60.00)
GLUCOSE: 72 mg/dL (ref 70–99)
Potassium: 3.7 mEq/L (ref 3.5–5.1)
Sodium: 139 mEq/L (ref 135–145)

## 2015-07-02 LAB — LIPASE: Lipase: 56 U/L (ref 11.0–59.0)

## 2015-07-02 LAB — TSH: TSH: 5.3 u[IU]/mL — AB (ref 0.35–4.50)

## 2015-07-02 MED ORDER — OMEPRAZOLE 40 MG PO CPDR: 40.0000 mg | DELAYED_RELEASE_CAPSULE | Freq: Every day | ORAL | Status: DC

## 2015-07-02 NOTE — Progress Notes (Signed)
   Subjective:    Patient ID: Amy [name], female    DOB: 06-04-1929, 82 y.o.   MRN: [id number]  HPI Here for one month of several symptoms including upper abdominal pain and nausea without vomiting. No fever. She also has belching at times. BMs are normal. She takes Pepcid at times. She also describes frequent dizziness which she describes ad lightheadedness or loss of balance. She has fallen at home twice, which she has never done before. No injuries. No headaches.    Review of Systems  Constitutional: Negative.   HENT: Negative.   Eyes: Negative.   Respiratory: Negative.   Cardiovascular: Negative.   Gastrointestinal: Positive for nausea and abdominal pain. Negative for vomiting, diarrhea, constipation, blood in stool, abdominal distention, anal bleeding and rectal pain.  Genitourinary: Negative.   Neurological: Positive for dizziness and light-headedness. Negative for tremors, seizures, syncope, facial asymmetry, speech difficulty, weakness, numbness and headaches.       Objective:   Physical Exam  Constitutional: She is oriented to person, place, and time. She appears well-developed and well-nourished. No distress.  Eyes: Conjunctivae and EOM are normal. Pupils are equal, round, and reactive to light.  Neck: No thyromegaly present.  Cardiovascular: Normal rate, regular rhythm, normal heart sounds and intact distal pulses.   Pulmonary/Chest: Effort normal and breath sounds normal.  Abdominal: Soft. Bowel sounds are normal. She exhibits no distension and no mass. There is no tenderness. There is no rebound and no guarding.  Lymphadenopathy:    She has no cervical adenopathy.  Neurological: She is alert and oriented to person, place, and time. No cranial nerve deficit. She exhibits normal muscle tone. Coordination normal.          Assessment & Plan:  She has epigastric pain and nausea which may be from duodenitis or an ulcer. Try Omeprazole 40 mg to take daily. Also get  labs today. For the dizziness we will set up a brain MRI and carotid dopplers soon.

## 2015-07-02 NOTE — Progress Notes (Signed)
Pre visit review using our clinic review tool, if applicable. No additional management support is needed unless otherwise documented below in the visit note. Pt declined to weigh 

## 2015-07-04 ENCOUNTER — Telehealth: Payer: Self-pay | Admitting: Family Medicine

## 2015-07-04 MED ORDER — LEVOTHYROXINE SODIUM 175 MCG PO TABS
175.0000 ug | ORAL_TABLET | Freq: Every day | ORAL | Status: DC
Start: 2015-07-04 — End: 2016-05-07

## 2015-07-04 NOTE — Telephone Encounter (Signed)
Please see lab result note.

## 2015-07-04 NOTE — Telephone Encounter (Signed)
Patient returned the RN's telephone call.  Please call her at home after 2pm today.

## 2015-07-04 NOTE — Addendum Note (Signed)
Addended by: Aggie Hacker A on: 07/04/2015 01:50 PM   Modules accepted: Orders, Medications

## 2015-07-17 ENCOUNTER — Encounter: Payer: Self-pay | Admitting: Family Medicine

## 2015-07-17 ENCOUNTER — Ambulatory Visit (INDEPENDENT_AMBULATORY_CARE_PROVIDER_SITE_OTHER): Payer: Medicare Other | Admitting: Family Medicine

## 2015-07-17 VITALS — BP 138/82 | HR 117 | Temp 98.7°F

## 2015-07-17 DIAGNOSIS — R1013 Epigastric pain: Secondary | ICD-10-CM

## 2015-07-17 DIAGNOSIS — G8929 Other chronic pain: Secondary | ICD-10-CM

## 2015-07-17 NOTE — Progress Notes (Signed)
   Subjective:    Patient ID: Amy [name], female    DOB: 1929/09/11, 82 y.o.   MRN: [id number]  HPI Here for worsening upper abdominal pains wit nausea and vomiting. She has been complaining of abdominal pains for about a year, but an etiology has been elusive. She is taking Omeprazole with little relief. She had a normal CT scan of the abdomen and pelvis last March. She has had pain in various locations around the abdomen but now it is centered in the epigastrium. She has daily nausea and vomits about twice a week. Her BMs are regular. No urinary symptoms.    Review of Systems  Constitutional: Negative.   Respiratory: Negative.   Cardiovascular: Negative.   Gastrointestinal: Positive for abdominal pain. Negative for vomiting, diarrhea, constipation, blood in stool, abdominal distention, anal bleeding and rectal pain.  Genitourinary: Negative.        Objective:   Physical Exam  Constitutional: She appears well-developed and well-nourished. No distress.  Cardiovascular: Normal rate, regular rhythm, normal heart sounds and intact distal pulses.   Pulmonary/Chest: Effort normal and breath sounds normal.  Abdominal: Soft. Bowel sounds are normal. She exhibits no distension and no mass. There is no rebound and no guarding.  Tender in the epigastrium, no HSM           Assessment & Plan:  Epigastric pain and vomiting. We will have her see GI again.

## 2015-07-17 NOTE — Progress Notes (Signed)
Pre visit review using our clinic review tool, if applicable. No additional management support is needed unless otherwise documented below in the visit note. Pt declined to weigh 

## 2015-07-20 ENCOUNTER — Encounter: Payer: Self-pay | Admitting: Family Medicine

## 2015-07-20 ENCOUNTER — Ambulatory Visit (INDEPENDENT_AMBULATORY_CARE_PROVIDER_SITE_OTHER): Payer: Medicare Other | Admitting: Family Medicine

## 2015-07-20 VITALS — BP 125/89 | HR 97 | Temp 98.6°F

## 2015-07-20 DIAGNOSIS — H6123 Impacted cerumen, bilateral: Secondary | ICD-10-CM

## 2015-07-20 NOTE — Progress Notes (Signed)
Pre visit review using our clinic review tool, if applicable. No additional management support is needed unless otherwise documented below in the visit note. Pt declined to weigh 

## 2015-07-23 ENCOUNTER — Encounter: Payer: Self-pay | Admitting: Family Medicine

## 2015-07-23 NOTE — Progress Notes (Signed)
   Subjective:    Patient ID: Amy [name], female    DOB: 1930/04/29, 82 y.o.   MRN: [id number]  HPI Here for the sudden decrease of hearing in the right ear 2 days ago. No pain. No sinus congestion.   Review of Systems  Constitutional: Negative.   HENT: Positive for hearing loss. Negative for congestion, ear pain, postnasal drip, sinus pressure and sore throat.   Eyes: Negative.   Respiratory: Negative.        Objective:   Physical Exam  Constitutional: She appears well-developed and well-nourished.  HENT:  Nose: Nose normal.  Mouth/Throat: Oropharynx is clear and moist.  Right ear canal is blocked with cerumen, the left ear is partially blocked   Eyes: Conjunctivae are normal.  Neck: No thyromegaly present.  Cardiovascular: Normal rate, regular rhythm, normal heart sounds and intact distal pulses.   Pulmonary/Chest: Effort normal and breath sounds normal.  Lymphadenopathy:    She has no cervical adenopathy.          Assessment & Plan:  Cerumen impactions. We attempted to irrigate the ears with water but were not successful. We will refer her to ENT for this.

## 2015-07-24 ENCOUNTER — Ambulatory Visit
Admission: RE | Admit: 2015-07-24 | Discharge: 2015-07-24 | Disposition: A | Discharge status: Home / Self Care | Payer: Medicare Other | Source: Ambulatory Visit | Attending: Family Medicine | Admitting: Family Medicine

## 2015-07-24 DIAGNOSIS — R42 Dizziness and giddiness: Secondary | ICD-10-CM

## 2015-07-24 MED ORDER — GADOBENATE DIMEGLUMINE 529 MG/ML IV SOLN
18.0000 mL | Freq: Once | INTRAVENOUS | Status: AC | PRN
  Administered 2015-07-24: 18 mL via INTRAVENOUS

## 2015-07-27 ENCOUNTER — Encounter: Payer: Self-pay | Admitting: Family Medicine

## 2015-07-27 ENCOUNTER — Ambulatory Visit (INDEPENDENT_AMBULATORY_CARE_PROVIDER_SITE_OTHER): Payer: Medicare Other | Admitting: Family Medicine

## 2015-07-27 VITALS — BP 138/75 | HR 82 | Temp 98.3°F

## 2015-07-27 DIAGNOSIS — R29818 Other symptoms and signs involving the nervous system: Secondary | ICD-10-CM

## 2015-07-27 DIAGNOSIS — R2689 Other abnormalities of gait and mobility: Secondary | ICD-10-CM | POA: Insufficient documentation

## 2015-07-27 DIAGNOSIS — R42 Dizziness and giddiness: Secondary | ICD-10-CM | POA: Diagnosis not present

## 2015-07-27 NOTE — Progress Notes (Signed)
Pre visit review using our clinic review tool, if applicable. No additional management support is needed unless otherwise documented below in the visit note. Pt declined to weigh 

## 2015-07-27 NOTE — Progress Notes (Signed)
   Subjective:    Patient ID: Amy [name], female    DOB: February 19, 1930, 82 y.o.   MRN: [id number]  HPI Here to follow up on balance problems and some recent falls and to go over results of a brain MRI obtained recently. 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 I think these may play a role in her recent balance problems. No new symptoms to report today.    Review of Systems  Constitutional: Negative.   Respiratory: Negative.   Cardiovascular: Negative.   Musculoskeletal: Positive for gait problem.  Neurological: Positive for dizziness. Negative for tremors, seizures, syncope, facial asymmetry, speech difficulty, weakness, light-headedness, numbness and headaches.       Objective:   Physical Exam  Constitutional: She is oriented to person, place, and time. She appears well-developed and well-nourished.  Cardiovascular: Normal rate, regular rhythm, normal heart sounds and intact distal pulses.   Pulmonary/Chest: Effort normal and breath sounds normal.  Neurological: She is alert and oriented to person, place, and time. No cranial nerve deficit. She exhibits normal muscle tone. Coordination normal.          Assessment & Plan:  Dizziness and balance issues, we will refer her to Neurology for further evaluation

## 2015-07-31 ENCOUNTER — Encounter: Payer: Self-pay | Admitting: Gastroenterology

## 2015-07-31 ENCOUNTER — Ambulatory Visit (INDEPENDENT_AMBULATORY_CARE_PROVIDER_SITE_OTHER): Payer: Medicare Other | Admitting: Gastroenterology

## 2015-07-31 VITALS — BP 120/80 | HR 82 | Ht 67.0 in | Wt 190.8 lb

## 2015-07-31 DIAGNOSIS — R1013 Epigastric pain: Secondary | ICD-10-CM

## 2015-07-31 DIAGNOSIS — F329 Major depressive disorder, single episode, unspecified: Secondary | ICD-10-CM | POA: Diagnosis not present

## 2015-07-31 DIAGNOSIS — R1012 Left upper quadrant pain: Secondary | ICD-10-CM

## 2015-07-31 DIAGNOSIS — F32A Depression, unspecified: Secondary | ICD-10-CM

## 2015-07-31 DIAGNOSIS — R112 Nausea with vomiting, unspecified: Secondary | ICD-10-CM

## 2015-07-31 NOTE — Progress Notes (Signed)
    History of Present Illness: This is an 82 year old female previously followed by Dr. Deatra Ina. She has chronic LUQ, L flank pain without a GI cause found. CT scans in 07/2014 and 01/2011 reviewed. August 2016 thoracic and lumbar MRI reviewed. Symptoms felt to be related to depression or possible neuromuscular pain.  She was evaluated for mild lower abdominal pain in November 2016 by me. The pain was poorly defined and not related to meals or bowel movements. The lower abdominal pain has improved. She has 1-2 formed BMs daily. She now notes several weeks of poorly defined epigastric pain in addition to her chronic left upper quadrant and left flank pain. She states the symptoms are fairly constant and not related to meals. She has had a few episodes of nausea and vomiting and all occurred in social situations when she was eating a meal out. Her daughter was recently diagnosed with Huntington's disease and she is very upset by this news.  Current Medications, Allergies, Past Medical History, Past Surgical History, Family History and Social History were reviewed in [employer] record.  Physical Exam: General: Well developed, well nourished, no acute distress Head: Normocephalic and atraumatic Eyes:  sclerae anicteric, EOMI Ears: Normal auditory acuity Mouth: No deformity or lesions Lungs: Clear throughout to auscultation Heart: Regular rate and rhythm; no murmurs, rubs or bruits Abdomen: Soft, minimal epigastric and LUQ tenderness without rebound of guarding and non distended. No masses, hepatosplenomegaly or hernias noted. Normal Bowel sounds Musculoskeletal: Symmetrical with no gross deformities  Pulses:  Normal pulses noted Extremities: No clubbing, cyanosis, edema or deformities noted Neurological: Alert oriented x 4, grossly nonfocal Psychological:  Alert and cooperative. Depressed affect  Assessment and Recommendations:  1. Chronic L flank, LUQ pain now with  epigastric pain and at her last visit she had lower abdominal pain. Symptoms likely neuromuscular related to lumbar and thoracic spine disease and/or functional. She likely has underlying depression. Schedule EGD to R/O ulcer, gastric neoplasm, GERD, etc. The risks (including bleeding, perforation, infection, missed lesions, medication reactions and possible hospitalization or surgery if complications occur), benefits, and alternatives to endoscopy with possible biopsy and possible dilation were discussed with the patient and they consent to proceed. If the EGD is unremarkable no further gastrointestinal evaluation is necessary at this time and she is advised to follow up with Dr. Sarajane Jews.   2. Intermittent nausea and vomiting occurring in social situations associated with meals. EGD for further evaluation however these symptoms are likely related depression and/or anxiety related.  3. Depression, possible anxiety. Daughter was recently diagnosed with Huntington's disease. These symptoms are not optimally controlled. This could be the cause of her ongoing vague abdominal complaints. Follow up with Dr. Sarajane Jews.

## 2015-07-31 NOTE — Patient Instructions (Signed)
You have been scheduled for an endoscopy. Please follow written instructions given to you at your visit today. If you use inhalers (even only as needed), please bring them with you on the day of your procedure. Your physician has requested that you go to www.startemmi.com and enter the access code given to you at your visit today. This web site gives a general overview about your procedure. However, you should still follow specific instructions given to you by our office regarding your preparation for the procedure.  Thank you for choosing me and [facility].  Malcolm T. Stark, Jr., MD., FACG  

## 2015-08-13 ENCOUNTER — Ambulatory Visit (INDEPENDENT_AMBULATORY_CARE_PROVIDER_SITE_OTHER): Payer: Medicare Other | Admitting: Family Medicine

## 2015-08-13 ENCOUNTER — Encounter: Payer: Self-pay | Admitting: Family Medicine

## 2015-08-13 VITALS — BP 136/78 | HR 80 | Temp 98.3°F

## 2015-08-13 DIAGNOSIS — F32A Depression, unspecified: Secondary | ICD-10-CM

## 2015-08-13 DIAGNOSIS — F329 Major depressive disorder, single episode, unspecified: Secondary | ICD-10-CM | POA: Diagnosis not present

## 2015-08-13 DIAGNOSIS — R1084 Generalized abdominal pain: Secondary | ICD-10-CM

## 2015-08-13 MED ORDER — SERTRALINE HCL 100 MG PO TABS: 100.0000 mg | ORAL_TABLET | Freq: Every day | ORAL | Status: DC

## 2015-08-13 NOTE — Progress Notes (Signed)
Pre visit review using our clinic review tool, if applicable. No additional management support is needed unless otherwise documented below in the visit note. Pt declined to weigh 

## 2015-08-13 NOTE — Progress Notes (Signed)
   Subjective:    Patient ID: Amy [name], female    DOB: [birth year], 82 y.o.   MRN: [id number]  HPI Here for advice about her chronic abdominal pain. She saw Dr. Fuller Plan recently and she is scheduled for an upper endoscopy in a few weeks. She has been very stressed about her daughter's diagnosis and the whole family went ot a seminar about Huntingtons disease last weekend. She found this informative but very depressing. She is often tearful and worries about her family.    Review of Systems  Constitutional: Negative.   Respiratory: Negative.   Gastrointestinal: Positive for nausea and abdominal pain. Negative for vomiting, diarrhea, constipation, blood in stool, abdominal distention and anal bleeding.  Neurological: Negative.   Psychiatric/Behavioral: Positive for dysphoric mood. The patient is nervous/anxious.        Objective:   Physical Exam  Constitutional: She is oriented to person, place, and time. She appears well-developed and well-nourished.  Cardiovascular: Normal rate, regular rhythm, normal heart sounds and intact distal pulses.   Pulmonary/Chest: Effort normal and breath sounds normal.  Abdominal: Soft. Bowel sounds are normal. She exhibits no distension. There is no tenderness. There is no rebound and no guarding.  Neurological: She is alert and oriented to person, place, and time.  Psychiatric: Her behavior is normal. Thought content normal.          Assessment & Plan:  Her depression and anxiety are not well controlled. Increase Zoloft to 100 mg daily.

## 2015-08-24 ENCOUNTER — Ambulatory Visit: Payer: Medicare Other | Admitting: Neurology

## 2015-08-30 ENCOUNTER — Ambulatory Visit (AMBULATORY_SURGERY_CENTER): Payer: Medicare Other | Admitting: Gastroenterology

## 2015-08-30 ENCOUNTER — Encounter: Payer: Self-pay | Admitting: Gastroenterology

## 2015-08-30 VITALS — BP 167/69 | HR 64 | Temp 97.8°F | Resp 17 | Ht 67.0 in | Wt 190.0 lb

## 2015-08-30 DIAGNOSIS — R1012 Left upper quadrant pain: Secondary | ICD-10-CM

## 2015-08-30 DIAGNOSIS — R1013 Epigastric pain: Secondary | ICD-10-CM

## 2015-08-30 DIAGNOSIS — R112 Nausea with vomiting, unspecified: Secondary | ICD-10-CM

## 2015-08-30 LAB — GLUCOSE, CAPILLARY
GLUCOSE-CAPILLARY: 136 mg/dL — AB (ref 65–99)
Glucose-Capillary: 130 mg/dL — ABNORMAL HIGH (ref 65–99)

## 2015-08-30 MED ORDER — SODIUM CHLORIDE 0.9 % IV SOLN: 500.0000 mL | INTRAVENOUS | Status: DC

## 2015-08-30 NOTE — Patient Instructions (Signed)
YOU HAD AN ENDOSCOPIC PROCEDURE TODAY AT THE [facility]:   Refer to the procedure report that was given to you for any specific questions about what was found during the examination.  If the procedure report does not answer your questions, please call your gastroenterologist to clarify.  If you requested that your care partner not be given the details of your procedure findings, then the procedure report has been included in a sealed envelope for you to review at your convenience later.  YOU SHOULD EXPECT: Some feelings of bloating in the abdomen. Passage of more gas than usual.  Walking can help get rid of the air that was put into your GI tract during the procedure and reduce the bloating. If you had a lower endoscopy (such as a colonoscopy or flexible sigmoidoscopy) you may notice spotting of blood in your stool or on the toilet paper. If you underwent a bowel prep for your procedure, you may not have a normal bowel movement for a few days.  Please Note:  You might notice some irritation and congestion in your nose or some drainage.  This is from the oxygen used during your procedure.  There is no need for concern and it should clear up in a day or so.  SYMPTOMS TO REPORT IMMEDIATELY:    Following upper endoscopy (EGD)  Vomiting of blood or coffee ground material  New chest pain or pain under the shoulder blades  Painful or persistently difficult swallowing  New shortness of breath  Fever of 100F or higher  Black, tarry-looking stools  For urgent or emergent issues, a gastroenterologist can be reached at any hour by calling [phone].   DIET: Your first meal following the procedure should be a small meal and then it is ok to progress to your normal diet. Heavy or fried foods are harder to digest and may make you feel nauseous or bloated.  Likewise, meals heavy in dairy and vegetables can increase bloating.  Drink plenty of fluids but you should avoid alcoholic beverages  for 24 hours.  ACTIVITY:  You should plan to take it easy for the rest of today and you should NOT DRIVE or use heavy machinery until tomorrow (because of the sedation medicines used during the test).    FOLLOW UP: Our staff will call the number listed on your records the next business day following your procedure to check on you and address any questions or concerns that you may have regarding the information given to you following your procedure. If we do not reach you, we will leave a message.  However, if you are feeling well and you are not experiencing any problems, there is no need to return our call.  We will assume that you have returned to your regular daily activities without incident.  If any biopsies were taken you will be contacted by phone or by letter within the next 1-3 weeks.  Please call us at [phone] if you have not heard about the biopsies in 3 weeks.    SIGNATURES/CONFIDENTIALITY: You and/or your care partner have signed paperwork which will be entered into your electronic medical record.  These signatures attest to the fact that that the information above on your After Visit Summary has been reviewed and is understood.  Full responsibility of the confidentiality of this discharge information lies with you and/or your care-partner. 

## 2015-08-30 NOTE — Progress Notes (Signed)
Patient took hydrocodone for pain yesterday, and daily for the past week. It is an old prescription.  Patient states that the script is about four or five months old.  States obtained it for the same pain she is having now.

## 2015-08-30 NOTE — Progress Notes (Signed)
A/ox3 pleased with MAC, report to Jane RN 

## 2015-08-30 NOTE — Op Note (Signed)
[facility] Patient Name: Amy [name] Procedure Date: 08/30/2015 10:48 AM MRN: [id number] Endoscopist: Ladene Artist , MD Age: 82 Date of Birth: [birth year] Gender: Female Procedure:                Upper GI endoscopy Indications:              Epigastric abdominal pain, Abdominal pain in the                            left upper quadrant Medicines:                Monitored Anesthesia Care Procedure:                Pre-Anesthesia Assessment:                           - Prior to the procedure, a History and Physical                            was performed, and patient medications and                            allergies were reviewed. The patient's tolerance of                            previous anesthesia was also reviewed. The risks                            and benefits of the procedure and the sedation                            options and risks were discussed with the patient.                            All questions were answered, and informed consent                            was obtained. Prior Anticoagulants: The patient has                            taken no previous anticoagulant or antiplatelet                            agents. ASA Grade Assessment: II - A patient with                            mild systemic disease. After reviewing the risks                            and benefits, the patient was deemed in                            satisfactory condition to undergo the procedure.  After obtaining informed consent, the endoscope was                            passed under direct vision. Throughout the                            procedure, the patient's blood pressure, pulse, and                            oxygen saturations were monitored continuously. The                            Model GIF-HQ190 [id number]) scope was introduced                            through the mouth, and advanced to the second part        of duodenum. The upper GI endoscopy was                            accomplished without difficulty. The patient                            tolerated the procedure well. Scope In: Scope Out: Findings:                 A mild Schatzki ring (acquired) was found at the                            gastroesophageal junction.                           The exam of the esophagus was otherwise normal.                           The entire examined stomach was normal.                           The duodenal bulb and second portion of the                            duodenum were normal.                           The cardia and gastric fundus were normal on                            retroflexion. Complications:            No immediate complications. Estimated Blood Loss:     Estimated blood loss: none. Impression:               - Mild Schatzki ring.                           - Normal stomach.                           -  Normal duodenal bulb and second portion of the                            duodenum. Recommendation:           - Patient has a contact number available for                            emergencies. The signs and symptoms of potential                            delayed complications were discussed with the                            patient. Return to normal activities tomorrow.                            Written discharge instructions were provided to the                            patient.                           - Resume previous diet.                           - Continue present medications.                           - No GI pathology found to explain her abdominal                            pain. No further GI evaluation needed at this time.                            Return to Dr. Sarajane Jews for ongoing care Ladene Artist, MD 08/30/2015 11:08:36 AM This report has been signed electronically.

## 2015-08-31 ENCOUNTER — Telehealth: Payer: Self-pay | Admitting: *Deleted

## 2015-08-31 NOTE — Telephone Encounter (Signed)
  Follow up Call-  Call back number 08/30/2015  Post procedure Call Back phone  # [phone]  Permission to leave phone message Yes     Patient questions:  Do you have a fever, pain , or abdominal swelling? No. Pain Score  0 *  Have you tolerated food without any problems? Yes.    Have you been able to return to your normal activities? Yes.    Do you have any questions about your discharge instructions: Diet   No. Medications  No. Follow up visit  No.  Do you have questions or concerns about your Care? No.  Actions: * If pain score is 4 or above: No action needed, pain <4.

## 2015-09-03 NOTE — Telephone Encounter (Signed)
Error.  No telephone call. maw

## 2015-09-10 ENCOUNTER — Ambulatory Visit (INDEPENDENT_AMBULATORY_CARE_PROVIDER_SITE_OTHER): Payer: Medicare Other | Admitting: Family Medicine

## 2015-09-10 ENCOUNTER — Encounter: Payer: Self-pay | Admitting: Family Medicine

## 2015-09-10 VITALS — BP 146/87 | HR 70 | Temp 98.8°F

## 2015-09-10 DIAGNOSIS — R109 Unspecified abdominal pain: Secondary | ICD-10-CM | POA: Diagnosis not present

## 2015-09-10 DIAGNOSIS — F329 Major depressive disorder, single episode, unspecified: Secondary | ICD-10-CM

## 2015-09-10 DIAGNOSIS — F32A Depression, unspecified: Secondary | ICD-10-CM

## 2015-09-10 MED ORDER — CYCLOBENZAPRINE HCL 10 MG PO TABS: 10.0000 mg | ORAL_TABLET | Freq: Three times a day (TID) | ORAL | Status: DC | PRN

## 2015-09-10 MED ORDER — HYDROMORPHONE HCL 4 MG PO TABS: 4.0000 mg | ORAL_TABLET | ORAL | Status: DC | PRN

## 2015-09-10 NOTE — Progress Notes (Signed)
Pre visit review using our clinic review tool, if applicable. No additional management support is needed unless otherwise documented below in the visit note. Pt declined to weigh 

## 2015-09-10 NOTE — Progress Notes (Signed)
   Subjective:    Patient ID: Amy [name], female    DOB: December 11, 1929, 82 y.o.   MRN: [id number]  HPI Here for chronic abdominal pain and depression. At our last visit we increased Zoloft to 100 mg daily, and this has helped her depression. However the pain has not improved. She had an upper endoscopy recently which was unremarkable. She is very frustrated at the pain and the fact that it is so elusive. No new symptoms.    Review of Systems  Constitutional: Negative.   Respiratory: Negative.   Cardiovascular: Negative.   Gastrointestinal: Positive for abdominal pain. Negative for nausea, vomiting, diarrhea, constipation, blood in stool, abdominal distention, anal bleeding and rectal pain.  Psychiatric/Behavioral: Positive for dysphoric mood. The patient is nervous/anxious.        Objective:   Physical Exam  Constitutional: She is oriented to person, place, and time. She appears well-developed and well-nourished.  Neck: No thyromegaly present.  Cardiovascular: Normal rate, regular rhythm, normal heart sounds and intact distal pulses.   Pulmonary/Chest: Effort normal and breath sounds normal.  Abdominal: Soft. Bowel sounds are normal. She exhibits no distension and no mass. There is no tenderness. There is no rebound and no guarding.  Lymphadenopathy:    She has no cervical adenopathy.  Neurological: She is alert and oriented to person, place, and time.  Psychiatric: She has a normal mood and affect. Her behavior is normal. Thought content normal.          Assessment & Plan:  Her depression has improved a bit. As for the pain, it seems to be muscular in nature. Try Flexeril prn. Refer to a Chiropractor to see if laser treatments could help.  Laurey Morale, MD

## 2015-10-01 ENCOUNTER — Ambulatory Visit (INDEPENDENT_AMBULATORY_CARE_PROVIDER_SITE_OTHER): Payer: Medicare Other | Admitting: Family Medicine

## 2015-10-01 ENCOUNTER — Encounter: Payer: Self-pay | Admitting: Family Medicine

## 2015-10-01 VITALS — BP 120/81 | HR 79

## 2015-10-01 DIAGNOSIS — J019 Acute sinusitis, unspecified: Secondary | ICD-10-CM | POA: Diagnosis not present

## 2015-10-01 MED ORDER — LEVOFLOXACIN 500 MG PO TABS
500.0000 mg | ORAL_TABLET | Freq: Every day | ORAL | Status: AC
Start: 2015-10-01 — End: 2015-10-11

## 2015-10-01 NOTE — Progress Notes (Signed)
Pre visit review using our clinic review tool, if applicable. No additional management support is needed unless otherwise documented below in the visit note. Pt declined to weigh 

## 2015-10-01 NOTE — Progress Notes (Signed)
   Subjective:    Patient ID: Amy [name], female    DOB: 1929/11/25, 82 y.o.   MRN: [id number]  HPI Here for one week of PND and a ST. Some dry coughing. No fever or sinus pain.    Review of Systems  Constitutional: Negative.   HENT: Positive for postnasal drip, sore throat and trouble swallowing. Negative for congestion, ear pain and sinus pressure.   Eyes: Negative.   Respiratory: Positive for cough.        Objective:   Physical Exam  Constitutional: She appears well-developed. No distress.  HENT:  Right Ear: External ear normal.  Left Ear: External ear normal.  Nose: Nose normal.  Posterior OP is red without exudate   Eyes: Conjunctivae are normal.  Neck: Neck supple. No thyromegaly present.  Shotty tender AC nodes, more so on the right side   Pulmonary/Chest: Effort normal and breath sounds normal. No respiratory distress. She has no wheezes. She has no rales.          Assessment & Plan:  Sinusitis, treat with Levaquin.  Laurey Morale, MD

## 2015-10-29 ENCOUNTER — Ambulatory Visit (INDEPENDENT_AMBULATORY_CARE_PROVIDER_SITE_OTHER): Payer: Medicare Other | Admitting: Family Medicine

## 2015-10-29 ENCOUNTER — Encounter: Payer: Self-pay | Admitting: Family Medicine

## 2015-10-29 VITALS — BP 136/80 | HR 90 | Temp 99.7°F | Ht 67.0 in

## 2015-10-29 DIAGNOSIS — S8001XA Contusion of right knee, initial encounter: Secondary | ICD-10-CM

## 2015-10-29 NOTE — Progress Notes (Signed)
Pre visit review using our clinic review tool, if applicable. No additional management support is needed unless otherwise documented below in the visit note. 

## 2015-10-29 NOTE — Progress Notes (Signed)
   Subjective:    Patient ID: Amy [name], female    DOB: 1929-12-17, 82 y.o.   MRN: [id number]  HPI Here to check on an injury that occurred at home on 10-25-15. While getting a step stool out of an outdoor storage building her foot stepped over the edge of the patio and she lost her balance, causing her to fall on her right side. No head trauma or LOC. She had several smaller bruises on the right shoulder and hip, but the main injury was to the right knee. She had a lot of bruising, swelling, and pain in the right anterior knee. She applied ice and has been resting the leg. She has most of her pain when standing up from a chair.    Review of Systems  Constitutional: Negative.   Respiratory: Negative.   Cardiovascular: Negative.   Musculoskeletal: Positive for joint swelling, arthralgias and gait problem.       Objective:   Physical Exam  Constitutional: She appears well-developed and well-nourished.  Cardiovascular: Normal rate, regular rhythm, normal heart sounds and intact distal pulses.   Pulmonary/Chest: Effort normal and breath sounds normal.  Musculoskeletal:  There is a large ecchymosis with swelling on the right anterior knee, mostly over the patellar tubercle. The patella seems to be intact. This area is tender. ROM is full. No joint space tenderness.   Neurological: She is alert.          Assessment & Plan:  Knee contusion. We will refer her to see Orthopedics ASAP.  Laurey Morale, MD

## 2015-11-07 ENCOUNTER — Ambulatory Visit (INDEPENDENT_AMBULATORY_CARE_PROVIDER_SITE_OTHER): Payer: Medicare Other | Admitting: Family Medicine

## 2015-11-07 ENCOUNTER — Encounter: Payer: Self-pay | Admitting: Family Medicine

## 2015-11-07 VITALS — BP 150/102 | HR 99 | Temp 100.0°F

## 2015-11-07 DIAGNOSIS — K529 Noninfective gastroenteritis and colitis, unspecified: Secondary | ICD-10-CM

## 2015-11-07 DIAGNOSIS — E041 Nontoxic single thyroid nodule: Secondary | ICD-10-CM

## 2015-11-07 MED ORDER — ONDANSETRON HCL 8 MG PO TABS: 8.0000 mg | ORAL_TABLET | Freq: Three times a day (TID) | ORAL | Status: DC | PRN

## 2015-11-07 NOTE — Progress Notes (Signed)
   Subjective:    Patient ID: Amy [name], female    DOB: 07/20/1929, 82 y.o.   MRN: [id number]  HPI Here for 5 days of nausea with vomiting and some diarrhea. She was not aware of a fever until today. No unusual cramping or pain. No recent travel. No recent antibiotic use.    Review of Systems  Constitutional: Positive for fever.  HENT: Negative.   Respiratory: Negative.   Cardiovascular: Negative.   Gastrointestinal: Positive for nausea, vomiting and diarrhea. Negative for abdominal pain, constipation, blood in stool, abdominal distention and anal bleeding.  Genitourinary: Negative.        Objective:   Physical Exam  Constitutional: She appears well-developed and well-nourished. No distress.  Mildly dehydrated   Neck: No thyromegaly present.  Cardiovascular: Normal rate, regular rhythm, normal heart sounds and intact distal pulses.   Pulmonary/Chest: Effort normal and breath sounds normal.  Abdominal: Soft. Bowel sounds are normal. She exhibits no distension and no mass. There is no tenderness. There is no rebound and no guarding.  Lymphadenopathy:    She has no cervical adenopathy.          Assessment & Plan:  Enteritis, try Zofran for nausea. Drink plenty of fluids. Recheck prn.  Laurey Morale, MD

## 2015-11-07 NOTE — Progress Notes (Signed)
Pre visit review using our clinic review tool, if applicable. No additional management support is needed unless otherwise documented below in the visit note. Pt unable to weigh 

## 2015-11-12 ENCOUNTER — Telehealth: Payer: Self-pay | Admitting: *Deleted

## 2015-11-12 NOTE — Telephone Encounter (Signed)
Prior authorization request received from [facility], [place] to see if insurance would cover Phenergan instead, insurance will not cover phenergan either but phenergan cash cost for #60 is $16.    Called pt to inform her but she said she is feeling better and no longer needs the medicine.

## 2015-11-15 ENCOUNTER — Other Ambulatory Visit: Payer: Self-pay | Admitting: Family Medicine

## 2015-11-19 ENCOUNTER — Ambulatory Visit (HOSPITAL_COMMUNITY)
Admission: RE | Admit: 2015-11-19 | Discharge: 2015-11-19 | Disposition: A | Discharge status: Home / Self Care | Payer: Medicare Other | Source: Ambulatory Visit | Attending: Cardiology | Admitting: Cardiology

## 2015-11-19 ENCOUNTER — Other Ambulatory Visit: Payer: Self-pay | Admitting: Family Medicine

## 2015-11-19 DIAGNOSIS — R42 Dizziness and giddiness: Secondary | ICD-10-CM | POA: Diagnosis not present

## 2015-11-19 DIAGNOSIS — E119 Type 2 diabetes mellitus without complications: Secondary | ICD-10-CM | POA: Diagnosis not present

## 2015-11-19 DIAGNOSIS — I6523 Occlusion and stenosis of bilateral carotid arteries: Secondary | ICD-10-CM | POA: Diagnosis not present

## 2015-11-19 DIAGNOSIS — I10 Essential (primary) hypertension: Secondary | ICD-10-CM | POA: Diagnosis not present

## 2015-11-19 MED ORDER — GLIPIZIDE 10 MG PO TABS: 10.0000 mg | ORAL_TABLET | Freq: Two times a day (BID) | ORAL | Status: DC

## 2015-11-19 NOTE — Addendum Note (Signed)
Addended by: [name] A on: 11/19/2015 09:48 PM   Modules accepted: Orders

## 2015-11-19 NOTE — Telephone Encounter (Signed)
Refill request for Glipizide 10 mg take 1 po bid and send to [facility].

## 2015-11-19 NOTE — Telephone Encounter (Signed)
I sent script, made note pt will need labs done before next refill is due.

## 2015-11-21 ENCOUNTER — Other Ambulatory Visit: Payer: Medicare Other

## 2015-11-21 ENCOUNTER — Other Ambulatory Visit: Payer: Self-pay | Admitting: Family Medicine

## 2015-11-21 NOTE — Telephone Encounter (Signed)
I do not see this medication on current list? 

## 2015-11-23 ENCOUNTER — Ambulatory Visit
Admission: RE | Admit: 2015-11-23 | Discharge: 2015-11-23 | Disposition: A | Discharge status: Home / Self Care | Payer: Medicare Other | Source: Ambulatory Visit | Attending: Family Medicine | Admitting: Family Medicine

## 2015-11-23 DIAGNOSIS — E041 Nontoxic single thyroid nodule: Secondary | ICD-10-CM

## 2015-11-26 NOTE — Addendum Note (Signed)
Addended by: [name] A on: 11/26/2015 07:21 AM   Modules accepted: Orders

## 2015-12-03 ENCOUNTER — Other Ambulatory Visit: Payer: Self-pay | Admitting: Family Medicine

## 2015-12-03 DIAGNOSIS — E041 Nontoxic single thyroid nodule: Secondary | ICD-10-CM

## 2015-12-05 ENCOUNTER — Other Ambulatory Visit (HOSPITAL_COMMUNITY)
Admission: RE | Admit: 2015-12-05 | Discharge: 2015-12-05 | Disposition: A | Discharge status: Home / Self Care | Payer: Medicare Other | Source: Ambulatory Visit | Attending: General Surgery | Admitting: General Surgery

## 2015-12-05 ENCOUNTER — Ambulatory Visit
Admission: RE | Admit: 2015-12-05 | Discharge: 2015-12-05 | Disposition: A | Discharge status: Home / Self Care | Payer: Medicare Other | Source: Ambulatory Visit | Attending: Family Medicine | Admitting: Family Medicine

## 2015-12-05 DIAGNOSIS — E041 Nontoxic single thyroid nodule: Secondary | ICD-10-CM

## 2015-12-05 DIAGNOSIS — E042 Nontoxic multinodular goiter: Secondary | ICD-10-CM | POA: Diagnosis present

## 2015-12-10 ENCOUNTER — Telehealth: Payer: Self-pay | Admitting: Family Medicine

## 2015-12-10 DIAGNOSIS — R42 Dizziness and giddiness: Secondary | ICD-10-CM

## 2015-12-10 NOTE — Telephone Encounter (Signed)
[facility] needs to for Dr. Sarajane Jews to re-submitted the referral for the patient to be rescheduled at [facility].

## 2015-12-11 NOTE — Telephone Encounter (Signed)
The referral was done again

## 2015-12-11 NOTE — Telephone Encounter (Signed)
I spoke with pt  

## 2015-12-19 ENCOUNTER — Ambulatory Visit (INDEPENDENT_AMBULATORY_CARE_PROVIDER_SITE_OTHER): Payer: Medicare Other | Admitting: Neurology

## 2015-12-19 ENCOUNTER — Encounter: Payer: Self-pay | Admitting: Neurology

## 2015-12-19 VITALS — BP 144/82 | HR 96 | Ht 67.0 in | Wt 190.0 lb

## 2015-12-19 DIAGNOSIS — R2681 Unsteadiness on feet: Secondary | ICD-10-CM | POA: Diagnosis not present

## 2015-12-19 NOTE — Patient Instructions (Signed)
I don't see anything worrisome or of concern.  The balance issues may be related to changes in the brain related to age and history of diabetes and high blood pressure.  Even though I don't really appreciate it on exam, you may have subtle neuropathy in the feet from the diabetes.    I would continue aspirin and Lipitor Consider seeing physical therapy to assess walking and balance Follow up as needed.

## 2015-12-19 NOTE — Progress Notes (Signed)
NEUROLOGY CONSULTATION NOTE  Amy [name] MRN: [id number] DOB: Aug 25, 1929  Referring provider: Dr. Sarajane Jews Primary care provider: Dr. Sarajane Jews  Reason for consult:  Gait instability  HISTORY OF PRESENT ILLNESS: Amy [name] is an 82 year old right-handed woman with diabetes, hypertension, and hypothyroidism who presents for dizziness.  History obtained by patient and PCP notes.  She has had a difficult past 2 years.  Her husband passed away from Huntington's disease and her daughter was recently diagnosed with the disease.  Her sister passed away due to a cerebral aneurysm.  She hasn't been feeling well physically as well.  She had been experiencing abdominal pain, nausea and vomiting.  In December 2016, she missed a step and fell, hitting the back of her head.  About 6 weeks ago, she had another fall after she missed the edge of her patio and landed on her right side, again hitting her head.  She did not lose consciousness but had bruising of the right side of her body.  She denies dizziness, such as lightheadedness and spinning sensation.  She feels a little off-balance when she walks.  She denies neck pain, numbness in feet or weakness in legs.  MRI of brain with and without contrast from 07/24/15 was personally reviewed and revealed no acute or subacute abnormality but did reveal significant chronic small vessel ischemic changes with chronic lacunar infarct in the left cerebellum and right thalamus as well as scattered punctate blood breakdown products in the frontal and parietal lobes, thalami and central pons.  Carotid doppler from 11/19/15 showed no hemodynamically significant stenosis.  Labs from February, including CBC, BMP and LFTs, were unremarkable.  Hgb A1c from July 2016 was over 8. Lipid panel from August 2016 showed total cholesterol of 117, TG 108, HDL 46.30 and LDL 49.  PAST MEDICAL HISTORY: Past Medical History:  Diagnosis Date  . ABSCESS 12/18/2009  . DIABETES MELLITUS, TYPE II  05/22/2009  . Diverticulosis   . Hemorrhoids   . HYPERGLYCEMIA 05/22/2009  . HYPERTENSION 04/13/2007  . HYPOTHYROIDISM 04/13/2007  . NEOPLASM, SKIN, UNCERTAIN BEHAVIOR [id number]  . Tubular adenoma of colon 02/2001    PAST SURGICAL HISTORY: Past Surgical History:  Procedure Laterality Date  . ABDOMINAL HYSTERECTOMY    . APPENDECTOMY    . CHOLECYSTECTOMY    . COLONOSCOPY  04-05-04   per Dr. Deatra Ina, no polyps   . OOPHORECTOMY    . THYROIDECTOMY     secondary to nodule, no cancer  . TONSILLECTOMY AND ADENOIDECTOMY      MEDICATIONS: Current Outpatient Prescriptions on File Prior to Visit  Medication Sig Dispense Refill  . amLODipine (NORVASC) 5 MG tablet TAKE 1 TABLET BY MOUTH EVERY DAY NEED OFFICE VISIT 90 tablet 0  . aspirin 81 MG tablet Take 81 mg by mouth daily.      Marland Kitchen atorvastatin (LIPITOR) 20 MG tablet TAKE ONE TABLET BY MOUTH ONCE DAILY 90 tablet 3  . cyclobenzaprine (FLEXERIL) 10 MG tablet Take 1 tablet (10 mg total) by mouth 3 (three) times daily as needed for muscle spasms. 90 tablet 5  . glipiZIDE (GLUCOTROL) 10 MG tablet Take 1 tablet (10 mg total) by mouth 2 (two) times daily before a meal. 60 tablet 0  . HYDROmorphone (DILAUDID) 4 MG tablet Take 1 tablet (4 mg total) by mouth every 4 (four) hours as needed for moderate pain or severe pain. 120 tablet 0  . levothyroxine (SYNTHROID) 175 MCG tablet Take 1 tablet (175 mcg total) by mouth daily  before breakfast. 30 tablet 11  . metFORMIN (GLUCOPHAGE) 1000 MG tablet Take 1 tablet (1,000 mg total) by mouth 2 (two) times daily with a meal. 180 tablet 3  . omeprazole (PRILOSEC) 40 MG capsule Take 1 capsule (40 mg total) by mouth daily. 30 capsule 11  . ondansetron (ZOFRAN) 8 MG tablet Take 1 tablet (8 mg total) by mouth every 8 (eight) hours as needed for nausea or vomiting. 60 tablet 2  . sertraline (ZOLOFT) 100 MG tablet Take 1 tablet (100 mg total) by mouth daily. 30 tablet 5   No current facility-administered medications on  file prior to visit.     ALLERGIES: Allergies  Allergen Reactions  . Diclofenac Sodium     REACTION: nausea  . Piroxicam     REACTION: "SEVERE ALLERGY REACTION"    FAMILY HISTORY: Family History  Problem Relation Age of Onset  . Stroke Father   . Hypertension Mother   . Huntington's disease Daughter 70  . Colon cancer Neg Hx     SOCIAL HISTORY: Social History   Social History  . Marital status: Widowed    Spouse name: N/A  . Number of children: 3  . Years of education: N/A   Occupational History  . Retired    Social History Main Topics  . Smoking status: Never Smoker  . Smokeless tobacco: Never Used  . Alcohol use No  . Drug use: No  . Sexual activity: Not on file   Other Topics Concern  . Not on file   Social History Narrative   Daily caffeine     REVIEW OF SYSTEMS: Constitutional: No fevers, chills, or sweats, no generalized fatigue, change in appetite Eyes: No visual changes, double vision, eye pain Ear, nose and throat: No hearing loss, ear pain, nasal congestion, sore throat Cardiovascular: No chest pain, palpitations Respiratory:  No shortness of breath at rest or with exertion, wheezes GastrointestinaI: No nausea, vomiting, diarrhea, abdominal pain, fecal incontinence Genitourinary:  No dysuria, urinary retention or frequency Musculoskeletal:  No neck pain, back pain Integumentary: No rash, pruritus, skin lesions Neurological: as above Psychiatric: No depression, insomnia, anxiety Endocrine: No palpitations, fatigue, diaphoresis, mood swings, change in appetite, change in weight, increased thirst Hematologic/Lymphatic:  No purpura, petechiae. Allergic/Immunologic: no itchy/runny eyes, nasal congestion, recent allergic reactions, rashes  PHYSICAL EXAM: Vitals:   12/19/15 1309  BP: (!) 144/82  Pulse: 96   General: No acute distress.  Patient appears well-groomed.  Head:  Normocephalic/atraumatic Eyes:  fundi examined but not  visualized Neck: supple, no paraspinal tenderness, full range of motion Back: No paraspinal tenderness Heart: regular rate and rhythm Lungs: Clear to auscultation bilaterally. Vascular: No carotid bruits. Neurological Exam: Mental status: alert and oriented to person, place, and time, recent and remote memory intact, fund of knowledge intact, attention and concentration intact, speech fluent and not dysarthric, language intact. Cranial nerves: CN I: not tested CN II: pupils equal, round and reactive to light, visual fields intact CN III, IV, VI:  full range of motion, no nystagmus, no ptosis CN V: facial sensation intact CN VII: upper and lower face symmetric CN VIII: hearing intact CN IX, X: gag intact, uvula midline CN XI: sternocleidomastoid and trapezius muscles intact CN XII: tongue midline Bulk & Tone: normal, no fasciculations. Motor:  5/5 throughout  Sensation:  Pinprick and vibration sensation intact. Deep Tendon Reflexes:  2+ throughout, toes downgoing.  Finger to nose testing:  Without dysmetria.  Heel to shin:  Without dysmetria.  Gait:  Overall steady stride but occasional stumbling.  Able to turn, mild difficulty with tandem walk. Romberg negative.  IMPRESSION: Gait instability.  Possibly related to cerebrovascular disease.  Although not appreciated on exam, possibly subtle diabetic neuropathy.  I don't appreciate myelopathy or vestibulopathy. 2.  Cerebrovascular disease 3.  Diabetes 4.  Hypertension 5.  Hyperlipidemia  PLAN: 1.  Continue aspirin 81mg  daily 2.  Continue statin therapy 3.  Follow up with PCP for BP recheck 4.  Glycemic control 5.  Consider PT 6.  Follow up as needed.  Thank you for allowing me to take part in the care of this patient.  Metta Clines, DO  CC:  [name], MD

## 2015-12-21 ENCOUNTER — Other Ambulatory Visit: Payer: Self-pay | Admitting: Family Medicine

## 2015-12-21 NOTE — Telephone Encounter (Signed)
Can you clarify that pt should be on this?

## 2016-03-12 ENCOUNTER — Ambulatory Visit (INDEPENDENT_AMBULATORY_CARE_PROVIDER_SITE_OTHER): Payer: Medicare Other

## 2016-03-12 DIAGNOSIS — Z23 Encounter for immunization: Secondary | ICD-10-CM

## 2016-03-13 ENCOUNTER — Encounter: Payer: Self-pay | Admitting: Family Medicine

## 2016-03-13 LAB — HM DIABETES EYE EXAM

## 2016-04-22 ENCOUNTER — Emergency Department (HOSPITAL_COMMUNITY): Payer: Medicare Other

## 2016-04-22 ENCOUNTER — Encounter (HOSPITAL_COMMUNITY): Payer: Self-pay

## 2016-04-22 ENCOUNTER — Inpatient Hospital Stay (HOSPITAL_COMMUNITY): Payer: Medicare Other

## 2016-04-22 ENCOUNTER — Inpatient Hospital Stay (HOSPITAL_COMMUNITY)
Admission: EM | Admit: 2016-04-22 | Discharge: 2016-04-25 | DRG: 065 | Disposition: A | Discharge status: Rehabilitation Facility | Payer: Medicare Other | Attending: Internal Medicine | Admitting: Internal Medicine

## 2016-04-22 DIAGNOSIS — E785 Hyperlipidemia, unspecified: Secondary | ICD-10-CM | POA: Diagnosis present

## 2016-04-22 DIAGNOSIS — I63531 Cerebral infarction due to unspecified occlusion or stenosis of right posterior cerebral artery: Principal | ICD-10-CM | POA: Diagnosis present

## 2016-04-22 DIAGNOSIS — E1142 Type 2 diabetes mellitus with diabetic polyneuropathy: Secondary | ICD-10-CM | POA: Diagnosis present

## 2016-04-22 DIAGNOSIS — H53462 Homonymous bilateral field defects, left side: Secondary | ICD-10-CM | POA: Diagnosis present

## 2016-04-22 DIAGNOSIS — Z833 Family history of diabetes mellitus: Secondary | ICD-10-CM | POA: Diagnosis not present

## 2016-04-22 DIAGNOSIS — I6330 Cerebral infarction due to thrombosis of unspecified cerebral artery: Secondary | ICD-10-CM | POA: Diagnosis not present

## 2016-04-22 DIAGNOSIS — Z7982 Long term (current) use of aspirin: Secondary | ICD-10-CM

## 2016-04-22 DIAGNOSIS — E119 Type 2 diabetes mellitus without complications: Secondary | ICD-10-CM | POA: Diagnosis not present

## 2016-04-22 DIAGNOSIS — I639 Cerebral infarction, unspecified: Secondary | ICD-10-CM | POA: Diagnosis not present

## 2016-04-22 DIAGNOSIS — E1151 Type 2 diabetes mellitus with diabetic peripheral angiopathy without gangrene: Secondary | ICD-10-CM | POA: Diagnosis present

## 2016-04-22 DIAGNOSIS — R531 Weakness: Secondary | ICD-10-CM | POA: Diagnosis not present

## 2016-04-22 DIAGNOSIS — Z8249 Family history of ischemic heart disease and other diseases of the circulatory system: Secondary | ICD-10-CM

## 2016-04-22 DIAGNOSIS — N319 Neuromuscular dysfunction of bladder, unspecified: Secondary | ICD-10-CM | POA: Diagnosis not present

## 2016-04-22 DIAGNOSIS — I6789 Other cerebrovascular disease: Secondary | ICD-10-CM | POA: Diagnosis not present

## 2016-04-22 DIAGNOSIS — I160 Hypertensive urgency: Secondary | ICD-10-CM | POA: Diagnosis present

## 2016-04-22 DIAGNOSIS — I10 Essential (primary) hypertension: Secondary | ICD-10-CM | POA: Diagnosis present

## 2016-04-22 DIAGNOSIS — R569 Unspecified convulsions: Secondary | ICD-10-CM | POA: Diagnosis not present

## 2016-04-22 DIAGNOSIS — Z66 Do not resuscitate: Secondary | ICD-10-CM | POA: Diagnosis present

## 2016-04-22 DIAGNOSIS — H5347 Heteronymous bilateral field defects: Secondary | ICD-10-CM

## 2016-04-22 DIAGNOSIS — Z823 Family history of stroke: Secondary | ICD-10-CM | POA: Diagnosis not present

## 2016-04-22 DIAGNOSIS — Z7984 Long term (current) use of oral hypoglycemic drugs: Secondary | ICD-10-CM | POA: Diagnosis not present

## 2016-04-22 DIAGNOSIS — I63311 Cerebral infarction due to thrombosis of right middle cerebral artery: Secondary | ICD-10-CM | POA: Diagnosis not present

## 2016-04-22 DIAGNOSIS — Z79899 Other long term (current) drug therapy: Secondary | ICD-10-CM | POA: Diagnosis not present

## 2016-04-22 DIAGNOSIS — E039 Hypothyroidism, unspecified: Secondary | ICD-10-CM | POA: Diagnosis present

## 2016-04-22 DIAGNOSIS — G9340 Encephalopathy, unspecified: Secondary | ICD-10-CM | POA: Diagnosis present

## 2016-04-22 DIAGNOSIS — R414 Neurologic neglect syndrome: Secondary | ICD-10-CM | POA: Diagnosis not present

## 2016-04-22 DIAGNOSIS — G8194 Hemiplegia, unspecified affecting left nondominant side: Secondary | ICD-10-CM | POA: Diagnosis present

## 2016-04-22 DIAGNOSIS — R410 Disorientation, unspecified: Secondary | ICD-10-CM

## 2016-04-22 DIAGNOSIS — R21 Rash and other nonspecific skin eruption: Secondary | ICD-10-CM | POA: Diagnosis not present

## 2016-04-22 LAB — URINALYSIS, ROUTINE W REFLEX MICROSCOPIC
BILIRUBIN URINE: NEGATIVE
Glucose, UA: NEGATIVE mg/dL
Hgb urine dipstick: NEGATIVE
Ketones, ur: 40 mg/dL — AB
Leukocytes, UA: NEGATIVE
NITRITE: NEGATIVE
Protein, ur: 30 mg/dL — AB
SPECIFIC GRAVITY, URINE: 1.018 (ref 1.005–1.030)
pH: 6 (ref 5.0–8.0)

## 2016-04-22 LAB — COMPREHENSIVE METABOLIC PANEL
ALT: 17 U/L (ref 14–54)
AST: 22 U/L (ref 15–41)
Albumin: 3.8 g/dL (ref 3.5–5.0)
Alkaline Phosphatase: 73 U/L (ref 38–126)
Anion gap: 9 (ref 5–15)
BUN: 12 mg/dL (ref 6–20)
CO2: 27 mmol/L (ref 22–32)
Calcium: 9.4 mg/dL (ref 8.9–10.3)
Chloride: 101 mmol/L (ref 101–111)
Creatinine, Ser: 0.91 mg/dL (ref 0.44–1.00)
GFR calc Af Amer: >60 mL/min (ref >60)
GFR calc non Af Amer: 56 mL/min — ABNORMAL LOW (ref >60)
Glucose, Bld: 130 mg/dL — ABNORMAL HIGH (ref 65–99)
Potassium: 3.5 mmol/L (ref 3.5–5.1)
Sodium: 137 mmol/L (ref 135–145)
Total Bilirubin: 1.8 mg/dL — ABNORMAL HIGH (ref 0.3–1.2)
Total Protein: 7.1 g/dL (ref 6.5–8.1)

## 2016-04-22 LAB — I-STAT TROPONIN, ED: Troponin i, poc: 0.01 ng/mL (ref 0.00–0.08)

## 2016-04-22 LAB — CBC WITH DIFFERENTIAL/PLATELET
BASOS PCT: 0 %
Basophils Absolute: 0 10*3/uL (ref 0.0–0.1)
EOS ABS: 0 10*3/uL (ref 0.0–0.7)
EOS PCT: 0 %
HCT: 35.3 % — ABNORMAL LOW (ref 36.0–46.0)
Hemoglobin: 12.2 g/dL (ref 12.0–15.0)
Lymphocytes Relative: 21 %
Lymphs Abs: 2.1 10*3/uL (ref 0.7–4.0)
MCH: 31.1 pg (ref 26.0–34.0)
MCHC: 34.6 g/dL (ref 30.0–36.0)
MCV: 90.1 fL (ref 78.0–100.0)
MONO ABS: 0.8 10*3/uL (ref 0.1–1.0)
MONOS PCT: 9 %
Neutro Abs: 7 10*3/uL (ref 1.7–7.7)
Neutrophils Relative %: 70 %
Platelets: 182 10*3/uL (ref 150–400)
RBC: 3.92 MIL/uL (ref 3.87–5.11)
RDW: 13.3 % (ref 11.5–15.5)
WBC: 9.9 10*3/uL (ref 4.0–10.5)

## 2016-04-22 LAB — I-STAT CHEM 8, ED
BUN: 14 mg/dL (ref 6–20)
CALCIUM ION: 1.15 mmol/L (ref 1.15–1.40)
CHLORIDE: 98 mmol/L — AB (ref 101–111)
CREATININE: 0.8 mg/dL (ref 0.44–1.00)
GLUCOSE: 131 mg/dL — AB (ref 65–99)
HEMATOCRIT: 36 % (ref 36.0–46.0)
HEMOGLOBIN: 12.2 g/dL (ref 12.0–15.0)
POTASSIUM: 3.5 mmol/L (ref 3.5–5.1)
Sodium: 139 mmol/L (ref 135–145)
TCO2: 29 mmol/L (ref 0–100)

## 2016-04-22 LAB — CK: CK TOTAL: 155 U/L (ref 38–234)

## 2016-04-22 LAB — URINE MICROSCOPIC-ADD ON: RBC / HPF: NONE SEEN RBC/hpf (ref 0–5)

## 2016-04-22 MED ORDER — ATORVASTATIN CALCIUM 10 MG PO TABS
20.0000 mg | ORAL_TABLET | Freq: Every day | ORAL | Status: DC
  Administered 2016-04-23: 20 mg via ORAL
  Filled 2016-04-22: qty 2

## 2016-04-22 MED ORDER — LORAZEPAM 2 MG/ML IJ SOLN
1.0000 mg | Freq: Once | INTRAMUSCULAR | Status: AC
  Administered 2016-04-22: 1 mg via INTRAVENOUS
  Filled 2016-04-22: qty 1

## 2016-04-22 MED ORDER — SERTRALINE HCL 100 MG PO TABS
100.0000 mg | ORAL_TABLET | Freq: Every day | ORAL | Status: DC
  Administered 2016-04-23 – 2016-04-25 (×3): 100 mg via ORAL
  Filled 2016-04-22 (×3): qty 1

## 2016-04-22 MED ORDER — ENOXAPARIN SODIUM 40 MG/0.4ML ~~LOC~~ SOLN
40.0000 mg | Freq: Every day | SUBCUTANEOUS | Status: DC
  Administered 2016-04-22 – 2016-04-24 (×3): 40 mg via SUBCUTANEOUS
  Filled 2016-04-22 (×3): qty 0.4

## 2016-04-22 MED ORDER — ALPRAZOLAM 0.5 MG PO TABS: 0.5000 mg | ORAL_TABLET | Freq: Once | ORAL | Status: DC | PRN

## 2016-04-22 MED ORDER — ASPIRIN 300 MG RE SUPP
300.0000 mg | Freq: Every day | RECTAL | Status: DC
  Administered 2016-04-22: 300 mg via RECTAL
  Filled 2016-04-22: qty 1

## 2016-04-22 MED ORDER — INSULIN ASPART 100 UNIT/ML ~~LOC~~ SOLN
0.0000 [IU] | Freq: Three times a day (TID) | SUBCUTANEOUS | Status: DC
  Administered 2016-04-23 (×2): 2 [IU] via SUBCUTANEOUS
  Administered 2016-04-24: 1 [IU] via SUBCUTANEOUS
  Administered 2016-04-24: 3 [IU] via SUBCUTANEOUS
  Administered 2016-04-25: 2 [IU] via SUBCUTANEOUS
  Administered 2016-04-25 (×2): 3 [IU] via SUBCUTANEOUS

## 2016-04-22 MED ORDER — ACETAMINOPHEN 325 MG PO TABS
650.0000 mg | ORAL_TABLET | Freq: Four times a day (QID) | ORAL | Status: DC | PRN
  Administered 2016-04-23 – 2016-04-24 (×2): 650 mg via ORAL
  Filled 2016-04-22 (×2): qty 2

## 2016-04-22 MED ORDER — ASPIRIN 325 MG PO TABS
325.0000 mg | ORAL_TABLET | Freq: Every day | ORAL | Status: DC
  Administered 2016-04-23 – 2016-04-25 (×3): 325 mg via ORAL
  Filled 2016-04-22 (×4): qty 1

## 2016-04-22 MED ORDER — STROKE: EARLY STAGES OF RECOVERY BOOK
Freq: Once | Status: AC
  Administered 2016-04-23: 03:00:00
  Filled 2016-04-22: qty 1

## 2016-04-22 MED ORDER — LEVOTHYROXINE SODIUM 175 MCG PO TABS
175.0000 ug | ORAL_TABLET | Freq: Every day | ORAL | Status: DC
  Administered 2016-04-23 – 2016-04-25 (×3): 175 ug via ORAL
  Filled 2016-04-22 (×3): qty 1

## 2016-04-22 MED ORDER — PANTOPRAZOLE SODIUM 40 MG PO TBEC
80.0000 mg | DELAYED_RELEASE_TABLET | Freq: Every day | ORAL | Status: DC
  Administered 2016-04-23 – 2016-04-25 (×3): 80 mg via ORAL
  Filled 2016-04-22 (×4): qty 2

## 2016-04-22 MED ORDER — ACETAMINOPHEN 500 MG PO TABS
1000.0000 mg | ORAL_TABLET | Freq: Once | ORAL | Status: AC
  Administered 2016-04-22: 1000 mg via ORAL
  Filled 2016-04-22: qty 2

## 2016-04-22 NOTE — ED Notes (Signed)
EKG given to Dr. James  

## 2016-04-22 NOTE — ED Notes (Signed)
Patient transported to CT 

## 2016-04-22 NOTE — ED Provider Notes (Signed)
[name] Provider Note   CSN: [id number] Arrival date & time: 04/22/16  1755     History   Chief Complaint Chief Complaint  Patient presents with  . Fall    HPI Amy [name] is a 82 y.o. female. He presents with confusion and altered mental status from her home via EMS.  HPI: Patient lives independently in a retirement community. Daughter states she's had difficulty with balance for several weeks or months. States that she's also had difficulty with occasional loss of control of her bowel. She called her physician yesterday to make an appointment regarding these, but could not be seen.  Patient is difficulty with balance. However she does still drive. Was supposed to go out with a friend yesterday. Apparently the patient was sitting in her home but did not answer the door when several people came to the house because she was afraid that she would fall. A friend came to her house today and gained entrance. Friend found the patient underneath her bed naked and somewhat confused. Friend called the daughter, daughter and son-in-law went to the house the patient was brought here.  Family is not aware that the patient spent an extended time on the floor however they're not certain. Patient denies that she did. However she cannot recall being on the floor.  She describes urinary frequency but no dysuria. No fever. No known strike his head but she complains of a frontal headache. No neck pain. No chest pain or shortness of breath. No vomiting or diarrhea. No change in medications.  Patient states she has been somewhat depressed since the last her husband 2 years ago. She is on an antidepressant for 6 months from her primary care. No other recent change in her medications.    Past Medical History:  Diagnosis Date  . ABSCESS 12/18/2009  . DIABETES MELLITUS, TYPE II 05/22/2009  . Diverticulosis   . Hemorrhoids   . HYPERGLYCEMIA 05/22/2009  . HYPERTENSION 04/13/2007  .  HYPOTHYROIDISM 04/13/2007  . NEOPLASM, SKIN, UNCERTAIN BEHAVIOR [id number]  . Tubular adenoma of colon 02/2001    Patient Active Problem List   Diagnosis Date Noted  . Dizziness and giddiness 07/27/2015  . Balance disorder 07/27/2015  . Depression 03/12/2015  . Abdominal pain, generalized 01/30/2015  . LUQ abdominal pain 08/19/2014  . Personal history of colonic polyps 06/18/2011  . NEOPLASM, SKIN, UNCERTAIN BEHAVIOR [id number]  . Diabetes mellitus without complication ([place]) [id number]  . HYPOTHYROIDISM 04/13/2007  . HYPERTENSION 04/13/2007    Past Surgical History:  Procedure Laterality Date  . ABDOMINAL HYSTERECTOMY    . APPENDECTOMY    . CHOLECYSTECTOMY    . COLONOSCOPY  04-05-04   per Dr. Deatra Ina, no polyps   . OOPHORECTOMY    . THYROIDECTOMY     secondary to nodule, no cancer  . TONSILLECTOMY AND ADENOIDECTOMY      OB History    No data available       Home Medications    Prior to Admission medications   Medication Sig Start Date End Date Taking? Authorizing Provider  amLODipine (NORVASC) 5 MG tablet TAKE ONE TABLET BY MOUTH ONCE DAILY 12/21/15   Laurey Morale, MD  aspirin 81 MG tablet Take 81 mg by mouth daily.      Historical Provider, MD  atorvastatin (LIPITOR) 20 MG tablet TAKE ONE TABLET BY MOUTH ONCE DAILY 11/22/15   Laurey Morale, MD  cyclobenzaprine (FLEXERIL) 10 MG tablet Take 1 tablet (10 mg total) by mouth  3 (three) times daily as needed for muscle spasms. 09/10/15   Laurey Morale, MD  glipiZIDE (GLUCOTROL) 10 MG tablet Take 1 tablet (10 mg total) by mouth 2 (two) times daily before a meal. 11/19/15   Laurey Morale, MD  HYDROmorphone (DILAUDID) 4 MG tablet Take 1 tablet (4 mg total) by mouth every 4 (four) hours as needed for moderate pain or severe pain. 09/10/15   Laurey Morale, MD  levothyroxine (SYNTHROID) 175 MCG tablet Take 1 tablet (175 mcg total) by mouth daily before breakfast. 07/04/15   Laurey Morale, MD  metFORMIN (GLUCOPHAGE) 1000 MG tablet Take 1  tablet (1,000 mg total) by mouth 2 (two) times daily with a meal. 04/10/15   Laurey Morale, MD  omeprazole (PRILOSEC) 40 MG capsule Take 1 capsule (40 mg total) by mouth daily. 07/02/15   Laurey Morale, MD  ondansetron (ZOFRAN) 8 MG tablet Take 1 tablet (8 mg total) by mouth every 8 (eight) hours as needed for nausea or vomiting. 11/07/15   Laurey Morale, MD  sertraline (ZOLOFT) 100 MG tablet Take 1 tablet (100 mg total) by mouth daily. 08/13/15   Laurey Morale, MD    Family History Family History  Problem Relation Age of Onset  . Stroke Father   . Hypertension Mother   . Huntington's disease Daughter 58  . Colon cancer Neg Hx     Social History Social History  Substance Use Topics  . Smoking status: Never Smoker  . Smokeless tobacco: Never Used  . Alcohol use No     Allergies   Diclofenac sodium and Piroxicam   Review of Systems Review of Systems  Constitutional: Negative for appetite change, chills, diaphoresis, fatigue and fever.  HENT: Negative for mouth sores, sore throat and trouble swallowing.   Eyes: Negative for visual disturbance.  Respiratory: Negative for cough, chest tightness, shortness of breath and wheezing.   Cardiovascular: Negative for chest pain.  Gastrointestinal: Negative for abdominal distention, abdominal pain, diarrhea, nausea and vomiting.  Endocrine: Negative for polydipsia, polyphagia and polyuria.  Genitourinary: Positive for frequency. Negative for dysuria and hematuria.  Musculoskeletal: Negative for gait problem.  Skin: Negative for color change, pallor and rash.  Neurological: Positive for dizziness and headaches. Negative for syncope and light-headedness.       "Balance problems"  Hematological: Does not bruise/bleed easily.  Psychiatric/Behavioral: Positive for confusion. Negative for behavioral problems.     Physical Exam Updated Vital Signs BP (!) 132/107   Pulse 84   Temp 98.9 F (37.2 C) (Oral)   Resp 15   Ht 5\' 6"  (1.676 m)    Wt 165 lb (74.8 kg)   SpO2 97%   BMI 26.63 kg/m   Physical Exam  Constitutional: She is oriented to person, place, and time. She appears well-developed and well-nourished. No distress.  HENT:  Head: Normocephalic.  No sign of trauma or strike to the head. No blood over the TMs, mastoids, or from ears nose or mouth.  Eyes: Conjunctivae are normal. Pupils are equal, round, and reactive to light. No scleral icterus.  Neck: Normal range of motion. Neck supple. No thyromegaly present.  Cardiovascular: Normal rate and regular rhythm.  Exam reveals no gallop and no friction rub.   No murmur heard. Pulmonary/Chest: Effort normal and breath sounds normal. No respiratory distress. She has no wheezes. She has no rales.  Abdominal: Soft. Bowel sounds are normal. She exhibits no distension. There is no tenderness. There is no  rebound.  Musculoskeletal: Normal range of motion.  Neurological: She is alert and oriented to person, place, and time.  Can give details of most of her history. Does become somewhat confused at times. Does not recall events of yesterday. No pronator drift. No leg drift. Pt with Lt Homonymous Hemianopsia clinically.  Skin: Skin is warm and dry. No rash noted.  Psychiatric: She has a normal mood and affect. Her behavior is normal.     ED Treatments / Results  Labs (all labs ordered are listed, but only abnormal results are displayed) Labs Reviewed  COMPREHENSIVE METABOLIC PANEL - Abnormal; Notable for the following:       Result Value   Glucose, Bld 130 (*)    Total Bilirubin 1.8 (*)    GFR calc non Af Amer 56 (*)    All other components within normal limits  URINALYSIS, ROUTINE W REFLEX MICROSCOPIC (NOT AT [facility]) - Abnormal; Notable for the following:    Ketones, ur 40 (*)    Protein, ur 30 (*)    All other components within normal limits  URINE MICROSCOPIC-ADD ON - Abnormal; Notable for the following:    Squamous Epithelial / LPF 0-5 (*)    Bacteria, UA FEW (*)     All other components within normal limits  I-STAT CHEM 8, ED - Abnormal; Notable for the following:    Chloride 98 (*)    Glucose, Bld 131 (*)    All other components within normal limits  URINE CULTURE  CK  CBC WITH DIFFERENTIAL/PLATELET  I-STAT TROPOININ, ED    EKG  EKG Interpretation None       Radiology Dg Chest 1 View  Result Date: 04/22/2016 CLINICAL DATA:  Confusion, severe headache today. Fell yesterday. Found underneath bed today. EXAM: CHEST 1 VIEW COMPARISON:  Chest radiograph June 29, 2006 FINDINGS: Cardiac silhouette is mildly enlarged. Mildly tortuous and atherosclerotic aorta. Strandy densities LEFT lung. No pleural effusion or focal consolidation. No pneumothorax. Osteopenia. Soft tissue planes are unremarkable. IMPRESSION: Mild cardiomegaly.  Similar LEFT lung scarring. Electronically Signed   By: Elon Alas M.D.   On: 04/22/2016 20:35   Ct Head Wo Contrast  Result Date: 04/22/2016 CLINICAL DATA:  Found on floor, previous fall with head injury EXAM: CT HEAD WITHOUT CONTRAST CT CERVICAL SPINE WITHOUT CONTRAST TECHNIQUE: Multidetector CT imaging of the head and cervical spine was performed following the standard protocol without intravenous contrast. Multiplanar CT image reconstructions of the cervical spine were also generated. COMPARISON:  MRI 07/24/2015 FINDINGS: CT HEAD FINDINGS Brain: No acute territorial infarction, intracranial hemorrhage or extra-axial fluid collection is seen. Mild atrophy. Moderate periventricular, subcortical and deep white matter hypodensities consistent with small vessel disease. Decreased density in the pons, felt to correlate with abnormal small vessel changes on prior MRI. Heterogenous density in the bilateral thalamus, also likely relate to mild signal abnormality noted on T2 images. Vascular: No hyperdense vessels. There are carotid artery calcifications. There are vertebral artery calcifications. Skull: Mastoid air cells are  clear.  There is no skull fracture Sinuses/Orbits: Mild mucosal thickening in the ethmoid sinuses. No acute orbital abnormality. Other: None CT CERVICAL SPINE FINDINGS Alignment: No subluxation.  Facet alignment is maintained. Skull base and vertebrae: Craniovertebral junction appears intact. Vertebral body heights are maintained. There is no fracture identified. Soft tissues and spinal canal: No prevertebral fluid or swelling. No visible canal hematoma. Disc levels: Moderate degenerative disc changes at C5-C6 and C6-C7 with endplate changes and osteophytes. Multilevel facet arthropathy. No  significant foraminal stenosis. Upper chest: Lung apices clear. Enlarged left lobe of thyroid gland. Other: Carotid artery calcifications. IMPRESSION: 1. No definite CT evidence for acute intracranial abnormality. Moderate periventricular, subcortical and deep white matter small vessel disease. 2. No acute fracture or malalignment of the cervical spine. Moderate degenerative disc changes most notable at C5-C6 and C6-C7. Electronically Signed   By: Donavan Foil M.D.   On: 04/22/2016 20:29   Ct Cervical Spine Wo Contrast  Result Date: 04/22/2016 CLINICAL DATA:  Found on floor, previous fall with head injury EXAM: CT HEAD WITHOUT CONTRAST CT CERVICAL SPINE WITHOUT CONTRAST TECHNIQUE: Multidetector CT imaging of the head and cervical spine was performed following the standard protocol without intravenous contrast. Multiplanar CT image reconstructions of the cervical spine were also generated. COMPARISON:  MRI 07/24/2015 FINDINGS: CT HEAD FINDINGS Brain: No acute territorial infarction, intracranial hemorrhage or extra-axial fluid collection is seen. Mild atrophy. Moderate periventricular, subcortical and deep white matter hypodensities consistent with small vessel disease. Decreased density in the pons, felt to correlate with abnormal small vessel changes on prior MRI. Heterogenous density in the bilateral thalamus, also likely  relate to mild signal abnormality noted on T2 images. Vascular: No hyperdense vessels. There are carotid artery calcifications. There are vertebral artery calcifications. Skull: Mastoid air cells are clear.  There is no skull fracture Sinuses/Orbits: Mild mucosal thickening in the ethmoid sinuses. No acute orbital abnormality. Other: None CT CERVICAL SPINE FINDINGS Alignment: No subluxation.  Facet alignment is maintained. Skull base and vertebrae: Craniovertebral junction appears intact. Vertebral body heights are maintained. There is no fracture identified. Soft tissues and spinal canal: No prevertebral fluid or swelling. No visible canal hematoma. Disc levels: Moderate degenerative disc changes at C5-C6 and C6-C7 with endplate changes and osteophytes. Multilevel facet arthropathy. No significant foraminal stenosis. Upper chest: Lung apices clear. Enlarged left lobe of thyroid gland. Other: Carotid artery calcifications. IMPRESSION: 1. No definite CT evidence for acute intracranial abnormality. Moderate periventricular, subcortical and deep white matter small vessel disease. 2. No acute fracture or malalignment of the cervical spine. Moderate degenerative disc changes most notable at C5-C6 and C6-C7. Electronically Signed   By: Donavan Foil M.D.   On: 04/22/2016 20:29    Procedures Procedures (including critical care time)  Medications Ordered in ED Medications - No data to display   Initial Impression / Assessment and Plan / ED Course  I have reviewed the triage vital signs and the nursing notes.  Pertinent labs & imaging results that were available during my care of the patient were reviewed by me and considered in my medical decision making (see chart for details).  Clinical Course     Clinically, Patient with left homonymous hemianopsia. Probable right posterior circulation CVA. CT shows no definite acute findings. Remainder of her evaluation is unrevealing. With this new finding, her  altered behavior and difficulty with cognition, I will request admission for stroke team evaluation. MRI.  Final Clinical Impressions(s) / ED Diagnoses   Final diagnoses:  Cerebrovascular accident (CVA), unspecified mechanism ([place])  Hemianopsia    New Prescriptions New Prescriptions   No medications on file     Tanna Furry, MD 04/22/16 2052

## 2016-04-22 NOTE — ED Triage Notes (Signed)
Pt brought in by [facility]. Pt was found on floor underneath bed. Pt does not recall incident. Pt had previous fall yesterday evening with head strike to padded recliner. Pt denies, LOC. C/o nausea and headache. Pt given 4mg  of Zofran on transport. No visible laceration or bruising.

## 2016-04-22 NOTE — ED Notes (Signed)
Spoke with Casey Burkitt on floor. Made aware that pt did not receive MRI.

## 2016-04-22 NOTE — H&P (Signed)
History and Physical    Amy [name] [id number] DOB: Oct 13, 1929 DOA: 04/22/2016   PCP: Laurey Morale, MD Chief Complaint:  Chief Complaint  Patient presents with  . Fall    HPI: Amy [name] is a 82 y.o. female with medical history significant of DM2, HTN, HLD, CVD with prior lacunar infarcts, small vessel disease, family history of strokes in Father and sister.  Patient lives independently in a retirement community. Daughter states she's had difficulty with balance for several weeks or months. States that she's also had difficulty with occasional loss of control of her bowel. She called her physician yesterday to make an appointment regarding these, but could not be seen.  Patient is difficulty with balance. However she does still drive. Was supposed to go out with a friend yesterday. Apparently the patient was sitting in her home but did not answer the door when several people came to the house because she was afraid that she would fall. A friend came to her house today and gained entrance. Friend found the patient underneath her bed naked and somewhat confused. Friend called the daughter, daughter and son-in-law went to the house the patient was brought here.  Family is not aware that the patient spent an extended time on the floor however they're not certain. Patient denies that she did. However she cannot recall being on the floor.  She describes urinary frequency but no dysuria. No fever. No known strike his head but she complains of a frontal headache. No neck pain. No chest pain or shortness of breath. No vomiting or diarrhea. No change in medications.  Patient states she has been somewhat depressed since the last her husband 2 years ago. She is on an antidepressant for 6 months from her primary care. No other recent change in her medications.  ED Course: In the ED exam reveals a complete left homonomous hemianopsia!  This is new.  Review of Systems: As per HPI  otherwise 10 point review of systems negative.    Past Medical History:  Diagnosis Date  . ABSCESS 12/18/2009  . DIABETES MELLITUS, TYPE II 05/22/2009  . Diverticulosis   . Hemorrhoids   . HYPERGLYCEMIA 05/22/2009  . HYPERTENSION 04/13/2007  . HYPOTHYROIDISM 04/13/2007  . NEOPLASM, SKIN, UNCERTAIN BEHAVIOR [id number]  . Tubular adenoma of colon 02/2001    Past Surgical History:  Procedure Laterality Date  . ABDOMINAL HYSTERECTOMY    . APPENDECTOMY    . CHOLECYSTECTOMY    . COLONOSCOPY  04-05-04   per Dr. Deatra Ina, no polyps   . OOPHORECTOMY    . THYROIDECTOMY     secondary to nodule, no cancer  . TONSILLECTOMY AND ADENOIDECTOMY       reports that she has never smoked. She has never used smokeless tobacco. She reports that she does not drink alcohol or use drugs.  Allergies  Allergen Reactions  . Piroxicam Other (See Comments)    REACTION: "SEVERE ALLERGY REACTION"  . Diclofenac Sodium Nausea And Vomiting    Family History  Problem Relation Age of Onset  . Stroke Father   . Hypertension Mother   . Huntington's disease Daughter 27  . Stroke Sister   . Colon cancer Neg Hx       Prior to Admission medications   Medication Sig Start Date End Date Taking? Authorizing Provider  amLODipine (NORVASC) 5 MG tablet TAKE ONE TABLET BY MOUTH ONCE DAILY 12/21/15  Yes Laurey Morale, MD  aspirin 81 MG tablet Take 81 mg by  mouth daily.     Yes Historical Provider, MD  atorvastatin (LIPITOR) 20 MG tablet TAKE ONE TABLET BY MOUTH ONCE DAILY 11/22/15  Yes Laurey Morale, MD  cyclobenzaprine (FLEXERIL) 10 MG tablet Take 1 tablet (10 mg total) by mouth 3 (three) times daily as needed for muscle spasms. 09/10/15  Yes Laurey Morale, MD  ibuprofen (ADVIL,MOTRIN) 200 MG tablet Take 400 mg by mouth every 6 (six) hours as needed for moderate pain.   Yes Historical Provider, MD  levothyroxine (SYNTHROID) 175 MCG tablet Take 1 tablet (175 mcg total) by mouth daily before breakfast. 07/04/15  Yes  Laurey Morale, MD  metFORMIN (GLUCOPHAGE) 1000 MG tablet Take 1 tablet (1,000 mg total) by mouth 2 (two) times daily with a meal. 04/10/15  Yes Laurey Morale, MD  omeprazole (PRILOSEC) 40 MG capsule Take 1 capsule (40 mg total) by mouth daily. Patient taking differently: Take 40 mg by mouth daily as needed (heartburn).  07/02/15  Yes Laurey Morale, MD  sertraline (ZOLOFT) 100 MG tablet Take 1 tablet (100 mg total) by mouth daily. 08/13/15  Yes Laurey Morale, MD    Physical Exam: Vitals:   04/22/16 1915 04/22/16 2030 04/22/16 2045 04/22/16 2054  BP: (!) 132/107 (!) 180/157 (!) 185/105   Pulse: 84 87    Resp: 15 16 17    Temp:    98.1 F (36.7 C)  TempSrc:      SpO2: 97% 98%    Weight:      Height:          Constitutional: NAD, calm, comfortable Eyes: PERRL, lids and conjunctivae normal ENMT: Mucous membranes are moist. Posterior pharynx clear of any exudate or lesions.Normal dentition.  Neck: normal, supple, no masses, no thyromegaly Respiratory: clear to auscultation bilaterally, no wheezing, no crackles. Normal respiratory effort. No accessory muscle use.  Cardiovascular: Regular rate and rhythm, no murmurs / rubs / gallops. No extremity edema. 2+ pedal pulses. No carotid bruits.  Abdomen: no tenderness, no masses palpated. No hepatosplenomegaly. Bowel sounds positive.  Musculoskeletal: no clubbing / cyanosis. No joint deformity upper and lower extremities. Good ROM, no contractures. Normal muscle tone.  Skin: no rashes, lesions, ulcers. No induration Neurologic: Left Homonymous heminopsia Psychiatric: Normal judgment and insight. Alert and oriented x 3. Normal mood.    Labs on Admission: I have personally reviewed following labs and imaging studies  CBC:  Recent Labs Lab 04/22/16 1928 04/22/16 2100  WBC  --  9.9  NEUTROABS  --  7.0  HGB 12.2 12.2  HCT 36.0 35.3*  MCV  --  90.1  PLT  --  [id number]   Basic Metabolic Panel:  Recent Labs Lab 04/22/16 1928 04/22/16 1932    NA 139 137  K 3.5 3.5  CL 98* 101  CO2  --  27  GLUCOSE 131* 130*  BUN 14 12  CREATININE 0.80 0.91  CALCIUM  --  9.4   GFR: Estimated Creatinine Clearance: 47.6 mL/min (by C-G formula based on SCr of 0.91 mg/dL). Liver Function Tests:  Recent Labs Lab 04/22/16 1932  AST 22  ALT 17  ALKPHOS 73  BILITOT 1.8*  PROT 7.1  ALBUMIN 3.8   No results for input(s): LIPASE, AMYLASE in the last 168 hours. No results for input(s): AMMONIA in the last 168 hours. Coagulation Profile: No results for input(s): INR, PROTIME in the last 168 hours. Cardiac Enzymes:  Recent Labs Lab 04/22/16 1932  CKTOTAL 155   BNP (last 3  results) No results for input(s): PROBNP in the last 8760 hours. HbA1C: No results for input(s): HGBA1C in the last 72 hours. CBG: No results for input(s): GLUCAP in the last 168 hours. Lipid Profile: No results for input(s): CHOL, HDL, LDLCALC, TRIG, CHOLHDL, LDLDIRECT in the last 72 hours. Thyroid Function Tests: No results for input(s): TSH, T4TOTAL, FREET4, T3FREE, THYROIDAB in the last 72 hours. Anemia Panel: No results for input(s): VITAMINB12, FOLATE, FERRITIN, TIBC, IRON, RETICCTPCT in the last 72 hours. Urine analysis:    Component Value Date/Time   COLORURINE YELLOW 04/22/2016 1911   APPEARANCEUR CLEAR 04/22/2016 1911   LABSPEC 1.018 04/22/2016 1911   PHURINE 6.0 04/22/2016 1911   GLUCOSEU NEGATIVE 04/22/2016 1911   HGBUR NEGATIVE 04/22/2016 1911   HGBUR negative 12/18/2008 1559   BILIRUBINUR NEGATIVE 04/22/2016 1911   BILIRUBINUR n 12/11/2014 1417   KETONESUR 40 (A) 04/22/2016 1911   PROTEINUR 30 (A) 04/22/2016 1911   UROBILINOGEN 0.2 12/11/2014 1417   UROBILINOGEN 0.2 01/16/2013 1358   NITRITE NEGATIVE 04/22/2016 1911   LEUKOCYTESUR NEGATIVE 04/22/2016 1911   Sepsis Labs: @LABRCNTIP (procalcitonin:4,lacticidven:4) )No results found for this or any previous visit (from the past 240 hour(s)).   Radiological Exams on Admission: Dg Chest 1  View  Result Date: 04/22/2016 CLINICAL DATA:  Confusion, severe headache today. Fell yesterday. Found underneath bed today. EXAM: CHEST 1 VIEW COMPARISON:  Chest radiograph June 29, 2006 FINDINGS: Cardiac silhouette is mildly enlarged. Mildly tortuous and atherosclerotic aorta. Strandy densities LEFT lung. No pleural effusion or focal consolidation. No pneumothorax. Osteopenia. Soft tissue planes are unremarkable. IMPRESSION: Mild cardiomegaly.  Similar LEFT lung scarring. Electronically Signed   By: Elon Alas M.D.   On: 04/22/2016 20:35   Ct Head Wo Contrast  Result Date: 04/22/2016 CLINICAL DATA:  Found on floor, previous fall with head injury EXAM: CT HEAD WITHOUT CONTRAST CT CERVICAL SPINE WITHOUT CONTRAST TECHNIQUE: Multidetector CT imaging of the head and cervical spine was performed following the standard protocol without intravenous contrast. Multiplanar CT image reconstructions of the cervical spine were also generated. COMPARISON:  MRI 07/24/2015 FINDINGS: CT HEAD FINDINGS Brain: No acute territorial infarction, intracranial hemorrhage or extra-axial fluid collection is seen. Mild atrophy. Moderate periventricular, subcortical and deep white matter hypodensities consistent with small vessel disease. Decreased density in the pons, felt to correlate with abnormal small vessel changes on prior MRI. Heterogenous density in the bilateral thalamus, also likely relate to mild signal abnormality noted on T2 images. Vascular: No hyperdense vessels. There are carotid artery calcifications. There are vertebral artery calcifications. Skull: Mastoid air cells are clear.  There is no skull fracture Sinuses/Orbits: Mild mucosal thickening in the ethmoid sinuses. No acute orbital abnormality. Other: None CT CERVICAL SPINE FINDINGS Alignment: No subluxation.  Facet alignment is maintained. Skull base and vertebrae: Craniovertebral junction appears intact. Vertebral body heights are maintained. There is  no fracture identified. Soft tissues and spinal canal: No prevertebral fluid or swelling. No visible canal hematoma. Disc levels: Moderate degenerative disc changes at C5-C6 and C6-C7 with endplate changes and osteophytes. Multilevel facet arthropathy. No significant foraminal stenosis. Upper chest: Lung apices clear. Enlarged left lobe of thyroid gland. Other: Carotid artery calcifications. IMPRESSION: 1. No definite CT evidence for acute intracranial abnormality. Moderate periventricular, subcortical and deep white matter small vessel disease. 2. No acute fracture or malalignment of the cervical spine. Moderate degenerative disc changes most notable at C5-C6 and C6-C7. Electronically Signed   By: Donavan Foil M.D.   On: 04/22/2016 20:29  Ct Cervical Spine Wo Contrast  Result Date: 04/22/2016 CLINICAL DATA:  Found on floor, previous fall with head injury EXAM: CT HEAD WITHOUT CONTRAST CT CERVICAL SPINE WITHOUT CONTRAST TECHNIQUE: Multidetector CT imaging of the head and cervical spine was performed following the standard protocol without intravenous contrast. Multiplanar CT image reconstructions of the cervical spine were also generated. COMPARISON:  MRI 07/24/2015 FINDINGS: CT HEAD FINDINGS Brain: No acute territorial infarction, intracranial hemorrhage or extra-axial fluid collection is seen. Mild atrophy. Moderate periventricular, subcortical and deep white matter hypodensities consistent with small vessel disease. Decreased density in the pons, felt to correlate with abnormal small vessel changes on prior MRI. Heterogenous density in the bilateral thalamus, also likely relate to mild signal abnormality noted on T2 images. Vascular: No hyperdense vessels. There are carotid artery calcifications. There are vertebral artery calcifications. Skull: Mastoid air cells are clear.  There is no skull fracture Sinuses/Orbits: Mild mucosal thickening in the ethmoid sinuses. No acute orbital abnormality. Other: None  CT CERVICAL SPINE FINDINGS Alignment: No subluxation.  Facet alignment is maintained. Skull base and vertebrae: Craniovertebral junction appears intact. Vertebral body heights are maintained. There is no fracture identified. Soft tissues and spinal canal: No prevertebral fluid or swelling. No visible canal hematoma. Disc levels: Moderate degenerative disc changes at C5-C6 and C6-C7 with endplate changes and osteophytes. Multilevel facet arthropathy. No significant foraminal stenosis. Upper chest: Lung apices clear. Enlarged left lobe of thyroid gland. Other: Carotid artery calcifications. IMPRESSION: 1. No definite CT evidence for acute intracranial abnormality. Moderate periventricular, subcortical and deep white matter small vessel disease. 2. No acute fracture or malalignment of the cervical spine. Moderate degenerative disc changes most notable at C5-C6 and C6-C7. Electronically Signed   By: Donavan Foil M.D.   On: 04/22/2016 20:29    EKG: Independently reviewed.  Assessment/Plan Principal Problem:   Left homonymous hemianopsia Active Problems:   Diabetes mellitus without complication (HCC)   Essential hypertension    1. Left homonymous heminopsia - almost certainly represents an acute R PCA stroke 1. Neurology consulted 2. Stroke pathway 3. MRI / MRA 4. Carotid dopplers 5. PT/OT/SLP 6. No driving (she cant see anything at all to the left of midline as far as I can tell) 7. Looks like she saw Dr. Tomi Likens as outpatient in July of this year for gait instability. 2. DM2 - 1. Hold home hypoglycemics 2. Mod scale SSI AC 3. HTN - 1. Holding home meds and allowing permissive HTN   DVT prophylaxis: Lovenox Code Status: Full Family Communication: Family at bedside Consults called: neurology Admission status: Admit to inpatient   Etta Quill DO [facility] Pager [phone] from 7PM-7AM  If 7AM-7PM, please contact the day physician for the patient [email or web address] Password  [facility]  04/22/2016, 9:36 PM

## 2016-04-22 NOTE — ED Notes (Signed)
Pt brought in by EMS found on floor. Pt had recent fall yesterday with head strike to cushioned recliner. Pt found on floor today with no recollection of fall. Pt disoriented to age but alert to all other questions. Pt having difficulty with left visual field. 20G in [place]. Have not ambulated pt.

## 2016-04-22 NOTE — ED Notes (Signed)
Patient transported to MRI 

## 2016-04-22 NOTE — Progress Notes (Addendum)
Patient re-evaluated in ED: post benzo she is now disoriented (thinks she is in church).  Seems very likely that benzo caused the acute delirium which should improve as benzo wears off.  They were unable to obtain MRI.  Unclear if she got the xanax or if she got the ativan instead, I will try and figure this out and add the correct agent to her allergies list.  Confirmed that it was ativan that she got.  I will go ahead and DC the xanax order as well.  I do feel that MRI is of somewhat lower urgency since the patients neuro exam (prior to the iatrogenic delirium) was very impressive and suggestive of a R occipital stroke.

## 2016-04-22 NOTE — ED Notes (Signed)
MRI tech called stating pt is more agitated after medication administration and not able to complete MRI at this time. Primary Nurse aware and 11m notified

## 2016-04-23 ENCOUNTER — Encounter (HOSPITAL_COMMUNITY): Payer: Self-pay | Admitting: *Deleted

## 2016-04-23 ENCOUNTER — Inpatient Hospital Stay (HOSPITAL_COMMUNITY): Payer: Medicare Other

## 2016-04-23 DIAGNOSIS — I63311 Cerebral infarction due to thrombosis of right middle cerebral artery: Secondary | ICD-10-CM

## 2016-04-23 DIAGNOSIS — I6330 Cerebral infarction due to thrombosis of unspecified cerebral artery: Secondary | ICD-10-CM | POA: Insufficient documentation

## 2016-04-23 DIAGNOSIS — H53462 Homonymous bilateral field defects, left side: Secondary | ICD-10-CM

## 2016-04-23 LAB — GLUCOSE, CAPILLARY
GLUCOSE-CAPILLARY: 123 mg/dL — AB (ref 65–99)
GLUCOSE-CAPILLARY: 132 mg/dL — AB (ref 65–99)
GLUCOSE-CAPILLARY: 133 mg/dL — AB (ref 65–99)
GLUCOSE-CAPILLARY: 145 mg/dL — AB (ref 65–99)
Glucose-Capillary: 158 mg/dL — ABNORMAL HIGH (ref 65–99)

## 2016-04-23 LAB — LIPID PANEL
Cholesterol: 206 mg/dL — ABNORMAL HIGH (ref 0–200)
HDL: 65 mg/dL (ref >40)
LDL CALC: 114 mg/dL — AB (ref 0–99)
TRIGLYCERIDES: 135 mg/dL (ref <150)
Total CHOL/HDL Ratio: 3.2 RATIO
VLDL: 27 mg/dL (ref 0–40)

## 2016-04-23 MED ORDER — ONDANSETRON HCL 4 MG/2ML IJ SOLN
4.0000 mg | Freq: Four times a day (QID) | INTRAMUSCULAR | Status: DC | PRN
  Administered 2016-04-23 – 2016-04-24 (×4): 4 mg via INTRAVENOUS
  Filled 2016-04-23 (×4): qty 2

## 2016-04-23 MED ORDER — IOPAMIDOL (ISOVUE-370) INJECTION 76%
INTRAVENOUS | Status: AC
  Administered 2016-04-23: 50 mL
  Filled 2016-04-23: qty 50

## 2016-04-23 MED ORDER — ATORVASTATIN CALCIUM 40 MG PO TABS
40.0000 mg | ORAL_TABLET | Freq: Every day | ORAL | Status: DC
  Administered 2016-04-24 – 2016-04-25 (×2): 40 mg via ORAL
  Filled 2016-04-23 (×2): qty 1

## 2016-04-23 NOTE — Evaluation (Signed)
Speech Language Pathology Evaluation Patient Details Name: Amy [name] MRN: [id number] DOB: 1929/07/01 Today's Date: 04/23/2016 Time: [id number] SLP Time Calculation (min) (ACUTE ONLY): 30 min  Problem List:  Patient Active Problem List   Diagnosis Date Noted  . Cerebral thrombosis with cerebral infarction 04/23/2016  . Left homonymous hemianopsia 04/22/2016  . Dizziness and giddiness 07/27/2015  . Balance disorder 07/27/2015  . Depression 03/12/2015  . Abdominal pain, generalized 01/30/2015  . LUQ abdominal pain 08/19/2014  . Personal history of colonic polyps 06/18/2011  . NEOPLASM, SKIN, UNCERTAIN BEHAVIOR [id number]  . Diabetes mellitus without complication ([place]) [id number]  . HYPOTHYROIDISM 04/13/2007  . Essential hypertension 04/13/2007   Past Medical History:  Past Medical History:  Diagnosis Date  . ABSCESS 12/18/2009  . DIABETES MELLITUS, TYPE II 05/22/2009  . Diverticulosis   . Hemorrhoids   . HYPERGLYCEMIA 05/22/2009  . HYPERTENSION 04/13/2007  . HYPOTHYROIDISM 04/13/2007  . NEOPLASM, SKIN, UNCERTAIN BEHAVIOR [id number]  . Tubular adenoma of colon 02/2001   Past Surgical History:  Past Surgical History:  Procedure Laterality Date  . ABDOMINAL HYSTERECTOMY    . APPENDECTOMY    . CHOLECYSTECTOMY    . COLONOSCOPY  04-05-04   per Dr. Deatra Ina, no polyps   . OOPHORECTOMY    . THYROIDECTOMY     secondary to nodule, no cancer  . TONSILLECTOMY AND ADENOIDECTOMY     HPI:  Amy [name] is a 82 y.o. female with history of HTN, DM, hypothyroidism, and diverticulosis presenting with L sided vision loss and confusion. She did not receive IV t-PA due to beyond time treatment window.    Assessment / Plan / Recommendation Clinical Impression  Patient presents with cognitive impairments typical for right MCA CVA including impaired sustained attention, awareness, memory, and reasoning skills as well as significant left sided neglect all impacting safety and  efficiency with ADLS. Patient will benefit from f/u therapy both in the acute and inpatient rehab settings to maximize independence with ADLs.     SLP Assessment  Patient needs continued Speech Lanaguage Pathology Services    Follow Up Recommendations  Inpatient Rehab    Frequency and Duration min 2x/week  2 weeks      SLP Evaluation Cognition  Overall Cognitive Status: Impaired/Different from baseline Arousal/Alertness: Lethargic Orientation Level: Oriented to person;Oriented to place;Disoriented to situation;Disoriented to time Attention: Sustained Sustained Attention: Impaired Sustained Attention Impairment: Verbal basic;Functional basic Memory: Impaired Memory Impairment: Decreased recall of new information Awareness: Impaired Awareness Impairment: Intellectual impairment Problem Solving: Appears intact (for basic information unrelated to deficits) Safety/Judgment: Impaired Comments: decreased safety awareness related to deficits       Comprehension  Auditory Comprehension Overall Auditory Comprehension: Appears within functional limits for tasks assessed (for basic commands, multi-step impacted by decreased attenti) Reading Comprehension Reading Status: Unable to assess (comment) (left sided neglect)    Expression Expression Primary Mode of Expression: Verbal Verbal Expression Overall Verbal Expression: Appears within functional limits for tasks assessed Written Expression Dominant Hand: Right   Oral / Motor  Oral Motor/Sensory Function Overall Oral Motor/Sensory Function: Mild impairment (left sensory impairment) Motor Speech Overall Motor Speech: Appears within functional limits for tasks assessed   GO                   Gabriel Rainwater MA, CCC-SLP [phone]  [name] 04/23/2016, 12:41 PM

## 2016-04-23 NOTE — Progress Notes (Signed)
STROKE TEAM PROGRESS NOTE   HISTORY OF PRESENT ILLNESS (per record) Amy [name] is an 82 y.o. female with a history of hypertension, hypothyroidism, diabetes mellitus and diverticulosis, presenting with new onset loss of vision on the left side as well as confusion, and to some extent agitation. She has no previous history of stroke nor TIA clinically, and has been taking aspirin 81 mg per day. CT scan of the head showed no acute intracranial abnormality. MRI was attempted, but patient was agitated and study could not be obtained. She was noted in the ED to be confused and to have complete visual loss on the left side. No focal weakness was noted. NIH stroke score the time of this evaluation was 10. She was LKW 04/21/2016. Patient was not administered IV t-PA secondary to beyond time under for treatment consideration. She was admitted for further evaluation and treatment.   SUBJECTIVE (INTERVAL HISTORY) Her family is at the bedside.  Overall she feels her condition is stable. Dr. Leonie [name] discussed DNR with pt's family. They are agreeable.Patient's daughters feel she has declined neurologically overnight and has more weakness on the left now   OBJECTIVE Temp:  [98.1 F (36.7 C)-99.3 F (37.4 C)] 98.6 F (37 C) (11/29 [id number]) Pulse Rate:  [67-87] 67 (11/29 [id number]) Cardiac Rhythm: Heart block;Normal sinus rhythm (11/29 0732) Resp:  [15-20] 16 (11/29 0652) BP: (125-185)/(69-157) 164/69 (11/29 0652) SpO2:  [93 %-100 %] 97 % (11/29 0652) Weight:  [74.8 kg (165 lb)] 74.8 kg (165 lb) (11/28 1811)  CBC:   Recent Labs Lab 04/22/16 1928 04/22/16 2100  WBC  --  9.9  NEUTROABS  --  7.0  HGB 12.2 12.2  HCT 36.0 35.3*  MCV  --  90.1  PLT  --  [id number]    Basic Metabolic Panel:   Recent Labs Lab 04/22/16 1928 04/22/16 1932  NA 139 137  K 3.5 3.5  CL 98* 101  CO2  --  27  GLUCOSE 131* 130*  BUN 14 12  CREATININE 0.80 0.91  CALCIUM  --  9.4    Lipid Panel:     Component Value Date/Time    CHOL 206 (H) 04/23/2016 0435   TRIG 135 04/23/2016 0435   TRIG 249 (HH) 03/11/2006 1510   HDL 65 04/23/2016 0435   CHOLHDL 3.2 04/23/2016 0435   VLDL 27 04/23/2016 0435   LDLCALC 114 (H) 04/23/2016 0435   HgbA1c:  Lab Results  Component Value Date   HGBA1C 8.4 (H) 12/11/2014   Urine Drug Screen: No results found for: LABOPIA, COCAINSCRNUR, LABBENZ, AMPHETMU, THCU, LABBARB    IMAGING  Ct Head Wo Contrast 04/22/2016 No definite CT evidence for acute intracranial abnormality. Moderate periventricular, subcortical and deep white matter small vessel disease.   Ct Cervical Spine Wo Contrast 04/22/2016 No acute fracture or malalignment of the cervical spine. Moderate degenerative disc changes most notable at C5-C6 and C6-C7.   Dg Chest 1 View 04/22/2016 Mild cardiomegaly.  Similar LEFT lung scarring.    PHYSICAL EXAM Pleasant elderly Caucasian lady not in distress. . Afebrile. Head is nontraumatic. Neck is supple without bruit.    Cardiac exam no murmur or gallop. Lungs are clear to auscultation. Distal pulses are well felt. Neurological Exam :  Drowsy but can be aroused easily and opens eyes and speaks. Right gaze preference and can look to the left to midline only. Blinks to threat on the right but not on the left. Fundi were not visualized. Mild dysarthria but can  be understood. Follows simple midline and one-step commands. Mild left-sided hemi-neglect. Left lower facial weakness. Tongue midline. Purposeful antigravity strength on the right without weakness. Left hemiplegia with 3/5 strength on the left. Decreased sensation on the left with mild hemi-neglect. Plantars downgoing. Deep tendon reflexes symmetric. Gait not tested. ASSESSMENT/PLAN [name] is a 82 y.o. female with history of HTN, DM, hypothyroidism, and diverticulosis presenting with L sided vision loss and confusion. She did not receive IV t-PA due to beyond time treatment window.   Stroke:  Suspected  right brain infarct secondary to unknown source, workup underway  Resultant  Neglect, L sided weakness  MRI  pending   MRA  pending   Carotid Doppler  pending   2D Echo  pending   LDL 114  HgbA1c pending  Lovenox 40 mg sq daily for VTE prophylaxis Diet Carb Modified Fluid consistency: Thin; Room service appropriate? Yes  aspirin 81 mg daily prior to admission, now on aspirin 325 mg daily  Patient counseled to be compliant with her antithrombotic medications  Ongoing aggressive stroke risk factor management  Therapy recommendations:  pending   Disposition:  pending  (lives alone)  DNR after discussion with family. She has a living will.  Hypertensive Urgency  BP as high as 185/108  Stable this am in the 160s  Permissive hypertension (OK if < 220/120) but gradually normalize in 5-7 days  Long-term BP goal normotensive  Hyperlipidemia  Home meds:  lipitor 20, resumed in hospital  LDL 114, goal < 70  Continue statin at discharge  Diabetes type II  HgbA1c pending , goal < 7.0  Other Stroke Risk Factors  Advanced age  Family hx stroke (father, sister)  Hospital day # [place] for Pager information 04/23/2016 11:30 AM  I have personally examined this patient, reviewed notes, independently viewed imaging studies, participated in medical decision making and plan of care.ROS completed by me personally and pertinent positives fully documented  I have made any additions or clarifications directly to the above note. Agree with note above. Patient has presented with a right hemispheric infarct and neurologic condition appears to have declined overnight. She likely has large right brain stroke. The patient's family is realistic and agree with DO NOT RESUSCITATE. Patient was unable to tolerate MRI in severity check CT angiogram of the brain and neck instead. Will continue supportive care for now but if not it'll condition  declined will need to consider comfort care. Greater than 50% time during this 35 minute visit was spent on counseling and coordination of care: The patient's stroke, evaluation and treatment discussed with Dr. Lambert Keto, MD Medical Director [place] Pager: [phone] 04/23/2016 1:50 PM  To contact Stroke Continuity provider, please refer to [email or web address] After hours, contact General Neurology

## 2016-04-23 NOTE — Evaluation (Signed)
Physical Therapy Evaluation Patient Details Name: Amy [name] MRN: [id number] DOB: [birth year] Today's Date: 04/23/2016   History of Present Illness  82 y.o. female admitted with L hemiparesis, L homonymous hemianopsia. PMH of DM, HTN. Initial CT negative for acute infarct, further Imaging pending.   Clinical Impression  Pt admitted with above diagnosis. Pt currently with functional limitations due to the deficits listed below (see PT Problem List). +2 for bed mobility, pt sat on EOB x 10 minutes with min to mod assist for balance. L hemiparesis and L neglect noted. CIR consult recommended.  Pt will benefit from skilled PT to increase their independence and safety with mobility to allow discharge to the venue listed below.       Follow Up Recommendations CIR    Equipment Recommendations  Other (comment) (to be determined)    Recommendations for Other Services OT consult     Precautions / Restrictions Precautions Precautions: Fall Precaution Comments: h/o at least 3 falls in past 1 year per family Restrictions Weight Bearing Restrictions: No      Mobility  Bed Mobility Overal bed mobility: +2 for physical assistance;Needs Assistance Bed Mobility: Supine to Sit;Sit to Supine     Supine to sit: Max assist;+2 for physical assistance Sit to supine: Total assist;+2 for physical assistance   General bed mobility comments: max A to raise trunk and advance LLE, pt sat on EOB x 10 minutes with mod to min assist for balance 2* posterior lean  Transfers                 General transfer comment: not tested -pt to be taken to CT so needed to be in bed  Ambulation/Gait                Stairs            Wheelchair Mobility    Modified Rankin (Stroke Patients Only)       Balance Overall balance assessment: Needs assistance;History of Falls Sitting-balance support: Feet unsupported;Single extremity supported Sitting balance-Leahy Scale: Poor Sitting  balance - Comments: initially mod assist 2* posterior lean, then min/guard with RUE gripping bedrail and B feet supported, pt sat on EOB x 10 minutes, level of alertness varied                                     Pertinent Vitals/Pain Pain Assessment: Faces Faces Pain Scale: No hurt    Home Living Family/patient expects to be discharged to:: Private residence Living Arrangements: Alone Available Help at Discharge: Family;Friend(s) Type of Home: House Home Access: Stairs to enter   [employer] of Steps: 1 Home Layout: One level Home Equipment: Environmental consultant - 2 wheels      Prior Function Level of Independence: Independent         Comments: family reports pt has had several falls in past year and has had concerns about her safety; pt drove and did her own shopping PTA     Hand Dominance   Dominant Hand: Right    Extremity/Trunk Assessment   Upper Extremity Assessment: Defer to OT evaluation (grip absent, some synergistic movement LUE )           Lower Extremity Assessment: LLE deficits/detail   LLE Deficits / Details: difficult to fully assess due to lethargy, pt stated she could not feel light touch to L foot, no purposeful movement observed during eval, she  wasn't able to actively extend L knee when asked to do so   Cervical / Trunk Assessment: Normal  Communication   Communication: Other (comment) (difficult to assess 2* lethargy. )  Cognition Arousal/Alertness: Lethargic Behavior During Therapy: WFL for tasks assessed/performed Overall Cognitive Status: Impaired/Different from baseline Area of Impairment: Orientation;Attention;Memory Orientation Level: Situation;Place             General Comments: pt groggy, she opened eyes and responded to questions for short periods, when asked location she stated "MRI", she was able to follow simple commands    General Comments General comments (skin integrity, edema, etc.): L sided neglect  evident    Exercises     Assessment/Plan    PT Assessment Patient needs continued PT services  PT Problem List Decreased balance;Decreased strength;Decreased mobility;Decreased knowledge of use of DME;Impaired sensation;Decreased activity tolerance          PT Treatment Interventions Gait training;DME instruction;Functional mobility training;Balance training;Therapeutic exercise;Therapeutic activities;Patient/family education    PT Goals (Current goals can be found in the Care Plan section)  Acute Rehab PT Goals Patient Stated Goal: family would like CIR if possible PT Goal Formulation: With patient/family Time For Goal Achievement: 05/07/16 Potential to Achieve Goals: Good    Frequency Min 4X/week   Barriers to discharge Decreased caregiver support lives alone, family stated they'd be open to ALF or in-home aides following rehab    Co-evaluation               End of Session   Activity Tolerance: Patient tolerated treatment well;Patient limited by lethargy;Patient limited by fatigue Patient left: in bed;with call bell/phone within reach;with family/visitor present Nurse Communication: Mobility status         Time: [id number] PT Time Calculation (min) (ACUTE ONLY): 33 min   Charges:   PT Evaluation $PT Eval Moderate Complexity: 1 Procedure PT Treatments $Therapeutic Activity: 8-22 mins   PT G Codes:        Philomena Doheny 04/23/2016, 3:17 PM [phone]

## 2016-04-23 NOTE — Consult Note (Signed)
Admission H&P    Chief Complaint: New onset confusion loss of vision on the left side.  HPI: JADENCE KINLAW is an 82 y.o. female with a history of hypertension, hypothyroidism, diabetes mellitus and diverticulosis, presenting with new onset loss of vision on the left side as well as confusion, and to some extent agitation. She has no previous history of stroke nor TIA clinically, and has been taking aspirin 81 mg per day. CT scan of the head showed no acute intracranial abnormality. MRI was attempted, but patient was agitated and study could not be obtained. She was noted in the ED to be confused and to have complete visual loss on the left side. No focal weakness was noted. NIH stroke score the time of this evaluation was 10.  LSN: 04/21/2016 tPA Given: No: Beyond time under for treatment consideration mRankin:  Past Medical History:  Diagnosis Date  . ABSCESS 12/18/2009  . DIABETES MELLITUS, TYPE II 05/22/2009  . Diverticulosis   . Hemorrhoids   . HYPERGLYCEMIA 05/22/2009  . HYPERTENSION 04/13/2007  . HYPOTHYROIDISM 04/13/2007  . NEOPLASM, SKIN, UNCERTAIN BEHAVIOR 1/32/4401  . Tubular adenoma of colon 02/2001    Past Surgical History:  Procedure Laterality Date  . ABDOMINAL HYSTERECTOMY    . APPENDECTOMY    . CHOLECYSTECTOMY    . COLONOSCOPY  04-05-04   per Dr. Deatra Ina, no polyps   . OOPHORECTOMY    . THYROIDECTOMY     secondary to nodule, no cancer  . TONSILLECTOMY AND ADENOIDECTOMY      Family History  Problem Relation Age of Onset  . Stroke Father   . Hypertension Mother   . Huntington's disease Daughter 89  . Stroke Sister   . Colon cancer Neg Hx    Social History:  reports that she has never smoked. She has never used smokeless tobacco. She reports that she does not drink alcohol or use drugs.  Allergies:  Allergies  Allergen Reactions  . Piroxicam Other (See Comments)    REACTION: "SEVERE ALLERGY REACTION"  . Ativan [Lorazepam] Other (See Comments)    Caused  delirium, confusion, disorientation, slightly increased agitation.  . Diclofenac Sodium Nausea And Vomiting    Medications Prior to Admission  Medication Sig Dispense Refill  . amLODipine (NORVASC) 5 MG tablet TAKE ONE TABLET BY MOUTH ONCE DAILY 90 tablet 3  . aspirin 81 MG tablet Take 81 mg by mouth daily.      Marland Kitchen atorvastatin (LIPITOR) 20 MG tablet TAKE ONE TABLET BY MOUTH ONCE DAILY 90 tablet 3  . cyclobenzaprine (FLEXERIL) 10 MG tablet Take 1 tablet (10 mg total) by mouth 3 (three) times daily as needed for muscle spasms. 90 tablet 5  . ibuprofen (ADVIL,MOTRIN) 200 MG tablet Take 400 mg by mouth every 6 (six) hours as needed for moderate pain.    Marland Kitchen levothyroxine (SYNTHROID) 175 MCG tablet Take 1 tablet (175 mcg total) by mouth daily before breakfast. 30 tablet 11  . metFORMIN (GLUCOPHAGE) 1000 MG tablet Take 1 tablet (1,000 mg total) by mouth 2 (two) times daily with a meal. 180 tablet 3  . omeprazole (PRILOSEC) 40 MG capsule Take 1 capsule (40 mg total) by mouth daily. (Patient taking differently: Take 40 mg by mouth daily as needed (heartburn). ) 30 capsule 11  . sertraline (ZOLOFT) 100 MG tablet Take 1 tablet (100 mg total) by mouth daily. 30 tablet 5    ROS: History obtained from patient's daughter  General ROS: negative for - chills, fatigue, fever,  night sweats, weight gain or weight loss Psychological ROS: negative for - behavioral disorder, hallucinations, memory difficulties, mood swings or suicidal ideation Ophthalmic ROS: negative for - blurry vision, double vision, eye pain or loss of vision ENT ROS: negative for - epistaxis, nasal discharge, oral lesions, sore throat, tinnitus or vertigo Allergy and Immunology ROS: negative for - hives or itchy/watery eyes Hematological and Lymphatic ROS: negative for - bleeding problems, bruising or swollen lymph nodes Endocrine ROS: negative for - galactorrhea, hair pattern changes, polydipsia/polyuria or temperature  intolerance Respiratory ROS: negative for - cough, hemoptysis, shortness of breath or wheezing Cardiovascular ROS: negative for - chest pain, dyspnea on exertion, edema or irregular heartbeat Gastrointestinal ROS: Infrequent episodes of fecal incontinence Genito-Urinary ROS: negative for - dysuria, hematuria, incontinence or urinary frequency/urgency Musculoskeletal ROS: negative for - joint swelling or muscular weakness Neurological ROS: as noted in HPI, unstable gait for several months with recurrent falls Dermatological ROS: negative for rash and skin lesion changes  Physical Examination: Blood pressure (!) 168/85, pulse 80, temperature 98.5 F (36.9 C), temperature source Oral, resp. rate 18, height _0  (1.676 m), weight 74.8 kg (165 lb), SpO2 93 %.  HEENT-  Normocephalic, no lesions, without obvious abnormality.  Normal external eye and conjunctiva.  Normal TM's bilaterally.  Normal auditory canals and external ears. Normal external nose, mucus membranes and septum.  Normal pharynx. Neck supple with no masses, nodes, nodules or enlargement. Cardiovascular - regular rate and rhythm, S1, S2 normal, no murmur, click, rub or gallop Lungs - chest clear, no wheezing, rales, normal symmetric air entry Abdomen - soft, non-tender; bowel sounds normal; no masses,  no organomegaly Extremities - no joint deformities, effusion, or inflammation and no edema  Neurologic Examination: Mental Status: Somnolent, but easily aroused, confused, no acute distress.  Speech slightly slurred without evidence of aphasia. Able to follow commands with use of right extremities. Patient had complete neglect of left side. Cranial Nerves: II-dense left homonymous hemianopsia. III/IV/VI-Pupils were equal and reacted normally to light. Persistent conjugate gaze to the right side.    V/VII-no facial numbness and no facial weakness. VIII-normal. X-mild dysarthria. Motor: 5/5 bilaterally with normal tone and  bulk Sensory: Localization of tactile stimuli on the left side. Deep Tendon Reflexes: 1+ and symmetric. Plantars: Extensor on the left and flexor on the right.  Results for orders placed or performed during the hospital encounter of 04/22/16 (from the past 48 hour(s))  Urinalysis, Routine w reflex microscopic (not at [facility])     Status: Abnormal   Collection Time: 04/22/16  7:11 PM  Result Value Ref Range   Color, Urine YELLOW YELLOW   APPearance CLEAR CLEAR   Specific Gravity, Urine 1.018 1.005 - 1.030   pH 6.0 5.0 - 8.0   Glucose, UA NEGATIVE NEGATIVE mg/dL   Hgb urine dipstick NEGATIVE NEGATIVE   Bilirubin Urine NEGATIVE NEGATIVE   Ketones, ur 40 (A) NEGATIVE mg/dL   Protein, ur 30 (A) NEGATIVE mg/dL   Nitrite NEGATIVE NEGATIVE   Leukocytes, UA NEGATIVE NEGATIVE  Urine microscopic-add on     Status: Abnormal   Collection Time: 04/22/16  7:11 PM  Result Value Ref Range   Squamous Epithelial / LPF 0-5 (A) NONE SEEN   WBC, UA 0-5 0 - 5 WBC/hpf   RBC / HPF NONE SEEN 0 - 5 RBC/hpf   Bacteria, UA FEW (A) NONE SEEN  I-stat chem 8, ed     Status: Abnormal   Collection Time: 04/22/16  7:28 PM  Result Value Ref Range   Sodium 139 135 - 145 mmol/L   Potassium 3.5 3.5 - 5.1 mmol/L   Chloride 98 (L) 101 - 111 mmol/L   BUN 14 6 - 20 mg/dL   Creatinine, Ser 0.80 0.44 - 1.00 mg/dL   Glucose, Bld 131 (H) 65 - 99 mg/dL   Calcium, Ion 1.15 1.15 - 1.40 mmol/L   TCO2 29 0 - 100 mmol/L   Hemoglobin 12.2 12.0 - 15.0 g/dL   HCT 36.0 36.0 - 46.0 %  Comprehensive metabolic panel     Status: Abnormal   Collection Time: 04/22/16  7:32 PM  Result Value Ref Range   Sodium 137 135 - 145 mmol/L   Potassium 3.5 3.5 - 5.1 mmol/L   Chloride 101 101 - 111 mmol/L   CO2 27 22 - 32 mmol/L   Glucose, Bld 130 (H) 65 - 99 mg/dL   BUN 12 6 - 20 mg/dL   Creatinine, Ser 0.91 0.44 - 1.00 mg/dL   Calcium 9.4 8.9 - 10.3 mg/dL   Total Protein 7.1 6.5 - 8.1 g/dL   Albumin 3.8 3.5 - 5.0 g/dL   AST 22 15 - 41 U/L    ALT 17 14 - 54 U/L   Alkaline Phosphatase 73 38 - 126 U/L   Total Bilirubin 1.8 (H) 0.3 - 1.2 mg/dL   GFR calc non Af Amer 56 (L) >60 mL/min   GFR calc Af Amer >60 >60 mL/min    Comment: (NOTE) The eGFR has been calculated using the CKD EPI equation. This calculation has not been validated in all clinical situations. eGFR's persistently <60 mL/min signify possible Chronic Kidney Disease.    Anion gap 9 5 - 15  CK     Status: None   Collection Time: 04/22/16  7:32 PM  Result Value Ref Range   Total CK 155 38 - 234 U/L  I-stat troponin, ED     Status: None   Collection Time: 04/22/16  7:39 PM  Result Value Ref Range   Troponin i, poc 0.01 0.00 - 0.08 ng/mL   Comment 3            Comment: Due to the release kinetics of cTnI, a negative result within the first hours of the onset of symptoms does not rule out myocardial infarction with certainty. If myocardial infarction is still suspected, repeat the test at appropriate intervals.   CBC with Differential/Platelet     Status: Abnormal   Collection Time: 04/22/16  9:00 PM  Result Value Ref Range   WBC 9.9 4.0 - 10.5 K/uL   RBC 3.92 3.87 - 5.11 MIL/uL   Hemoglobin 12.2 12.0 - 15.0 g/dL   HCT 35.3 (L) 36.0 - 46.0 %   MCV 90.1 78.0 - 100.0 fL   MCH 31.1 26.0 - 34.0 pg   MCHC 34.6 30.0 - 36.0 g/dL   RDW 13.3 11.5 - 15.5 %   Platelets 182 150 - 400 K/uL   Neutrophils Relative % 70 %   Neutro Abs 7.0 1.7 - 7.7 K/uL   Lymphocytes Relative 21 %   Lymphs Abs 2.1 0.7 - 4.0 K/uL   Monocytes Relative 9 %   Monocytes Absolute 0.8 0.1 - 1.0 K/uL   Eosinophils Relative 0 %   Eosinophils Absolute 0.0 0.0 - 0.7 K/uL   Basophils Relative 0 %   Basophils Absolute 0.0 0.0 - 0.1 K/uL   Dg Chest 1 View  Result Date: 04/22/2016 CLINICAL DATA:  Confusion,  severe headache today. Fell yesterday. Found underneath bed today. EXAM: CHEST 1 VIEW COMPARISON:  Chest radiograph June 29, 2006 FINDINGS: Cardiac silhouette is mildly enlarged.  Mildly tortuous and atherosclerotic aorta. Strandy densities LEFT lung. No pleural effusion or focal consolidation. No pneumothorax. Osteopenia. Soft tissue planes are unremarkable. IMPRESSION: Mild cardiomegaly.  Similar LEFT lung scarring. Electronically Signed   By: Elon Alas M.D.   On: 04/22/2016 20:35   Ct Head Wo Contrast  Result Date: 04/22/2016 CLINICAL DATA:  Found on floor, previous fall with head injury EXAM: CT HEAD WITHOUT CONTRAST CT CERVICAL SPINE WITHOUT CONTRAST TECHNIQUE: Multidetector CT imaging of the head and cervical spine was performed following the standard protocol without intravenous contrast. Multiplanar CT image reconstructions of the cervical spine were also generated. COMPARISON:  MRI 07/24/2015 FINDINGS: CT HEAD FINDINGS Brain: No acute territorial infarction, intracranial hemorrhage or extra-axial fluid collection is seen. Mild atrophy. Moderate periventricular, subcortical and deep white matter hypodensities consistent with small vessel disease. Decreased density in the pons, felt to correlate with abnormal small vessel changes on prior MRI. Heterogenous density in the bilateral thalamus, also likely relate to mild signal abnormality noted on T2 images. Vascular: No hyperdense vessels. There are carotid artery calcifications. There are vertebral artery calcifications. Skull: Mastoid air cells are clear.  There is no skull fracture Sinuses/Orbits: Mild mucosal thickening in the ethmoid sinuses. No acute orbital abnormality. Other: None CT CERVICAL SPINE FINDINGS Alignment: No subluxation.  Facet alignment is maintained. Skull base and vertebrae: Craniovertebral junction appears intact. Vertebral body heights are maintained. There is no fracture identified. Soft tissues and spinal canal: No prevertebral fluid or swelling. No visible canal hematoma. Disc levels: Moderate degenerative disc changes at C5-C6 and C6-C7 with endplate changes and osteophytes. Multilevel facet  arthropathy. No significant foraminal stenosis. Upper chest: Lung apices clear. Enlarged left lobe of thyroid gland. Other: Carotid artery calcifications. IMPRESSION: 1. No definite CT evidence for acute intracranial abnormality. Moderate periventricular, subcortical and deep white matter small vessel disease. 2. No acute fracture or malalignment of the cervical spine. Moderate degenerative disc changes most notable at C5-C6 and C6-C7. Electronically Signed   By: Donavan Foil M.D.   On: 04/22/2016 20:29   Ct Cervical Spine Wo Contrast  Result Date: 04/22/2016 CLINICAL DATA:  Found on floor, previous fall with head injury EXAM: CT HEAD WITHOUT CONTRAST CT CERVICAL SPINE WITHOUT CONTRAST TECHNIQUE: Multidetector CT imaging of the head and cervical spine was performed following the standard protocol without intravenous contrast. Multiplanar CT image reconstructions of the cervical spine were also generated. COMPARISON:  MRI 07/24/2015 FINDINGS: CT HEAD FINDINGS Brain: No acute territorial infarction, intracranial hemorrhage or extra-axial fluid collection is seen. Mild atrophy. Moderate periventricular, subcortical and deep white matter hypodensities consistent with small vessel disease. Decreased density in the pons, felt to correlate with abnormal small vessel changes on prior MRI. Heterogenous density in the bilateral thalamus, also likely relate to mild signal abnormality noted on T2 images. Vascular: No hyperdense vessels. There are carotid artery calcifications. There are vertebral artery calcifications. Skull: Mastoid air cells are clear.  There is no skull fracture Sinuses/Orbits: Mild mucosal thickening in the ethmoid sinuses. No acute orbital abnormality. Other: None CT CERVICAL SPINE FINDINGS Alignment: No subluxation.  Facet alignment is maintained. Skull base and vertebrae: Craniovertebral junction appears intact. Vertebral body heights are maintained. There is no fracture identified. Soft tissues  and spinal canal: No prevertebral fluid or swelling. No visible canal hematoma. Disc levels: Moderate degenerative disc changes  at C5-C6 and C6-C7 with endplate changes and osteophytes. Multilevel facet arthropathy. No significant foraminal stenosis. Upper chest: Lung apices clear. Enlarged left lobe of thyroid gland. Other: Carotid artery calcifications. IMPRESSION: 1. No definite CT evidence for acute intracranial abnormality. Moderate periventricular, subcortical and deep white matter small vessel disease. 2. No acute fracture or malalignment of the cervical spine. Moderate degenerative disc changes most notable at C5-C6 and C6-C7. Electronically Signed   By: Donavan Foil M.D.   On: 04/22/2016 20:29    Assessment: 82 y.o. 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  Stroke Risk Factors - diabetes mellitus, family history and hypertension  Plan: 1. HgbA1c, fasting lipid panel 2. MRI, MRA  of the brain without contrast 3. PT consult, OT consult, Speech consult 4. Echocardiogram 5. Carotid dopplers 6. Prophylactic therapy-Antiplatelet med: Aspirin  7. Risk factor modification 8. Telemetry monitoring  C.R. Nicole Kindred, MD [facility] [phone]  04/23/2016, 12:23 AM

## 2016-04-23 NOTE — Progress Notes (Signed)
Rehab Admissions Coordinator Note:  Patient was screened by Retta Diones for appropriateness for an Inpatient Acute Rehab Consult.  At this time, PT/OT evaluations are pending.  SLP recommending CIR consult.  Will await PT/OT evals to determine if functional deficits are present.  Retta Diones 04/23/2016, 1:00 PM  I can be reached at [phone].

## 2016-04-23 NOTE — Progress Notes (Signed)
Patient arrived to 5M13 from ED. Patient lethargic but responds to voice. Patient oriented to self. Telemetry monitor applied. Vitals signs taken and stable. Family updated at bedside. Will continue to monitor with Q2 hour neuro and vital checks.

## 2016-04-23 NOTE — Progress Notes (Signed)
*  PRELIMINARY RESULTS* Vascular Ultrasound Carotid Duplex (Doppler) has been completed.  Preliminary findings: Bilateral 1-39% ICA stenosis, antegrade vertebral flow.   Amy [name] 04/23/2016, 5:30 PM

## 2016-04-23 NOTE — Progress Notes (Signed)
PROGRESS NOTE        PATIENT DETAILS Name: Amy [name] Age: 82 y.o. Sex: female Date of Birth: Jul 31, 1929 Admit Date: 04/22/2016 Admitting Physician Etta Quill, DO PCP:FRY,STEPHEN A, MD  Brief Narrative: Patient is a 82 y.o. female with past medical history of type 2 diabetes, hypertension, dyslipidemia, presented to the hospital with left-sided vision loss and confusion. Thought to have acute CVA, see below for further details.  Subjective: Appears to have left-sided hemi-neglect. Speech slow but mostly clear. Follows simple commands after repeated attempts.  Assessment/Plan: Acute right brain infarct: Has left-sided weakness and left-sided hemi-neglect on exam, unable to complete MRI brain. Spoke with Dr. Leonie Man, he recommended a CT angiogram of the head and neck-no need to pursue MRI at this time. Await echo/carotid. LDL is 114, A1c pending. Continue aspirin, and await further workup and further recommendations from neurology.  Hypertension: Allow some permissive hypertension-blood pressure on the higher side-will slowly reinitiate antihypertensives.  Type 2 diabetes: CBGs stable with SSI, oral hypoglycemics remain on hold. Await A1c  Dyslipidemia: LDL 114-not at goal-on 20 mg of Lipitor as outpatient- increase to 40 mg. Repeat LDL in 3 months.  Hypothyroidism: Continue levothyroxine  DVT Prophylaxis: Prophylactic Lovenox  Code Status:  DNR  Family Communication: Son at bedside  Disposition Plan: Remain inpatient-?CIR on discharge  Antimicrobial agents: None  Procedures: None  CONSULTS:  neurology  Time spent: 25 minutes-Greater than 50% of this time was spent in counseling, explanation of diagnosis, planning of further management, and coordination of care.  MEDICATIONS: Anti-infectives    None      Scheduled Meds: . aspirin  300 mg Rectal Daily   Or  . aspirin  325 mg Oral Daily  . atorvastatin  20 mg Oral Daily  .  enoxaparin (LOVENOX) injection  40 mg Subcutaneous QHS  . insulin aspart  0-15 Units Subcutaneous TID WC  . levothyroxine  175 mcg Oral QAC breakfast  . pantoprazole  80 mg Oral Daily  . sertraline  100 mg Oral Daily   Continuous Infusions: PRN Meds:.acetaminophen, ondansetron (ZOFRAN) IV   PHYSICAL EXAM: Vital signs: Vitals:   04/23/16 0447 04/23/16 0652 04/23/16 1345 04/23/16 1652  BP: (!) 161/72 (!) 164/69 (!) 175/79 (!) 185/85  Pulse: 69 67 81 84  Resp: 16 16 20 20   Temp: 98.8 F (37.1 C) 98.6 F (37 C) 98.7 F (37.1 C) 99.1 F (37.3 C)  TempSrc: Oral Oral Oral Oral  SpO2: 100% 97% 98% 95%  Weight:      Height:       Filed Weights   04/22/16 1811  Weight: 74.8 kg (165 lb)   Body mass index is 26.63 kg/m.   General appearance :Awake, slightly lethargic but follows Commands. b Eyes:, pupils equally reactive to light and accomodation,no scleral icterus.Pink conjunctiva HEENT: Atraumatic and Normocephalic Neck: supple, no JVD. No cervical lymphadenopathy. No thyromegaly Resp:Good air entry bilaterally, no added sounds  CVS: S1 S2 regular GI: Bowel sounds present, Non tender and not distended with no gaurding, rigidity or rebound.No organomegaly Extremities: B/L Lower Ext shows no edema, both legs are warm to touch Neurology: Left-sided weakness-approximately 3/5. Marland Kitchen Musculoskeletal:No digital cyanosis Skin:No Rash, warm and dry Wounds:N/A  I have personally reviewed following labs and imaging studies  LABORATORY DATA: CBC:  Recent Labs Lab 04/22/16 1928 04/22/16 2100  WBC  --  9.9  NEUTROABS  --  7.0  HGB 12.2 12.2  HCT 36.0 35.3*  MCV  --  90.1  PLT  --  [id number]    Basic Metabolic Panel:  Recent Labs Lab 04/22/16 1928 04/22/16 1932  NA 139 137  K 3.5 3.5  CL 98* 101  CO2  --  27  GLUCOSE 131* 130*  BUN 14 12  CREATININE 0.80 0.91  CALCIUM  --  9.4    GFR: Estimated Creatinine Clearance: 47.6 mL/min (by C-G formula based on SCr of 0.91  mg/dL).  Liver Function Tests:  Recent Labs Lab 04/22/16 1932  AST 22  ALT 17  ALKPHOS 73  BILITOT 1.8*  PROT 7.1  ALBUMIN 3.8   No results for input(s): LIPASE, AMYLASE in the last 168 hours. No results for input(s): AMMONIA in the last 168 hours.  Coagulation Profile: No results for input(s): INR, PROTIME in the last 168 hours.  Cardiac Enzymes:  Recent Labs Lab 04/22/16 1932  CKTOTAL 155    BNP (last 3 results) No results for input(s): PROBNP in the last 8760 hours.  HbA1C: No results for input(s): HGBA1C in the last 72 hours.  CBG:  Recent Labs Lab 04/22/16 2332 04/23/16 0612 04/23/16 1124 04/23/16 1605  GLUCAP 132* 123* 145* 133*    Lipid Profile:  Recent Labs  04/23/16 0435  CHOL 206*  HDL 65  LDLCALC 114*  TRIG 135  CHOLHDL 3.2    Thyroid Function Tests: No results for input(s): TSH, T4TOTAL, FREET4, T3FREE, THYROIDAB in the last 72 hours.  Anemia Panel: No results for input(s): VITAMINB12, FOLATE, FERRITIN, TIBC, IRON, RETICCTPCT in the last 72 hours.  Urine analysis:    Component Value Date/Time   COLORURINE YELLOW 04/22/2016 1911   APPEARANCEUR CLEAR 04/22/2016 1911   LABSPEC 1.018 04/22/2016 1911   PHURINE 6.0 04/22/2016 1911   GLUCOSEU NEGATIVE 04/22/2016 1911   HGBUR NEGATIVE 04/22/2016 1911   HGBUR negative 12/18/2008 1559   BILIRUBINUR NEGATIVE 04/22/2016 1911   BILIRUBINUR n 12/11/2014 1417   KETONESUR 40 (A) 04/22/2016 1911   PROTEINUR 30 (A) 04/22/2016 1911   UROBILINOGEN 0.2 12/11/2014 1417   UROBILINOGEN 0.2 01/16/2013 1358   NITRITE NEGATIVE 04/22/2016 1911   LEUKOCYTESUR NEGATIVE 04/22/2016 1911    Sepsis Labs: Lactic Acid, Venous No results found for: LATICACIDVEN  MICROBIOLOGY: No results found for this or any previous visit (from the past 240 hour(s)).  RADIOLOGY STUDIES/RESULTS: Dg Chest 1 View  Result Date: 04/22/2016 CLINICAL DATA:  Confusion, severe headache today. Fell yesterday. Found  underneath bed today. EXAM: CHEST 1 VIEW COMPARISON:  Chest radiograph June 29, 2006 FINDINGS: Cardiac silhouette is mildly enlarged. Mildly tortuous and atherosclerotic aorta. Strandy densities LEFT lung. No pleural effusion or focal consolidation. No pneumothorax. Osteopenia. Soft tissue planes are unremarkable. IMPRESSION: Mild cardiomegaly.  Similar LEFT lung scarring. Electronically Signed   By: Elon Alas M.D.   On: 04/22/2016 20:35   Ct Head Wo Contrast  Result Date: 04/22/2016 CLINICAL DATA:  Found on floor, previous fall with head injury EXAM: CT HEAD WITHOUT CONTRAST CT CERVICAL SPINE WITHOUT CONTRAST TECHNIQUE: Multidetector CT imaging of the head and cervical spine was performed following the standard protocol without intravenous contrast. Multiplanar CT image reconstructions of the cervical spine were also generated. COMPARISON:  MRI 07/24/2015 FINDINGS: CT HEAD FINDINGS Brain: No acute territorial infarction, intracranial hemorrhage or extra-axial fluid collection is seen. Mild atrophy. Moderate periventricular, subcortical and deep white matter hypodensities consistent with small vessel disease.  Decreased density in the pons, felt to correlate with abnormal small vessel changes on prior MRI. Heterogenous density in the bilateral thalamus, also likely relate to mild signal abnormality noted on T2 images. Vascular: No hyperdense vessels. There are carotid artery calcifications. There are vertebral artery calcifications. Skull: Mastoid air cells are clear.  There is no skull fracture Sinuses/Orbits: Mild mucosal thickening in the ethmoid sinuses. No acute orbital abnormality. Other: None CT CERVICAL SPINE FINDINGS Alignment: No subluxation.  Facet alignment is maintained. Skull base and vertebrae: Craniovertebral junction appears intact. Vertebral body heights are maintained. There is no fracture identified. Soft tissues and spinal canal: No prevertebral fluid or swelling. No visible canal  hematoma. Disc levels: Moderate degenerative disc changes at C5-C6 and C6-C7 with endplate changes and osteophytes. Multilevel facet arthropathy. No significant foraminal stenosis. Upper chest: Lung apices clear. Enlarged left lobe of thyroid gland. Other: Carotid artery calcifications. IMPRESSION: 1. No definite CT evidence for acute intracranial abnormality. Moderate periventricular, subcortical and deep white matter small vessel disease. 2. No acute fracture or malalignment of the cervical spine. Moderate degenerative disc changes most notable at C5-C6 and C6-C7. Electronically Signed   By: Donavan Foil M.D.   On: 04/22/2016 20:29   Ct Cervical Spine Wo Contrast  Result Date: 04/22/2016 CLINICAL DATA:  Found on floor, previous fall with head injury EXAM: CT HEAD WITHOUT CONTRAST CT CERVICAL SPINE WITHOUT CONTRAST TECHNIQUE: Multidetector CT imaging of the head and cervical spine was performed following the standard protocol without intravenous contrast. Multiplanar CT image reconstructions of the cervical spine were also generated. COMPARISON:  MRI 07/24/2015 FINDINGS: CT HEAD FINDINGS Brain: No acute territorial infarction, intracranial hemorrhage or extra-axial fluid collection is seen. Mild atrophy. Moderate periventricular, subcortical and deep white matter hypodensities consistent with small vessel disease. Decreased density in the pons, felt to correlate with abnormal small vessel changes on prior MRI. Heterogenous density in the bilateral thalamus, also likely relate to mild signal abnormality noted on T2 images. Vascular: No hyperdense vessels. There are carotid artery calcifications. There are vertebral artery calcifications. Skull: Mastoid air cells are clear.  There is no skull fracture Sinuses/Orbits: Mild mucosal thickening in the ethmoid sinuses. No acute orbital abnormality. Other: None CT CERVICAL SPINE FINDINGS Alignment: No subluxation.  Facet alignment is maintained. Skull base and  vertebrae: Craniovertebral junction appears intact. Vertebral body heights are maintained. There is no fracture identified. Soft tissues and spinal canal: No prevertebral fluid or swelling. No visible canal hematoma. Disc levels: Moderate degenerative disc changes at C5-C6 and C6-C7 with endplate changes and osteophytes. Multilevel facet arthropathy. No significant foraminal stenosis. Upper chest: Lung apices clear. Enlarged left lobe of thyroid gland. Other: Carotid artery calcifications. IMPRESSION: 1. No definite CT evidence for acute intracranial abnormality. Moderate periventricular, subcortical and deep white matter small vessel disease. 2. No acute fracture or malalignment of the cervical spine. Moderate degenerative disc changes most notable at C5-C6 and C6-C7. Electronically Signed   By: Donavan Foil M.D.   On: 04/22/2016 20:29     LOS: 1 day   Oren Binet, MD  [facility] Pager:[phone]  If 7PM-7AM, please contact night-coverage [email or web address] Password [email or web address] 04/23/2016, 5:18 PM

## 2016-04-23 NOTE — Evaluation (Signed)
Clinical/Bedside Swallow Evaluation Patient Details  Name: Amy [name] MRN: [id number] Date of Birth: [birth year]  Today's Date: 04/23/2016 Time: SLP Start Time (ACUTE ONLY): 24 SLP Stop Time (ACUTE ONLY): 1148 SLP Time Calculation (min) (ACUTE ONLY): 18 min  Past Medical History:  Past Medical History:  Diagnosis Date  . ABSCESS 12/18/2009  . DIABETES MELLITUS, TYPE II 05/22/2009  . Diverticulosis   . Hemorrhoids   . HYPERGLYCEMIA 05/22/2009  . HYPERTENSION 04/13/2007  . HYPOTHYROIDISM 04/13/2007  . NEOPLASM, SKIN, UNCERTAIN BEHAVIOR [id number]  . Tubular adenoma of colon 02/2001   Past Surgical History:  Past Surgical History:  Procedure Laterality Date  . ABDOMINAL HYSTERECTOMY    . APPENDECTOMY    . CHOLECYSTECTOMY    . COLONOSCOPY  04-05-04   per Dr. Deatra Ina, no polyps   . OOPHORECTOMY    . THYROIDECTOMY     secondary to nodule, no cancer  . TONSILLECTOMY AND ADENOIDECTOMY     HPI:  [name] is a 82 y.o. female with history of HTN, DM, hypothyroidism, and diverticulosis presenting with L sided vision loss and confusion. She did not receive IV t-PA due to beyond time treatment window.    Assessment / Plan / Recommendation Clinical Impression  Patient presents with a functional oropharyngeal swallow without overt indication of aspiration. Increased risk for aspiration given AMS/cognitive deficits s/p likely right MCA CVA. Education complete with family. Will f/u briefly to monitor for tolerance given aspiration risk.     Aspiration Risk  Moderate aspiration risk    Diet Recommendation Regular;Thin liquid   Liquid Administration via: Cup;Straw Medication Administration: Whole meds with liquid Supervision: Staff to assist with self feeding;Full supervision/cueing for compensatory strategies Compensations: Slow rate;Small sips/bites;Lingual sweep for clearance of pocketing Postural Changes: Seated upright at 90 degrees    Other  Recommendations Oral  Care Recommendations: Oral care BID   Follow up Recommendations Inpatient Rehab      Frequency and Duration min 1 x/week  1 week       Prognosis        Swallow Study   General HPI: [name] is a 82 y.o. female with history of HTN, DM, hypothyroidism, and diverticulosis presenting with L sided vision loss and confusion. She did not receive IV t-PA due to beyond time treatment window.  Type of Study: Bedside Swallow Evaluation Previous Swallow Assessment: none Diet Prior to this Study: Regular;Thin liquids Temperature Spikes Noted: No Respiratory Status: Room air History of Recent Intubation: No Behavior/Cognition: Alert;Cooperative;Pleasant mood Oral Cavity Assessment: Within Functional Limits Oral Care Completed by SLP: No Oral Cavity - Dentition: Adequate natural dentition Vision: Functional for self-feeding Self-Feeding Abilities: Needs assist (due to left sided neglect) Patient Positioning: Upright in bed Baseline Vocal Quality: Normal Volitional Cough: Strong Volitional Swallow: Able to elicit    Oral/Motor/Sensory Function Overall Oral Motor/Sensory Function: Mild impairment (left sensory impairment)   Ice Chips Ice chips: Not tested   Thin Liquid Thin Liquid: Within functional limits Presentation: Cup;Self Fed;Straw    Nectar Thick Nectar Thick Liquid: Not tested   Honey Thick Honey Thick Liquid: Not tested   Puree Puree: Within functional limits Presentation: Spoon   Solid   GO  Amy Klier MA, CCC-SLP [phone]  Solid: Within functional limits        Lemonte Al Meryl 04/23/2016,12:36 PM

## 2016-04-24 ENCOUNTER — Other Ambulatory Visit (HOSPITAL_COMMUNITY): Payer: Medicare Other

## 2016-04-24 ENCOUNTER — Inpatient Hospital Stay (HOSPITAL_COMMUNITY): Payer: Medicare Other

## 2016-04-24 ENCOUNTER — Inpatient Hospital Stay (HOSPITAL_COMMUNITY)
Admit: 2016-04-24 | Discharge: 2016-04-24 | Disposition: A | Discharge status: Home / Self Care | Payer: Medicare Other | Attending: Neurology | Admitting: Neurology

## 2016-04-24 DIAGNOSIS — R414 Neurologic neglect syndrome: Secondary | ICD-10-CM

## 2016-04-24 DIAGNOSIS — I10 Essential (primary) hypertension: Secondary | ICD-10-CM

## 2016-04-24 DIAGNOSIS — E785 Hyperlipidemia, unspecified: Secondary | ICD-10-CM

## 2016-04-24 DIAGNOSIS — I6330 Cerebral infarction due to thrombosis of unspecified cerebral artery: Secondary | ICD-10-CM

## 2016-04-24 DIAGNOSIS — E119 Type 2 diabetes mellitus without complications: Secondary | ICD-10-CM

## 2016-04-24 LAB — GLUCOSE, CAPILLARY
GLUCOSE-CAPILLARY: 130 mg/dL — AB (ref 65–99)
GLUCOSE-CAPILLARY: 163 mg/dL — AB (ref 65–99)
Glucose-Capillary: 158 mg/dL — ABNORMAL HIGH (ref 65–99)

## 2016-04-24 LAB — HEMOGLOBIN A1C
HEMOGLOBIN A1C: 7.1 % — AB (ref 4.8–5.6)
MEAN PLASMA GLUCOSE: 157 mg/dL

## 2016-04-24 LAB — URINE CULTURE

## 2016-04-24 MED ORDER — LEVETIRACETAM ER 500 MG PO TB24
500.0000 mg | ORAL_TABLET | Freq: Every day | ORAL | Status: DC
  Administered 2016-04-24 – 2016-04-25 (×2): 500 mg via ORAL
  Filled 2016-04-24 (×2): qty 1

## 2016-04-24 MED ORDER — LORAZEPAM 2 MG/ML IJ SOLN
1.0000 mg | Freq: Once | INTRAMUSCULAR | Status: DC | PRN
  Filled 2016-04-24: qty 1

## 2016-04-24 NOTE — Progress Notes (Signed)
Inpatient Rehabilitation  Met with patient's oldest daughter, POW and her spouse to discuss team's recommendation for IP Rehab.  Shared booklets and answered questions.  Family agreeable with plan and I have initiated insurance authorization.  Plan to follow up with patient when she returns from her procedure.  Will follow for timing of medical readiness, insurance authorization, and bed availability. Please call with questions.  Carmelia Roller., CCC/SLP Admission Coordinator  [place]  Cell [phone]

## 2016-04-24 NOTE — Progress Notes (Signed)
Speech Language Pathology Treatment: Dysphagia;Cognitive-Linquistic  Patient Details Name: Amy [name] MRN: [id number] DOB: 19-Mar-1930 Today's Date: 04/24/2016 Time: 0982-8675 SLP Time Calculation (min) (ACUTE ONLY): 20 min  Assessment / Plan / Recommendation Clinical Impression  Dysphagia treatment focused on diet tolerance. Patient able to self feed po trials today without overt indication of aspiration and improved bolus awareness. Timely oral transit of bolus noted with full appearing airway protection. No f/u for dysphagia indicated.   Cognitively, patient with significant improvement in overall function. Able to attend to left visual field with supervision cues today with mild residuals left sided neglect/inattention noted. Patient alert oriented to person, time, and place. Min verbal cueing provided for awareness of current diagnosis, MRI results, etc. Patient remains with decreased sustained attention, moderately verbose with decreased topic maintenance during conversation. Patient making steady progress with functional goals, continuing to benefit from SLP services to maximize cognitive function.    HPI HPI: Amy [name] is a 82 y.o. female with history of HTN, DM, hypothyroidism, and diverticulosis presenting with L sided vision loss and confusion. She did not receive IV t-PA due to beyond time treatment window.       SLP Plan  Other (Comment) (dysphagia goals met, continue cognitive goals)     Recommendations  Diet recommendations: Regular;Thin liquid Liquids provided via: Cup;Straw Medication Administration: Whole meds with liquid Supervision: Patient able to self feed Compensations: Slow rate;Small sips/bites Postural Changes and/or Swallow Maneuvers: Seated upright 90 degrees                Oral Care Recommendations: Oral care BID Follow up Recommendations: Inpatient Rehab Plan: Other (Comment) (dysphagia goals met, continue cognitive goals)        GO              Gabriel Rainwater MA, CCC-SLP [phone]   Amy [name] 04/24/2016, 3:45 PM

## 2016-04-24 NOTE — Progress Notes (Signed)
Physical Therapy Treatment Patient Details Name: Amy [name] MRN: [id number] DOB: [birth year] Today's Date: 04/24/2016    History of Present Illness 82 y.o. female admitted with L hemiparesis, L homonymous hemianopsia. Initial CT negative for acute infarct; MRI brain- No acute infarct. Remote small vessel infarct in the left cerebellum. Bilateral pons infarcts. ?seizure with post-ictal state. PMH of DM, HTN    PT Comments    Patient attending to left side of room much improved. Does not attend to Lt side of body consistently and at one point did not seem to recognize her own left arm. Very distracted (internally and externally) with frequent redirection and repetition of instructions needed. Limited by nausea after standing x 20 seconds.   Follow Up Recommendations  CIR     Equipment Recommendations   (TBD)    Recommendations for Other Services       Precautions / Restrictions Precautions Precautions: Fall Precaution Comments: h/o at least 3 falls in past 1 year per family, pt resistant to using device Restrictions Weight Bearing Restrictions: No    Mobility  Bed Mobility Overal bed mobility: +2 for physical assistance;Needs Assistance Bed Mobility: Supine to Sit;Sit to Supine     Supine to sit: +2 for physical assistance;Mod assist Sit to supine: +2 for physical assistance;Max assist   General bed mobility comments: very inefficient movement, requiring assist for trunk and LEs  Transfers Overall transfer level: Needs assistance   Transfers: Sit to/from Stand Sit to Stand: +2 physical assistance;Mod assist         General transfer comment: pt stood from bed x 20 seconds, assist to rise and steady and to place L hand on walker, pt with nausea, requiring return to sitting   Ambulation/Gait                 Stairs            Wheelchair Mobility    Modified Rankin (Stroke Patients Only) Modified Rankin (Stroke Patients Only) Pre-Morbid Rankin  Score: Moderate disability Modified Rankin: Severe disability     Balance Overall balance assessment: Needs assistance Sitting-balance support: No upper extremity supported;Feet supported Sitting balance-Leahy Scale: Poor Sitting balance - Comments: posterior lean, sat EOB approx 10 minutes   Standing balance support: Bilateral upper extremity supported Standing balance-Leahy Scale: Poor                      Cognition Arousal/Alertness: Awake/alert Behavior During Therapy: WFL for tasks assessed/performed Overall Cognitive Status: Impaired/Different from baseline Area of Impairment: Attention;Memory;Following commands;Safety/judgement;Awareness   Current Attention Level: Focused (highly distractable) Memory: Decreased short-term memory Following Commands: Follows one step commands with increased time (and multimodal cues) Safety/Judgement: Decreased awareness of safety;Decreased awareness of deficits Awareness:  (pre-intellectual)   General Comments: Per family discussion, pt with poor awareness of deficits prior to this incident    Exercises      General Comments General comments (skin integrity, edema, etc.): daughter and son present; unable to attempt gait due to severe nausea with standing (pt denied dizziness)      Pertinent Vitals/Pain Pain Assessment: No/denies pain    Home Living Family/patient expects to be discharged to:: Private residence Living Arrangements: Alone Available Help at Discharge: Family;Friend(s) Type of Home: House Home Access: Stairs to enter   Home Layout: One level Home Equipment: Environmental consultant - 2 wheels      Prior Function Level of Independence: Independent      Comments: family reports pt has had several  falls in past year and has had concerns about her safety; pt drove and did her own shopping PTA, son stating she was only supposed to drive short distances   PT Goals (current goals can now be found in the care plan section) Acute  Rehab PT Goals Patient Stated Goal: family would like CIR if possible Time For Goal Achievement: 05/07/16 Progress towards PT goals: Progressing toward goals    Frequency    Min 4X/week      PT Plan Current plan remains appropriate    Co-evaluation PT/OT/SLP Co-Evaluation/Treatment: Yes Reason for Co-Treatment: For patient/therapist safety;Complexity of the patient's impairments (multi-system involvement) PT goals addressed during session: Mobility/safety with mobility;Balance;Proper use of DME OT goals addressed during session: ADL's and self-care     End of Session Equipment Utilized During Treatment: Gait belt Activity Tolerance: Treatment limited secondary to medical complications (Comment) (nausea) Patient left: in bed;with call bell/phone within reach;with family/visitor present;with bed alarm set     Time: 1341-1408 PT Time Calculation (min) (ACUTE ONLY): 27 min  Charges:  $Therapeutic Activity: 8-22 mins                    G CodesJeanie Cooks [place] 05/10/16, 4:41 PM Pager [phone]

## 2016-04-24 NOTE — H&P (Signed)
Physical Medicine and Rehabilitation Admission H&P    Chief Complaint  Patient presents with  . Fall  : HPI: Amy [name] is a 82 y.o. right handed female with history of diabetes mellitus, hypertension. Per chart review patient lives alone and was independent and still driving short distances prior to admission. Presented 04/22/2016 with altered mental status as well as decreased vision on the left. Daughter had reported she had difficulty with her balance for the past several weeks as well as occasional incontinence of her bowel. Cranial CT scan as well as CT cervical spine negative for acute changes. There was moderate periventricular, subcortical and deep white matter small vessel disease identified on cranial CT. Patient did not receive TPA. CTA of head and neck with no significant stenosis or large vessel occlusion.MRI of the brain 04/24/2016 showed no acute findings or changes compared to February 2017. Microhemorrhages in the cortex congestive of possible amyloid angiopathy. Carotid Doppler showed no ICA stenosis.EEG showed no seizure activity. She remained on Keppra for seizure prophylaxis. Echocardiogram pending. Neurology consulted suspect right brain infarct with workup currently ongoing maintained on aspirin for CVA prophylaxis as well as subcutaneous Lovenox for DVT prophylaxis. Tolerating a regular consistency diet. Physical therapy evaluation completed with recommendations of physical medicine rehabilitation consult.Patient was admitted for a comprehensive rehabilitation program  Review of Systems  Constitutional: Negative for chills and fever.  HENT: Negative for hearing loss and tinnitus.   Eyes: Positive for blurred vision. Negative for double vision.  Respiratory: Negative for cough, shortness of breath and wheezing.   Cardiovascular: Negative for chest pain, palpitations and leg swelling.  Gastrointestinal: Negative for constipation.       Occassional bowel incontinence    Genitourinary: Negative for dysuria, flank pain and hematuria.  Musculoskeletal: Positive for joint pain and myalgias.  Skin: Negative for itching and rash.  Neurological: Positive for dizziness and weakness. Negative for seizures.  All other systems reviewed and are negative.  Past Medical History:  Diagnosis Date  . ABSCESS 12/18/2009  . DIABETES MELLITUS, TYPE II 05/22/2009  . Diverticulosis   . Hemorrhoids   . HYPERGLYCEMIA 05/22/2009  . HYPERTENSION 04/13/2007  . HYPOTHYROIDISM 04/13/2007  . NEOPLASM, SKIN, UNCERTAIN BEHAVIOR [id number]  . Tubular adenoma of colon 02/2001   Past Surgical History:  Procedure Laterality Date  . ABDOMINAL HYSTERECTOMY    . APPENDECTOMY    . CHOLECYSTECTOMY    . COLONOSCOPY  04-05-04   per Dr. Deatra Ina, no polyps   . OOPHORECTOMY    . THYROIDECTOMY     secondary to nodule, no cancer  . TONSILLECTOMY AND ADENOIDECTOMY     Family History  Problem Relation Age of Onset  . Stroke Father   . Hypertension Mother   . Huntington's disease Daughter 27  . Stroke Sister   . Colon cancer Neg Hx    Social History:  reports that she has never smoked. She has never used smokeless tobacco. She reports that she does not drink alcohol or use drugs. Allergies:  Allergies  Allergen Reactions  . Piroxicam Other (See Comments)    REACTION: "SEVERE ALLERGY REACTION"  . Ativan [Lorazepam] Other (See Comments)    Caused delirium, confusion, disorientation, slightly increased agitation.  . Diclofenac Sodium Nausea And Vomiting  . Latex Rash    ?  Marland Kitchen Tape Rash    ?   Medications Prior to Admission  Medication Sig Dispense Refill  . amLODipine (NORVASC) 5 MG tablet TAKE ONE TABLET BY MOUTH ONCE  DAILY (Patient not taking: Reported on 04/23/2016) 90 tablet 3  . [START ON 04/26/2016] aspirin 325 MG tablet Take 1 tablet (325 mg total) by mouth daily.    Marland Kitchen atorvastatin (LIPITOR) 40 MG tablet Take 1 tablet (40 mg total) by mouth daily.    Marland Kitchen glipiZIDE (GLUCOTROL)  10 MG tablet Take 10 mg by mouth 2 (two) times daily before a meal.    . insulin aspart (NOVOLOG) 100 UNIT/ML injection 0-15 Units, Subcutaneous, 3 times daily with meals CBG < 70: implement hypoglycemia protocol CBG 70 - 120: 0 units CBG 121 - 150: 2 units CBG 151 - 200: 3 units CBG 201 - 250: 5 units CBG 251 - 300: 8 units CBG 301 - 350: 11 units CBG 351 - 400: 15 units CBG > 400: call MD 10 mL 11  . [START ON 04/26/2016] levETIRAcetam (KEPPRA XR) 500 MG 24 hr tablet Take 1 tablet (500 mg total) by mouth daily.    Marland Kitchen levothyroxine (SYNTHROID) 175 MCG tablet Take 1 tablet (175 mcg total) by mouth daily before breakfast. 30 tablet 11  . metFORMIN (GLUCOPHAGE) 1000 MG tablet Take 1 tablet (1,000 mg total) by mouth 2 (two) times daily with a meal. 180 tablet 3  . omeprazole (PRILOSEC) 40 MG capsule Take 1 capsule (40 mg total) by mouth daily. (Patient taking differently: Take 40 mg by mouth daily as needed (heartburn). ) 30 capsule 11  . sertraline (ZOLOFT) 100 MG tablet Take 1 tablet (100 mg total) by mouth daily. 30 tablet 5    Home: Home Living Family/patient expects to be discharged to:: Private residence Living Arrangements: Alone Available Help at Discharge: Family, Friend(s) Type of Home: House Home Access: Stairs to enter Technical brewer of Steps: 1 Home Layout: One level Bathroom Shower/Tub: Multimedia programmer: Standard Home Equipment: Environmental consultant - 2 wheels  Lives With: Alone   Functional History: Prior Function Level of Independence: Independent Comments: family reports pt has had several falls in past year and has had concerns about her safety; pt drove and did her own shopping PTA, son stating she was only supposed to drive short distances  Functional Status:  Mobility: Bed Mobility Overal bed mobility: +2 for physical assistance, Needs Assistance Bed Mobility: Supine to Sit, Sit to Supine Supine to sit: +2 for physical assistance, Mod assist Sit to  supine: +2 for physical assistance, Max assist General bed mobility comments: very inefficient movement, requiring assist for trunk and LEs Transfers Overall transfer level: Needs assistance Transfers: Sit to/from Stand Sit to Stand: +2 physical assistance, Mod assist General transfer comment: pt stood from bed x 20 seconds, assist to rise and steady and to place L hand on walker, pt with nausea, requiring return to sitting       ADL: ADL Overall ADL's : Needs assistance/impaired Eating/Feeding: Minimal assistance, Bed level Grooming: Wash/dry face, Sitting, Min guard Upper Body Bathing: Maximal assistance, Sitting Lower Body Bathing: +2 for physical assistance, Total assistance, Sit to/from stand Upper Body Dressing : Moderate assistance, Sitting Lower Body Dressing: +2 for physical assistance, Total assistance, Sit to/from stand Overall ADL's : Needs assistance/impaired Eating/Feeding: Minimal assistance;Bed level   Grooming: Wash/dry face;Sitting;Min guard   Upper Body Bathing: Maximal assistance;Sitting   Lower Body Bathing: +2 for physical assistance;Total assistance;Sit to/from stand   Upper Body Dressing : Moderate assistance;Sitting   Lower Body Dressing: +2 for physical assistance;Total assistance;Sit to/from stand  Cognition: Cognition Overall Cognitive Status: Impaired/Different from baseline Arousal/Alertness: Lethargic Orientation Level: Oriented X4  Attention: Sustained Sustained Attention: Impaired Sustained Attention Impairment: Verbal basic, Functional basic Memory: Impaired Memory Impairment: Decreased recall of new information Awareness: Impaired Awareness Impairment: Intellectual impairment Problem Solving: Appears intact (for basic information unrelated to deficits) Safety/Judgment: Impaired Comments: decreased safety awareness related to deficits Cognition Arousal/Alertness: Awake/alert Behavior During Therapy: WFL for tasks  assessed/performed Overall Cognitive Status: Impaired/Different from baseline Area of Impairment: Attention, Memory, Following commands, Safety/judgement, Awareness Orientation Level: Situation, Place Current Attention Level: Focused (highly distractable) Memory: Decreased short-term memory Following Commands: Follows one step commands with increased time (and multimodal cues) Safety/Judgement: Decreased awareness of safety, Decreased awareness of deficits Awareness:  (pre-intellectual) General Comments: Per family discussion, pt with poor awareness of deficits prior to this incident  Physical Exam: Blood pressure (!) 147/73, pulse 72, temperature 98.3 F (36.8 C), temperature source Oral, resp. rate 18, height 5\' 6"  (1.676 m), weight 74.8 kg (165 lb), SpO2 94 %. Physical Exam  Constitutional: She appears well-developed and well-nourished.  HENT:  Head: Normocephalic.  Right Ear: External ear normal.  Left Ear: External ear normal.  Eyes: EOM are normal. Pupils are equal, round, and reactive to light.  Pupils round and reactive to light  Neck: Normal range of motion. Neck supple. No tracheal deviation present. No thyromegaly present.  Cardiovascular: Normal rate, regular rhythm and normal heart sounds.   Respiratory: Effort normal and breath sounds normal. No respiratory distress. She has no wheezes.  GI: Soft. Bowel sounds are normal. She exhibits no distension. There is no tenderness. There is no rebound.  Musculoskeletal: She exhibits no edema.  Neurological: She is alert.  Alert. Oriented to self, reason she's here, place. Had difficulties with date. Still with inattention to left but improving. Can see to all 4 quadrants, but more limited in the LLQ. Remains somewhat Apraxic. Seems to sense pain in all 4. Motor 4/5 to 5/5 in all 4 limbs proximal to distal. .senses pain in all 4 limbs. DTR's are 1+.  Skin: Skin is warm and dry.  Psychiatric: pleasnt and cooperative  Results for  orders placed or performed during the hospital encounter of 04/22/16 (from the past 48 hour(s))  Glucose, capillary     Status: Abnormal   Collection Time: 04/23/16 10:09 PM  Result Value Ref Range   Glucose-Capillary 158 (H) 65 - 99 mg/dL   Comment 1 Notify RN    Comment 2 Document in Chart   Glucose, capillary     Status: Abnormal   Collection Time: 04/24/16  6:08 AM  Result Value Ref Range   Glucose-Capillary 130 (H) 65 - 99 mg/dL   Comment 1 Notify RN    Comment 2 Document in Chart   Glucose, capillary     Status: Abnormal   Collection Time: 04/24/16  4:41 PM  Result Value Ref Range   Glucose-Capillary 163 (H) 65 - 99 mg/dL   Comment 1 Notify RN    Comment 2 Document in Chart   Glucose, capillary     Status: Abnormal   Collection Time: 04/24/16  9:31 PM  Result Value Ref Range   Glucose-Capillary 158 (H) 65 - 99 mg/dL   Comment 1 Notify RN    Comment 2 Document in Chart   Glucose, capillary     Status: Abnormal   Collection Time: 04/25/16  6:27 AM  Result Value Ref Range   Glucose-Capillary 153 (H) 65 - 99 mg/dL   Comment 1 Notify RN    Comment 2 Document in Chart   Glucose, capillary  Status: Abnormal   Collection Time: 04/25/16 11:13 AM  Result Value Ref Range   Glucose-Capillary 181 (H) 65 - 99 mg/dL   Comment 1 Notify RN    Comment 2 Document in Chart   Glucose, capillary     Status: Abnormal   Collection Time: 04/25/16  4:55 PM  Result Value Ref Range   Glucose-Capillary 130 (H) 65 - 99 mg/dL   Comment 1 Notify RN    Comment 2 Document in Chart    Ct Angio Head W Or Wo Contrast  Result Date: 04/23/2016 CLINICAL DATA:  Stroke, follow-up evaluation. Found on floor. Recent gait imbalance. Clinical concern for acute RIGHT PCA territory infarct. EXAM: CT ANGIOGRAPHY HEAD AND NECK TECHNIQUE: Multidetector CT imaging of the head and neck was performed using the standard protocol during bolus administration of intravenous contrast. Multiplanar CT image  reconstructions and MIPs were obtained to evaluate the vascular anatomy. Carotid stenosis measurements (when applicable) are obtained utilizing NASCET criteria, using the distal internal carotid diameter as the denominator. CONTRAST:  50 cc Isovue 370 COMPARISON:  CT HEAD April 22, 2016 FINDINGS: CTA NECK AORTIC ARCH: Normal appearance of the thoracic arch, 2 vessel arch. Moderate intimal thickening calcific atherosclerosis of the aortic arch and LEFT subclavian artery origin. The origins of the innominate, left Common carotid artery and subclavian artery are widely patent. RIGHT CAROTID SYSTEM: Common carotid artery is widely patent, coursing in a straight line fashion. Normal appearance of the carotid bifurcation without hemodynamically significant stenosis by NASCET criteria. Mild eccentric calcific atherosclerosis. Normal appearance of the included internal carotid artery. LEFT CAROTID SYSTEM: Common carotid artery is widely patent, coursing in a straight line fashion. Mild eccentric intimal thickening. Normal appearance of the carotid bifurcation without hemodynamically significant stenosis by NASCET criteria. Mild to moderate eccentric calcific atherosclerosis. Normal appearance of the included internal carotid artery. VERTEBRAL ARTERIES:Left vertebral artery is dominant. Normal appearance of the vertebral arteries, which appear widely patent. SKELETON: No acute osseous process though bone windows have not been submitted. Moderate C5-6 and C6-7 degenerative discs. OTHER NECK: Soft tissues of the neck are non-acute though, not tailored for evaluation. RIGHT thyroidectomy. Enlarged heterogeneous LEFT thyroid without dominant nodule. CTA HEAD ANTERIOR CIRCULATION: Normal appearance of the cervical internal carotid arteries, petrous, cavernous and supra clinoid internal carotid arteries. 2 mm medially directed intact aneurysm LEFT supraclinoid internal carotid artery versus posterior communicating artery  infundibulum. Widely patent anterior communicating artery. Patent anterior and middle cerebral arteries with mild luminal regularity. No large vessel occlusion, hemodynamically significant stenosis, dissection, luminal irregularity, contrast extravasation or aneurysm. POSTERIOR CIRCULATION: Normal appearance of the vertebral arteries, vertebrobasilar junction and basilar artery, as well as main branch vessels. Patent posterior cerebral arteries. Moderate tandem stenoses bilateral posterior cerebral arteries. No large vessel occlusion, dissection, luminal irregularity, contrast extravasation or aneurysm. VENOUS SINUSES: Major dural venous sinuses are patent though not tailored for evaluation on this angiographic examination. ANATOMIC VARIANTS: None. DELAYED PHASE: No abnormal intracranial enhancement. Review of the MIP images confirms the above findings IMPRESSION: CTA NECK: No hemodynamically significant stenosis or acute vascular process. CTA HEAD:  No emergent large vessel occlusion. 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 Moderate luminal irregularity of the bilateral posterior cerebral arteries compatible with atherosclerosis. Electronically Signed   By: Elon Alas M.D.   On: 04/23/2016 19:39   Ct Angio Neck W Or Wo Contrast  Result Date: 04/23/2016 CLINICAL DATA:  Stroke, follow-up evaluation. Found on floor. Recent gait  imbalance. Clinical concern for acute RIGHT PCA territory infarct. EXAM: CT ANGIOGRAPHY HEAD AND NECK TECHNIQUE: Multidetector CT imaging of the head and neck was performed using the standard protocol during bolus administration of intravenous contrast. Multiplanar CT image reconstructions and MIPs were obtained to evaluate the vascular anatomy. Carotid stenosis measurements (when applicable) are obtained utilizing NASCET criteria, using the distal internal carotid diameter as the denominator. CONTRAST:  50 cc  Isovue 370 COMPARISON:  CT HEAD April 22, 2016 FINDINGS: CTA NECK AORTIC ARCH: Normal appearance of the thoracic arch, 2 vessel arch. Moderate intimal thickening calcific atherosclerosis of the aortic arch and LEFT subclavian artery origin. The origins of the innominate, left Common carotid artery and subclavian artery are widely patent. RIGHT CAROTID SYSTEM: Common carotid artery is widely patent, coursing in a straight line fashion. Normal appearance of the carotid bifurcation without hemodynamically significant stenosis by NASCET criteria. Mild eccentric calcific atherosclerosis. Normal appearance of the included internal carotid artery. LEFT CAROTID SYSTEM: Common carotid artery is widely patent, coursing in a straight line fashion. Mild eccentric intimal thickening. Normal appearance of the carotid bifurcation without hemodynamically significant stenosis by NASCET criteria. Mild to moderate eccentric calcific atherosclerosis. Normal appearance of the included internal carotid artery. VERTEBRAL ARTERIES:Left vertebral artery is dominant. Normal appearance of the vertebral arteries, which appear widely patent. SKELETON: No acute osseous process though bone windows have not been submitted. Moderate C5-6 and C6-7 degenerative discs. OTHER NECK: Soft tissues of the neck are non-acute though, not tailored for evaluation. RIGHT thyroidectomy. Enlarged heterogeneous LEFT thyroid without dominant nodule. CTA HEAD ANTERIOR CIRCULATION: Normal appearance of the cervical internal carotid arteries, petrous, cavernous and supra clinoid internal carotid arteries. 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 Widely patent anterior communicating artery. Patent anterior and middle cerebral arteries with mild luminal regularity. No large vessel occlusion, hemodynamically significant stenosis, dissection, luminal irregularity, contrast  extravasation or aneurysm. POSTERIOR CIRCULATION: Normal appearance of the vertebral arteries, vertebrobasilar junction and basilar artery, as well as main branch vessels. Patent posterior cerebral arteries. Moderate tandem stenoses bilateral posterior cerebral arteries. No large vessel occlusion, dissection, luminal irregularity, contrast extravasation or aneurysm. VENOUS SINUSES: Major dural venous sinuses are patent though not tailored for evaluation on this angiographic examination. ANATOMIC VARIANTS: None. DELAYED PHASE: No abnormal intracranial enhancement. Review of the MIP images confirms the above findings IMPRESSION: CTA NECK: No hemodynamically significant stenosis or acute vascular process. CTA HEAD:  No emergent large vessel occlusion. 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 Moderate luminal irregularity of the bilateral posterior cerebral arteries compatible with atherosclerosis. Electronically Signed   By: Elon Alas M.D.   On: 04/23/2016 19:39   Mr Brain Wo Contrast  Result Date: 04/24/2016 CLINICAL DATA:  Stroke.  Found on floor. EXAM: MRI HEAD WITHOUT CONTRAST TECHNIQUE: Multiplanar, multiecho pulse sequences of the brain and surrounding structures were obtained without intravenous contrast. COMPARISON:  07/24/2015 FINDINGS: Brain: No acute infarction, hemorrhage, hydrocephalus, extra-axial collection or mass lesion. Confluent gliosis in the cerebral white matter and patchy in the pons, nonspecific but consistent with chronic microvascular disease given patient's multiple vascular risk factors. Remote small vessel infarct in the peripheral left cerebellum. Chronic hemorrhagic foci were better seen on previous 3 T exam using SMI technique. Hypertensive pattern hemorrhages are seen in the bilateral deep gray nuclei and pons. There are also scattered cortical chronic hemorrhagic foci, with predilection for the posterior  right cerebral hemisphere.  The lobar hemorrhages imply superimposed amyloid angiopathy or previous trauma. Vascular: Preserved flow voids Skull and upper cervical spine: Negative Sinuses/Orbits: Bilateral cataract resection. IMPRESSION: 1. No acute finding or change from February 2017. 2. Advanced chronic microvascular disease and remote hypertensive micro hemorrhages. 3. Multiple remote cortical hemorrhages, possible superimposed amyloid angiopathy. Electronically Signed   By: Monte Fantasia M.D.   On: 04/24/2016 12:50       Medical Problem List and Plan: 1.  Decreased functional mobility with balance deficits processing deficits as well as apraxia secondary to small right cortical brain infarct/?seizure  -admit to inpatient rehab 2.  DVT Prophylaxis/Anticoagulation: Subcutaneous Lovenox. Monitor platelet counts and any signs of bleeding 3. Pain Management: Tylenol as needed 4. Mood: Zoloft 100mg  daily 5. Neuropsych: This patient is capable of making decisions on her own behalf. 6. Skin/Wound Care: Routine skin checks 7. Fluids/Electrolytes/Nutrition: Routine I&O with follow-up chemistries during admit 8. Seizure prophylaxis. Keppra-XR 500 mg daily 9.. Diabetes mellitus with peripheral neuropathy. Hemoglobin A1c 7.1. Patient on Glucotrol 10 mg twice a day, Glucophage 1000 mg twice a day prior to admission. Resume as needed 11. Hypertension. No current antihypertensive medication. Patient on Norvasc 5 mg daily prior to admission. Resume as needed 12. Hypothyroidism. Synthroid 13. Hyperlipidemia. Lipitor  Post Admission Physician Evaluation: 1. Functional deficits secondary  to small right cortical infarct/?seizure. 2. Patient is admitted to receive collaborative, interdisciplinary care between the physiatrist, rehab nursing staff, and therapy team. 3. Patient's level of medical complexity and substantial therapy needs in context of that medical necessity cannot be provided at a lesser  intensity of care such as a SNF. 4. Patient has experienced substantial functional loss from his/her baseline which was documented above under the "Functional History" and "Functional Status" headings.  Judging by the patient's diagnosis, physical exam, and functional history, the patient has potential for functional progress which will result in measurable gains while on inpatient rehab.  These gains will be of substantial and practical use upon discharge  in facilitating mobility and self-care at the household level. 5. Physiatrist will provide 24 hour management of medical needs as well as oversight of the therapy plan/treatment and provide guidance as appropriate regarding the interaction of the two. 6. The Preadmission Screening has been reviewed and patient status is unchanged unless otherwise stated above. 7. 24 hour rehab nursing will assist with bladder management, bowel management, safety, skin/wound care, disease management, medication administration and patient education  and help integrate therapy concepts, techniques,education, etc. 8. PT will assess and treat for/with: Lower extremity strength, range of motion, stamina, balance, functional mobility, safety, adaptive techniques and equipment, NMR, visual-spatial awareness, family education.   Goals are: mod I to supervision. 9. OT will assess and treat for/with: ADL's, functional mobility, safety, upper extremity strength, adaptive techniques and equipment, NMR, visual-spatial awareness, family education, community reintegration.   Goals are: mod I to supervision. Therapy may proceed with showering this patient. 10. SLP will assess and treat for/with: cognition, family education.  Goals are: mod I to supervision. 11. Case Management and Social Worker will assess and treat for psychological issues and discharge planning. 12. Team conference will be held weekly to assess progress toward goals and to determine barriers to discharge. 13. Patient  will receive at least 3 hours of therapy per day at least 5 days per week. 14. ELOS: 11-17 days      15. Prognosis:  excellent     Meredith Staggers, MD, [place]  04/25/2016  04/25/2016 

## 2016-04-24 NOTE — Progress Notes (Signed)
PROGRESS NOTE        PATIENT DETAILS Name: Amy [name] Age: 82 y.o. Sex: female Date of Birth: 06-27-1929 Admit Date: 04/22/2016 Admitting Physician Etta Quill, DO PCP:FRY,STEPHEN A, MD  Brief Narrative: Patient is a 82 y.o. female with past medical history of type 2 diabetes, hypertension, dyslipidemia, presented to the hospital with left-sided vision loss and confusion. Thought to have acute CVA, see below for further details.  Subjective: Much better this morning-much more awake and alert. Speech much more fluent although still still slightly slow. Hardly any left hemi-neglect today. Left upper and left lower extremity weakness have also improved.  Assessment/Plan: Probable Acute right brain infarct: Improving left-sided weakness, hardly any left hemi-neglect today. Patient was unable to complete MRI brain is yesterday-given improving deficits-we will reattempt today to make sure this was a CVA and a not seizures. CT angiogram of the head and neck not show any major stenosis. A1c 7.1, LDL 114. For now continue aspirin and statin, await EEG and further workup and further recommendations from neurology.  Hypertension: Allow some permissive hypertension-blood pressure on the higher side-will slowly reinitiate antihypertensives.  Type 2 diabetes: CBGs stable with SSI, oral hypoglycemics remain on hold.  A1c 7.1  Dyslipidemia: LDL 114-not at goal-on 20 mg of Lipitor as outpatient- increase to 40 mg. Repeat LDL in 3 months.  Hypothyroidism: Continue levothyroxine  DVT Prophylaxis: Prophylactic Lovenox  Code Status:  DNR  Family Communication: Son at bedside  Disposition Plan: Remain inpatient-?CIR on discharge  Antimicrobial agents: None  Procedures: None  CONSULTS:  neurology  Time spent: 25 minutes-Greater than 50% of this time was spent in counseling, explanation of diagnosis, planning of further management, and coordination of  care.  MEDICATIONS: Anti-infectives    None      Scheduled Meds: . aspirin  300 mg Rectal Daily   Or  . aspirin  325 mg Oral Daily  . atorvastatin  40 mg Oral Daily  . enoxaparin (LOVENOX) injection  40 mg Subcutaneous QHS  . insulin aspart  0-15 Units Subcutaneous TID WC  . levothyroxine  175 mcg Oral QAC breakfast  . pantoprazole  80 mg Oral Daily  . sertraline  100 mg Oral Daily   Continuous Infusions: PRN Meds:.acetaminophen, LORazepam, ondansetron (ZOFRAN) IV   PHYSICAL EXAM: Vital signs: Vitals:   04/23/16 2141 04/24/16 0209 04/24/16 0522 04/24/16 0916  BP: (!) 170/90 (!) 146/72 (!) 170/76 (!) 172/74  Pulse: 87 67 66 64  Resp: 20 20 20 20   Temp: 98.8 F (37.1 C) 97.9 F (36.6 C) 97.7 F (36.5 C) 99 F (37.2 C)  TempSrc: Oral Oral Axillary Oral  SpO2: 92% 98% 93% 94%  Weight:      Height:       Filed Weights   04/22/16 1811  Weight: 74.8 kg (165 lb)   Body mass index is 26.63 kg/m.   General appearance :Much more awake and alert. Able to now track movement in her left visual field area. Beats slow but mostly clear.  Eyes:, pupils equally reactive to light and accomodation,no scleral icterus.Pink conjunctiva HEENT: Atraumatic and Normocephalic Neck: supple, no JVD. No cervical lymphadenopathy. No thyromegaly Resp:Good air entry bilaterally, no added sounds  CVS: S1 S2 regular GI: Bowel sounds present, Non tender and not distended with no gaurding, rigidity or rebound.No organomegaly Extremities: B/L Lower Ext shows  no edema, both legs are warm to touch Neurology: Left-sided weakness-approximately 4+/5. Marland Kitchen Musculoskeletal:No digital cyanosis Skin:No Rash, warm and dry Wounds:N/A  I have personally reviewed following labs and imaging studies  LABORATORY DATA: CBC:  Recent Labs Lab 04/22/16 1928 04/22/16 2100  WBC  --  9.9  NEUTROABS  --  7.0  HGB 12.2 12.2  HCT 36.0 35.3*  MCV  --  90.1  PLT  --  [id number]    Basic Metabolic Panel:  Recent  Labs Lab 04/22/16 1928 04/22/16 1932  NA 139 137  K 3.5 3.5  CL 98* 101  CO2  --  27  GLUCOSE 131* 130*  BUN 14 12  CREATININE 0.80 0.91  CALCIUM  --  9.4    GFR: Estimated Creatinine Clearance: 47.6 mL/min (by C-G formula based on SCr of 0.91 mg/dL).  Liver Function Tests:  Recent Labs Lab 04/22/16 1932  AST 22  ALT 17  ALKPHOS 73  BILITOT 1.8*  PROT 7.1  ALBUMIN 3.8   No results for input(s): LIPASE, AMYLASE in the last 168 hours. No results for input(s): AMMONIA in the last 168 hours.  Coagulation Profile: No results for input(s): INR, PROTIME in the last 168 hours.  Cardiac Enzymes:  Recent Labs Lab 04/22/16 1932  CKTOTAL 155    BNP (last 3 results) No results for input(s): PROBNP in the last 8760 hours.  HbA1C:  Recent Labs  04/23/16 0435  HGBA1C 7.1*    CBG:  Recent Labs Lab 04/23/16 0612 04/23/16 1124 04/23/16 1605 04/23/16 2209 04/24/16 0608  GLUCAP 123* 145* 133* 158* 130*    Lipid Profile:  Recent Labs  04/23/16 0435  CHOL 206*  HDL 65  LDLCALC 114*  TRIG 135  CHOLHDL 3.2    Thyroid Function Tests: No results for input(s): TSH, T4TOTAL, FREET4, T3FREE, THYROIDAB in the last 72 hours.  Anemia Panel: No results for input(s): VITAMINB12, FOLATE, FERRITIN, TIBC, IRON, RETICCTPCT in the last 72 hours.  Urine analysis:    Component Value Date/Time   COLORURINE YELLOW 04/22/2016 1911   APPEARANCEUR CLEAR 04/22/2016 1911   LABSPEC 1.018 04/22/2016 1911   PHURINE 6.0 04/22/2016 1911   GLUCOSEU NEGATIVE 04/22/2016 1911   HGBUR NEGATIVE 04/22/2016 1911   HGBUR negative 12/18/2008 1559   BILIRUBINUR NEGATIVE 04/22/2016 1911   BILIRUBINUR n 12/11/2014 1417   KETONESUR 40 (A) 04/22/2016 1911   PROTEINUR 30 (A) 04/22/2016 1911   UROBILINOGEN 0.2 12/11/2014 1417   UROBILINOGEN 0.2 01/16/2013 1358   NITRITE NEGATIVE 04/22/2016 1911   LEUKOCYTESUR NEGATIVE 04/22/2016 1911    Sepsis Labs: Lactic Acid, Venous No results  found for: LATICACIDVEN  MICROBIOLOGY: Recent Results (from the past 240 hour(s))  Urine culture     Status: Abnormal   Collection Time: 04/22/16  7:11 PM  Result Value Ref Range Status   Specimen Description URINE, RANDOM  Final   Special Requests NONE  Final   Culture MULTIPLE SPECIES PRESENT, SUGGEST RECOLLECTION (A)  Final   Report Status 04/24/2016 FINAL  Final    RADIOLOGY STUDIES/RESULTS: Ct Angio Head W Or Wo Contrast  Result Date: 04/23/2016 CLINICAL DATA:  Stroke, follow-up evaluation. Found on floor. Recent gait imbalance. Clinical concern for acute RIGHT PCA territory infarct. EXAM: CT ANGIOGRAPHY HEAD AND NECK TECHNIQUE: Multidetector CT imaging of the head and neck was performed using the standard protocol during bolus administration of intravenous contrast. Multiplanar CT image reconstructions and MIPs were obtained to evaluate the vascular anatomy. Carotid stenosis measurements (when  applicable) are obtained utilizing NASCET criteria, using the distal internal carotid diameter as the denominator. CONTRAST:  50 cc Isovue 370 COMPARISON:  CT HEAD April 22, 2016 FINDINGS: CTA NECK AORTIC ARCH: Normal appearance of the thoracic arch, 2 vessel arch. Moderate intimal thickening calcific atherosclerosis of the aortic arch and LEFT subclavian artery origin. The origins of the innominate, left Common carotid artery and subclavian artery are widely patent. RIGHT CAROTID SYSTEM: Common carotid artery is widely patent, coursing in a straight line fashion. Normal appearance of the carotid bifurcation without hemodynamically significant stenosis by NASCET criteria. Mild eccentric calcific atherosclerosis. Normal appearance of the included internal carotid artery. LEFT CAROTID SYSTEM: Common carotid artery is widely patent, coursing in a straight line fashion. Mild eccentric intimal thickening. Normal appearance of the carotid bifurcation without hemodynamically significant stenosis by NASCET  criteria. Mild to moderate eccentric calcific atherosclerosis. Normal appearance of the included internal carotid artery. VERTEBRAL ARTERIES:Left vertebral artery is dominant. Normal appearance of the vertebral arteries, which appear widely patent. SKELETON: No acute osseous process though bone windows have not been submitted. Moderate C5-6 and C6-7 degenerative discs. OTHER NECK: Soft tissues of the neck are non-acute though, not tailored for evaluation. RIGHT thyroidectomy. Enlarged heterogeneous LEFT thyroid without dominant nodule. CTA HEAD ANTERIOR CIRCULATION: Normal appearance of the cervical internal carotid arteries, petrous, cavernous and supra clinoid internal carotid arteries. 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 Widely patent anterior communicating artery. Patent anterior and middle cerebral arteries with mild luminal regularity. No large vessel occlusion, hemodynamically significant stenosis, dissection, luminal irregularity, contrast extravasation or aneurysm. POSTERIOR CIRCULATION: Normal appearance of the vertebral arteries, vertebrobasilar junction and basilar artery, as well as main branch vessels. Patent posterior cerebral arteries. Moderate tandem stenoses bilateral posterior cerebral arteries. No large vessel occlusion, dissection, luminal irregularity, contrast extravasation or aneurysm. VENOUS SINUSES: Major dural venous sinuses are patent though not tailored for evaluation on this angiographic examination. ANATOMIC VARIANTS: None. DELAYED PHASE: No abnormal intracranial enhancement. Review of the MIP images confirms the above findings IMPRESSION: CTA NECK: No hemodynamically significant stenosis or acute vascular process. CTA HEAD:  No emergent large vessel occlusion. 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 Moderate luminal  irregularity of the bilateral posterior cerebral arteries compatible with atherosclerosis. Electronically Signed   By: Elon Alas M.D.   On: 04/23/2016 19:39   Dg Chest 1 View  Result Date: 04/22/2016 CLINICAL DATA:  Confusion, severe headache today. Fell yesterday. Found underneath bed today. EXAM: CHEST 1 VIEW COMPARISON:  Chest radiograph June 29, 2006 FINDINGS: Cardiac silhouette is mildly enlarged. Mildly tortuous and atherosclerotic aorta. Strandy densities LEFT lung. No pleural effusion or focal consolidation. No pneumothorax. Osteopenia. Soft tissue planes are unremarkable. IMPRESSION: Mild cardiomegaly.  Similar LEFT lung scarring. Electronically Signed   By: Elon Alas M.D.   On: 04/22/2016 20:35   Ct Head Wo Contrast  Result Date: 04/22/2016 CLINICAL DATA:  Found on floor, previous fall with head injury EXAM: CT HEAD WITHOUT CONTRAST CT CERVICAL SPINE WITHOUT CONTRAST TECHNIQUE: Multidetector CT imaging of the head and cervical spine was performed following the standard protocol without intravenous contrast. Multiplanar CT image reconstructions of the cervical spine were also generated. COMPARISON:  MRI 07/24/2015 FINDINGS: CT HEAD FINDINGS Brain: No acute territorial infarction, intracranial hemorrhage or extra-axial fluid collection is seen. Mild atrophy. Moderate periventricular, subcortical and deep white matter hypodensities consistent with small vessel disease. Decreased density in  the pons, felt to correlate with abnormal small vessel changes on prior MRI. Heterogenous density in the bilateral thalamus, also likely relate to mild signal abnormality noted on T2 images. Vascular: No hyperdense vessels. There are carotid artery calcifications. There are vertebral artery calcifications. Skull: Mastoid air cells are clear.  There is no skull fracture Sinuses/Orbits: Mild mucosal thickening in the ethmoid sinuses. No acute orbital abnormality. Other: None CT CERVICAL SPINE  FINDINGS Alignment: No subluxation.  Facet alignment is maintained. Skull base and vertebrae: Craniovertebral junction appears intact. Vertebral body heights are maintained. There is no fracture identified. Soft tissues and spinal canal: No prevertebral fluid or swelling. No visible canal hematoma. Disc levels: Moderate degenerative disc changes at C5-C6 and C6-C7 with endplate changes and osteophytes. Multilevel facet arthropathy. No significant foraminal stenosis. Upper chest: Lung apices clear. Enlarged left lobe of thyroid gland. Other: Carotid artery calcifications. IMPRESSION: 1. No definite CT evidence for acute intracranial abnormality. Moderate periventricular, subcortical and deep white matter small vessel disease. 2. No acute fracture or malalignment of the cervical spine. Moderate degenerative disc changes most notable at C5-C6 and C6-C7. Electronically Signed   By: Donavan Foil M.D.   On: 04/22/2016 20:29   Ct Angio Neck W Or Wo Contrast  Result Date: 04/23/2016 CLINICAL DATA:  Stroke, follow-up evaluation. Found on floor. Recent gait imbalance. Clinical concern for acute RIGHT PCA territory infarct. EXAM: CT ANGIOGRAPHY HEAD AND NECK TECHNIQUE: Multidetector CT imaging of the head and neck was performed using the standard protocol during bolus administration of intravenous contrast. Multiplanar CT image reconstructions and MIPs were obtained to evaluate the vascular anatomy. Carotid stenosis measurements (when applicable) are obtained utilizing NASCET criteria, using the distal internal carotid diameter as the denominator. CONTRAST:  50 cc Isovue 370 COMPARISON:  CT HEAD April 22, 2016 FINDINGS: CTA NECK AORTIC ARCH: Normal appearance of the thoracic arch, 2 vessel arch. Moderate intimal thickening calcific atherosclerosis of the aortic arch and LEFT subclavian artery origin. The origins of the innominate, left Common carotid artery and subclavian artery are widely patent. RIGHT CAROTID  SYSTEM: Common carotid artery is widely patent, coursing in a straight line fashion. Normal appearance of the carotid bifurcation without hemodynamically significant stenosis by NASCET criteria. Mild eccentric calcific atherosclerosis. Normal appearance of the included internal carotid artery. LEFT CAROTID SYSTEM: Common carotid artery is widely patent, coursing in a straight line fashion. Mild eccentric intimal thickening. Normal appearance of the carotid bifurcation without hemodynamically significant stenosis by NASCET criteria. Mild to moderate eccentric calcific atherosclerosis. Normal appearance of the included internal carotid artery. VERTEBRAL ARTERIES:Left vertebral artery is dominant. Normal appearance of the vertebral arteries, which appear widely patent. SKELETON: No acute osseous process though bone windows have not been submitted. Moderate C5-6 and C6-7 degenerative discs. OTHER NECK: Soft tissues of the neck are non-acute though, not tailored for evaluation. RIGHT thyroidectomy. Enlarged heterogeneous LEFT thyroid without dominant nodule. CTA HEAD ANTERIOR CIRCULATION: Normal appearance of the cervical internal carotid arteries, petrous, cavernous and supra clinoid internal carotid arteries. 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 Widely patent anterior communicating artery. Patent anterior and middle cerebral arteries with mild luminal regularity. No large vessel occlusion, hemodynamically significant stenosis, dissection, luminal irregularity, contrast extravasation or aneurysm. POSTERIOR CIRCULATION: Normal appearance of the vertebral arteries, vertebrobasilar junction and basilar artery, as well as main branch vessels. Patent posterior cerebral arteries. Moderate tandem stenoses bilateral posterior cerebral arteries. No large vessel occlusion, dissection, luminal irregularity,  contrast extravasation or  aneurysm. VENOUS SINUSES: Major dural venous sinuses are patent though not tailored for evaluation on this angiographic examination. ANATOMIC VARIANTS: None. DELAYED PHASE: No abnormal intracranial enhancement. Review of the MIP images confirms the above findings IMPRESSION: CTA NECK: No hemodynamically significant stenosis or acute vascular process. CTA HEAD:  No emergent large vessel occlusion. 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 Moderate luminal irregularity of the bilateral posterior cerebral arteries compatible with atherosclerosis. Electronically Signed   By: Elon Alas M.D.   On: 04/23/2016 19:39   Ct Cervical Spine Wo Contrast  Result Date: 04/22/2016 CLINICAL DATA:  Found on floor, previous fall with head injury EXAM: CT HEAD WITHOUT CONTRAST CT CERVICAL SPINE WITHOUT CONTRAST TECHNIQUE: Multidetector CT imaging of the head and cervical spine was performed following the standard protocol without intravenous contrast. Multiplanar CT image reconstructions of the cervical spine were also generated. COMPARISON:  MRI 07/24/2015 FINDINGS: CT HEAD FINDINGS Brain: No acute territorial infarction, intracranial hemorrhage or extra-axial fluid collection is seen. Mild atrophy. Moderate periventricular, subcortical and deep white matter hypodensities consistent with small vessel disease. Decreased density in the pons, felt to correlate with abnormal small vessel changes on prior MRI. Heterogenous density in the bilateral thalamus, also likely relate to mild signal abnormality noted on T2 images. Vascular: No hyperdense vessels. There are carotid artery calcifications. There are vertebral artery calcifications. Skull: Mastoid air cells are clear.  There is no skull fracture Sinuses/Orbits: Mild mucosal thickening in the ethmoid sinuses. No acute orbital abnormality. Other: None CT CERVICAL SPINE FINDINGS Alignment: No subluxation.  Facet  alignment is maintained. Skull base and vertebrae: Craniovertebral junction appears intact. Vertebral body heights are maintained. There is no fracture identified. Soft tissues and spinal canal: No prevertebral fluid or swelling. No visible canal hematoma. Disc levels: Moderate degenerative disc changes at C5-C6 and C6-C7 with endplate changes and osteophytes. Multilevel facet arthropathy. No significant foraminal stenosis. Upper chest: Lung apices clear. Enlarged left lobe of thyroid gland. Other: Carotid artery calcifications. IMPRESSION: 1. No definite CT evidence for acute intracranial abnormality. Moderate periventricular, subcortical and deep white matter small vessel disease. 2. No acute fracture or malalignment of the cervical spine. Moderate degenerative disc changes most notable at C5-C6 and C6-C7. Electronically Signed   By: Donavan Foil M.D.   On: 04/22/2016 20:29     LOS: 2 days   Oren Binet, MD  [facility] Pager:[phone]  If 7PM-7AM, please contact night-coverage [email or web address] Password [email or web address] 04/24/2016, 11:16 AM

## 2016-04-24 NOTE — Consult Note (Signed)
Physical Medicine and Rehabilitation Consult Reason for Consult: Suspect right brain infarct Referring Physician: I   HPI: Amy [name] is a 82 y.o. right handed female with history of diabetes mellitus, hypertension. Per chart review patient lives alone and was independent and still driving short distances prior to admission. Presented 04/22/2016 with altered mental status as well as decreased vision on the left. Daughter had reported she had difficulty with her balance for the past several weeks as well as occasional incontinence of her bowel. Cranial CT scan as well as CT cervical spine negative for acute changes. There was moderate periventricular, subcortical and deep white matter small vessel disease identified on cranial CT. Patient did not receive TPA. CTA of head and neck with no significant stenosis or large vessel occlusion. Carotid Doppler showed no ICA stenosis. Echocardiogram pending. Neurology consulted suspect right brain infarct with workup currently ongoing maintained on aspirin for CVA prophylaxis as well as subcutaneous Lovenox for DVT prophylaxis. Tolerating a regular consistency diet. Physical therapy evaluation completed with recommendations of physical medicine rehabilitation consult.   Review of Systems  Constitutional: Negative for chills and fever.  HENT: Negative for hearing loss and tinnitus.   Eyes: Negative for blurred vision and double vision.  Respiratory: Negative for shortness of breath.   Cardiovascular: Negative for chest pain, palpitations and leg swelling.  Gastrointestinal: Negative for nausea and vomiting.  Genitourinary: Positive for frequency and urgency. Negative for dysuria and flank pain.       Occasional bowel incontinence  Musculoskeletal: Positive for myalgias. Negative for falls.  Skin: Negative for rash.  Neurological: Positive for dizziness and weakness. Negative for seizures and loss of consciousness.  Psychiatric/Behavioral:  Positive for depression.  All other systems reviewed and are negative.  Past Medical History:  Diagnosis Date  . ABSCESS 12/18/2009  . DIABETES MELLITUS, TYPE II 05/22/2009  . Diverticulosis   . Hemorrhoids   . HYPERGLYCEMIA 05/22/2009  . HYPERTENSION 04/13/2007  . HYPOTHYROIDISM 04/13/2007  . NEOPLASM, SKIN, UNCERTAIN BEHAVIOR [id number]  . Tubular adenoma of colon 02/2001   Past Surgical History:  Procedure Laterality Date  . ABDOMINAL HYSTERECTOMY    . APPENDECTOMY    . CHOLECYSTECTOMY    . COLONOSCOPY  04-05-04   per Dr. Deatra Ina, no polyps   . OOPHORECTOMY    . THYROIDECTOMY     secondary to nodule, no cancer  . TONSILLECTOMY AND ADENOIDECTOMY     Family History  Problem Relation Age of Onset  . Stroke Father   . Hypertension Mother   . Huntington's disease Daughter 62  . Stroke Sister   . Colon cancer Neg Hx    Social History:  reports that she has never smoked. She has never used smokeless tobacco. She reports that she does not drink alcohol or use drugs. Allergies:  Allergies  Allergen Reactions  . Piroxicam Other (See Comments)    REACTION: "SEVERE ALLERGY REACTION"  . Ativan [Lorazepam] Other (See Comments)    Caused delirium, confusion, disorientation, slightly increased agitation.  . Diclofenac Sodium Nausea And Vomiting  . Latex Rash    ?  Marland Kitchen Tape Rash    ?   Medications Prior to Admission  Medication Sig Dispense Refill  . atorvastatin (LIPITOR) 20 MG tablet TAKE ONE TABLET BY MOUTH ONCE DAILY 90 tablet 3  . glipiZIDE (GLUCOTROL) 10 MG tablet Take 10 mg by mouth 2 (two) times daily before a meal.    . levothyroxine (SYNTHROID) 175 MCG tablet Take  1 tablet (175 mcg total) by mouth daily before breakfast. 30 tablet 11  . metFORMIN (GLUCOPHAGE) 1000 MG tablet Take 1 tablet (1,000 mg total) by mouth 2 (two) times daily with a meal. 180 tablet 3  . sertraline (ZOLOFT) 100 MG tablet Take 1 tablet (100 mg total) by mouth daily. 30 tablet 5  . amLODipine  (NORVASC) 5 MG tablet TAKE ONE TABLET BY MOUTH ONCE DAILY (Patient not taking: Reported on 04/23/2016) 90 tablet 3  . aspirin 81 MG tablet Take 81 mg by mouth daily.      . cyclobenzaprine (FLEXERIL) 10 MG tablet Take 1 tablet (10 mg total) by mouth 3 (three) times daily as needed for muscle spasms. (Patient not taking: Reported on 04/23/2016) 90 tablet 5  . ibuprofen (ADVIL,MOTRIN) 200 MG tablet Take 400 mg by mouth every 6 (six) hours as needed for moderate pain.    Marland Kitchen omeprazole (PRILOSEC) 40 MG capsule Take 1 capsule (40 mg total) by mouth daily. (Patient taking differently: Take 40 mg by mouth daily as needed (heartburn). ) 30 capsule 11    Home: Home Living Family/patient expects to be discharged to:: Private residence Living Arrangements: Alone Available Help at Discharge: Family, Friend(s) Type of Home: House Home Access: Stairs to enter Technical brewer of Steps: 1 Home Layout: One level Home Equipment: Environmental consultant - 2 wheels  Lives With: Alone  Functional History: Prior Function Level of Independence: Independent Comments: family reports pt has had several falls in past year and has had concerns about her safety; pt drove and did her own shopping PTA Functional Status:  Mobility: Bed Mobility Overal bed mobility: +2 for physical assistance, Needs Assistance Bed Mobility: Supine to Sit, Sit to Supine Supine to sit: Max assist, +2 for physical assistance Sit to supine: Total assist, +2 for physical assistance General bed mobility comments: max A to raise trunk and advance LLE, pt sat on EOB x 10 minutes with mod to min assist for balance 2* posterior lean Transfers General transfer comment: not tested -pt to be taken to CT so needed to be in bed      ADL:    Cognition: Cognition Overall Cognitive Status: Impaired/Different from baseline Arousal/Alertness: Lethargic Orientation Level: Oriented to person, Oriented to place Attention: Sustained Sustained Attention:  Impaired Sustained Attention Impairment: Verbal basic, Functional basic Memory: Impaired Memory Impairment: Decreased recall of new information Awareness: Impaired Awareness Impairment: Intellectual impairment Problem Solving: Appears intact (for basic information unrelated to deficits) Safety/Judgment: Impaired Comments: decreased safety awareness related to deficits Cognition Arousal/Alertness: Lethargic Behavior During Therapy: WFL for tasks assessed/performed Overall Cognitive Status: Impaired/Different from baseline Area of Impairment: Orientation, Attention, Memory Orientation Level: Situation, Place General Comments: pt groggy, she opened eyes and responded to questions for short periods, when asked location she stated "MRI", she was able to follow simple commands  Blood pressure (!) 170/76, pulse 66, temperature 97.7 F (36.5 C), temperature source Axillary, resp. rate 20, height 5\' 6"  (1.676 m), weight 74.8 kg (165 lb), SpO2 93 %. Physical Exam  Constitutional: She appears well-developed.  HENT:  Head: Normocephalic.  Eyes:  Pupils round and reactive to light  Neck: Normal range of motion. Neck supple. No thyromegaly present.  Cardiovascular: Normal rate and regular rhythm.   Respiratory: Effort normal and breath sounds normal. No respiratory distress.  GI: Soft. Bowel sounds are normal. She exhibits no distension.  Neurological: She is alert.  Mood is a bit flat but appropriate. She appears to have a right gaze preference.  Some delay in processing but was able to provide her name age and date of birth. Follows simple commands. Is able to locate objects in all visual fields but is slower to do so in the LLQ and to some extent the LUQ. Has difficulties with processing and coordinating activities involving the left arm and leg. Apraxic. Seems to sense pain in all 4. Motor 4/5 to 5/5 in all 4. Oriented to hospital, month and why she's here.  Skin: Skin is warm and dry.    Psychiatric: She has a normal mood and affect. Her behavior is normal.    Results for orders placed or performed during the hospital encounter of 04/22/16 (from the past 24 hour(s))  Glucose, capillary     Status: Abnormal   Collection Time: 04/23/16 11:24 AM  Result Value Ref Range   Glucose-Capillary 145 (H) 65 - 99 mg/dL   Comment 1 Notify RN    Comment 2 Document in Chart   Glucose, capillary     Status: Abnormal   Collection Time: 04/23/16  4:05 PM  Result Value Ref Range   Glucose-Capillary 133 (H) 65 - 99 mg/dL   Comment 1 Notify RN    Comment 2 Document in Chart   Glucose, capillary     Status: Abnormal   Collection Time: 04/23/16 10:09 PM  Result Value Ref Range   Glucose-Capillary 158 (H) 65 - 99 mg/dL   Comment 1 Notify RN    Comment 2 Document in Chart   Glucose, capillary     Status: Abnormal   Collection Time: 04/24/16  6:08 AM  Result Value Ref Range   Glucose-Capillary 130 (H) 65 - 99 mg/dL   Comment 1 Notify RN    Comment 2 Document in Chart    Ct Angio Head W Or Wo Contrast  Result Date: 04/23/2016 CLINICAL DATA:  Stroke, follow-up evaluation. Found on floor. Recent gait imbalance. Clinical concern for acute RIGHT PCA territory infarct. EXAM: CT ANGIOGRAPHY HEAD AND NECK TECHNIQUE: Multidetector CT imaging of the head and neck was performed using the standard protocol during bolus administration of intravenous contrast. Multiplanar CT image reconstructions and MIPs were obtained to evaluate the vascular anatomy. Carotid stenosis measurements (when applicable) are obtained utilizing NASCET criteria, using the distal internal carotid diameter as the denominator. CONTRAST:  50 cc Isovue 370 COMPARISON:  CT HEAD April 22, 2016 FINDINGS: CTA NECK AORTIC ARCH: Normal appearance of the thoracic arch, 2 vessel arch. Moderate intimal thickening calcific atherosclerosis of the aortic arch and LEFT subclavian artery origin. The origins of the innominate, left Common  carotid artery and subclavian artery are widely patent. RIGHT CAROTID SYSTEM: Common carotid artery is widely patent, coursing in a straight line fashion. Normal appearance of the carotid bifurcation without hemodynamically significant stenosis by NASCET criteria. Mild eccentric calcific atherosclerosis. Normal appearance of the included internal carotid artery. LEFT CAROTID SYSTEM: Common carotid artery is widely patent, coursing in a straight line fashion. Mild eccentric intimal thickening. Normal appearance of the carotid bifurcation without hemodynamically significant stenosis by NASCET criteria. Mild to moderate eccentric calcific atherosclerosis. Normal appearance of the included internal carotid artery. VERTEBRAL ARTERIES:Left vertebral artery is dominant. Normal appearance of the vertebral arteries, which appear widely patent. SKELETON: No acute osseous process though bone windows have not been submitted. Moderate C5-6 and C6-7 degenerative discs. OTHER NECK: Soft tissues of the neck are non-acute though, not tailored for evaluation. RIGHT thyroidectomy. Enlarged heterogeneous LEFT thyroid without dominant nodule. CTA HEAD ANTERIOR CIRCULATION: Normal  appearance of the cervical internal carotid arteries, petrous, cavernous and supra clinoid internal carotid arteries. 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 Widely patent anterior communicating artery. Patent anterior and middle cerebral arteries with mild luminal regularity. No large vessel occlusion, hemodynamically significant stenosis, dissection, luminal irregularity, contrast extravasation or aneurysm. POSTERIOR CIRCULATION: Normal appearance of the vertebral arteries, vertebrobasilar junction and basilar artery, as well as main branch vessels. Patent posterior cerebral arteries. Moderate tandem stenoses bilateral posterior cerebral arteries. No large vessel  occlusion, dissection, luminal irregularity, contrast extravasation or aneurysm. VENOUS SINUSES: Major dural venous sinuses are patent though not tailored for evaluation on this angiographic examination. ANATOMIC VARIANTS: None. DELAYED PHASE: No abnormal intracranial enhancement. Review of the MIP images confirms the above findings IMPRESSION: CTA NECK: No hemodynamically significant stenosis or acute vascular process. CTA HEAD:  No emergent large vessel occlusion. 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 Moderate luminal irregularity of the bilateral posterior cerebral arteries compatible with atherosclerosis. Electronically Signed   By: Elon Alas M.D.   On: 04/23/2016 19:39   Dg Chest 1 View  Result Date: 04/22/2016 CLINICAL DATA:  Confusion, severe headache today. Fell yesterday. Found underneath bed today. EXAM: CHEST 1 VIEW COMPARISON:  Chest radiograph June 29, 2006 FINDINGS: Cardiac silhouette is mildly enlarged. Mildly tortuous and atherosclerotic aorta. Strandy densities LEFT lung. No pleural effusion or focal consolidation. No pneumothorax. Osteopenia. Soft tissue planes are unremarkable. IMPRESSION: Mild cardiomegaly.  Similar LEFT lung scarring. Electronically Signed   By: Elon Alas M.D.   On: 04/22/2016 20:35   Ct Head Wo Contrast  Result Date: 04/22/2016 CLINICAL DATA:  Found on floor, previous fall with head injury EXAM: CT HEAD WITHOUT CONTRAST CT CERVICAL SPINE WITHOUT CONTRAST TECHNIQUE: Multidetector CT imaging of the head and cervical spine was performed following the standard protocol without intravenous contrast. Multiplanar CT image reconstructions of the cervical spine were also generated. COMPARISON:  MRI 07/24/2015 FINDINGS: CT HEAD FINDINGS Brain: No acute territorial infarction, intracranial hemorrhage or extra-axial fluid collection is seen. Mild atrophy. Moderate periventricular, subcortical  and deep white matter hypodensities consistent with small vessel disease. Decreased density in the pons, felt to correlate with abnormal small vessel changes on prior MRI. Heterogenous density in the bilateral thalamus, also likely relate to mild signal abnormality noted on T2 images. Vascular: No hyperdense vessels. There are carotid artery calcifications. There are vertebral artery calcifications. Skull: Mastoid air cells are clear.  There is no skull fracture Sinuses/Orbits: Mild mucosal thickening in the ethmoid sinuses. No acute orbital abnormality. Other: None CT CERVICAL SPINE FINDINGS Alignment: No subluxation.  Facet alignment is maintained. Skull base and vertebrae: Craniovertebral junction appears intact. Vertebral body heights are maintained. There is no fracture identified. Soft tissues and spinal canal: No prevertebral fluid or swelling. No visible canal hematoma. Disc levels: Moderate degenerative disc changes at C5-C6 and C6-C7 with endplate changes and osteophytes. Multilevel facet arthropathy. No significant foraminal stenosis. Upper chest: Lung apices clear. Enlarged left lobe of thyroid gland. Other: Carotid artery calcifications. IMPRESSION: 1. No definite CT evidence for acute intracranial abnormality. Moderate periventricular, subcortical and deep white matter small vessel disease. 2. No acute fracture or malalignment of the cervical spine. Moderate degenerative disc changes most notable at C5-C6 and C6-C7. Electronically Signed   By: Donavan Foil M.D.   On: 04/22/2016 20:29   Ct Angio Neck W Or Wo Contrast  Result Date: 04/23/2016 CLINICAL  DATA:  Stroke, follow-up evaluation. Found on floor. Recent gait imbalance. Clinical concern for acute RIGHT PCA territory infarct. EXAM: CT ANGIOGRAPHY HEAD AND NECK TECHNIQUE: Multidetector CT imaging of the head and neck was performed using the standard protocol during bolus administration of intravenous contrast. Multiplanar CT image  reconstructions and MIPs were obtained to evaluate the vascular anatomy. Carotid stenosis measurements (when applicable) are obtained utilizing NASCET criteria, using the distal internal carotid diameter as the denominator. CONTRAST:  50 cc Isovue 370 COMPARISON:  CT HEAD April 22, 2016 FINDINGS: CTA NECK AORTIC ARCH: Normal appearance of the thoracic arch, 2 vessel arch. Moderate intimal thickening calcific atherosclerosis of the aortic arch and LEFT subclavian artery origin. The origins of the innominate, left Common carotid artery and subclavian artery are widely patent. RIGHT CAROTID SYSTEM: Common carotid artery is widely patent, coursing in a straight line fashion. Normal appearance of the carotid bifurcation without hemodynamically significant stenosis by NASCET criteria. Mild eccentric calcific atherosclerosis. Normal appearance of the included internal carotid artery. LEFT CAROTID SYSTEM: Common carotid artery is widely patent, coursing in a straight line fashion. Mild eccentric intimal thickening. Normal appearance of the carotid bifurcation without hemodynamically significant stenosis by NASCET criteria. Mild to moderate eccentric calcific atherosclerosis. Normal appearance of the included internal carotid artery. VERTEBRAL ARTERIES:Left vertebral artery is dominant. Normal appearance of the vertebral arteries, which appear widely patent. SKELETON: No acute osseous process though bone windows have not been submitted. Moderate C5-6 and C6-7 degenerative discs. OTHER NECK: Soft tissues of the neck are non-acute though, not tailored for evaluation. RIGHT thyroidectomy. Enlarged heterogeneous LEFT thyroid without dominant nodule. CTA HEAD ANTERIOR CIRCULATION: Normal appearance of the cervical internal carotid arteries, petrous, cavernous and supra clinoid internal carotid arteries. 2 mm medially directed intact aneurysm LEFT supraclinoid internal carotid artery versus posterior communicating artery  infundibulum. Widely patent anterior communicating artery. Patent anterior and middle cerebral arteries with mild luminal regularity. No large vessel occlusion, hemodynamically significant stenosis, dissection, luminal irregularity, contrast extravasation or aneurysm. POSTERIOR CIRCULATION: Normal appearance of the vertebral arteries, vertebrobasilar junction and basilar artery, as well as main branch vessels. Patent posterior cerebral arteries. Moderate tandem stenoses bilateral posterior cerebral arteries. No large vessel occlusion, dissection, luminal irregularity, contrast extravasation or aneurysm. VENOUS SINUSES: Major dural venous sinuses are patent though not tailored for evaluation on this angiographic examination. ANATOMIC VARIANTS: None. DELAYED PHASE: No abnormal intracranial enhancement. Review of the MIP images confirms the above findings IMPRESSION: CTA NECK: No hemodynamically significant stenosis or acute vascular process. CTA HEAD:  No emergent large vessel occlusion. 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 Moderate luminal irregularity of the bilateral posterior cerebral arteries compatible with atherosclerosis. Electronically Signed   By: Elon Alas M.D.   On: 04/23/2016 19:39   Ct Cervical Spine Wo Contrast  Result Date: 04/22/2016 CLINICAL DATA:  Found on floor, previous fall with head injury EXAM: CT HEAD WITHOUT CONTRAST CT CERVICAL SPINE WITHOUT CONTRAST TECHNIQUE: Multidetector CT imaging of the head and cervical spine was performed following the standard protocol without intravenous contrast. Multiplanar CT image reconstructions of the cervical spine were also generated. COMPARISON:  MRI 07/24/2015 FINDINGS: CT HEAD FINDINGS Brain: No acute territorial infarction, intracranial hemorrhage or extra-axial fluid collection is seen. Mild atrophy. Moderate periventricular, subcortical and deep white matter  hypodensities consistent with small vessel disease. Decreased density in the pons, felt to correlate with abnormal small vessel changes on prior MRI. Heterogenous density in the bilateral thalamus, also  likely relate to mild signal abnormality noted on T2 images. Vascular: No hyperdense vessels. There are carotid artery calcifications. There are vertebral artery calcifications. Skull: Mastoid air cells are clear.  There is no skull fracture Sinuses/Orbits: Mild mucosal thickening in the ethmoid sinuses. No acute orbital abnormality. Other: None CT CERVICAL SPINE FINDINGS Alignment: No subluxation.  Facet alignment is maintained. Skull base and vertebrae: Craniovertebral junction appears intact. Vertebral body heights are maintained. There is no fracture identified. Soft tissues and spinal canal: No prevertebral fluid or swelling. No visible canal hematoma. Disc levels: Moderate degenerative disc changes at C5-C6 and C6-C7 with endplate changes and osteophytes. Multilevel facet arthropathy. No significant foraminal stenosis. Upper chest: Lung apices clear. Enlarged left lobe of thyroid gland. Other: Carotid artery calcifications. IMPRESSION: 1. No definite CT evidence for acute intracranial abnormality. Moderate periventricular, subcortical and deep white matter small vessel disease. 2. No acute fracture or malalignment of the cervical spine. Moderate degenerative disc changes most notable at C5-C6 and C6-C7. Electronically Signed   By: Donavan Foil M.D.   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 Does the need for close, 24 hr/day medical supervision in concert with the patient's rehab needs make it unreasonable for this patient to be served in a less intensive setting? Yes 2. Co-Morbidities requiring supervision/potential complications: DM, HTN,  3. Due to bladder management, bowel management, safety,  skin/wound care, disease management, medication administration, pain management and patient education, does the patient require 24 hr/day rehab nursing? Yes 4. Does the patient require coordinated care of a physician, rehab nurse, PT (1-2 hrs/day, 5 days/week), OT (1-2 hrs/day, 5 days/week) and SLP (1-2 hrs/day, 5 days/week) to address physical and functional deficits in the context of the above medical diagnosis(es)? Yes Addressing deficits in the following areas: balance, endurance, locomotion, strength, transferring, bowel/bladder control, bathing, dressing, feeding, grooming, toileting, cognition and psychosocial support 5. Can the patient actively participate in an intensive therapy program of at least 3 hrs of therapy per day at least 5 days per week? Yes 6. The potential for patient to make measurable gains while on inpatient rehab is excellent 7. Anticipated functional outcomes upon discharge from inpatient rehab are modified independent and supervision  with PT, modified independent and supervision with OT, modified independent with SLP. 8. Estimated rehab length of stay to reach the above functional goals is: 12-16 days 9. Does the patient have adequate social supports and living environment to accommodate these discharge functional goals? Yes 10. Anticipated D/C setting: Home 11. Anticipated post D/C treatments: HH therapy and Outpatient therapy 12. Overall Rehab/Functional Prognosis: excellent  RECOMMENDATIONS: This patient's condition is appropriate for continued rehabilitative care in the following setting: CIR Patient has agreed to participate in recommended program. Yes Note that insurance prior authorization may be required for reimbursement for recommended care.  Comment: Rehab Admissions Coordinator to follow up.  Thanks,  Meredith Staggers, MD, Mellody Drown     04/24/2016

## 2016-04-24 NOTE — Progress Notes (Signed)
STROKE TEAM PROGRESS NOTE   HISTORY OF PRESENT ILLNESS (per record) Amy [name] is an 82 y.o. female with a history of hypertension, hypothyroidism, diabetes mellitus and diverticulosis, presenting with new onset loss of vision on the left side as well as confusion, and to some extent agitation. She has no previous history of stroke nor TIA clinically, and has been taking aspirin 81 mg per day. CT scan of the head showed no acute intracranial abnormality. MRI was attempted, but patient was agitated and study could not be obtained. She was noted in the ED to be confused and to have complete visual loss on the left side. No focal weakness was noted. NIH stroke score the time of this evaluation was 10. She was LKW 04/21/2016. Patient was not administered IV t-PA secondary to beyond time under for treatment consideration. She was admitted for further evaluation and treatment.   SUBJECTIVE (INTERVAL HISTORY) Her family is at the bedside.  Overall she feels her condition is improving.  She is more alert and interactive today and attended to the left side. OBJECTIVE Temp:  [97.7 F (36.5 C)-99.1 F (37.3 C)] 98.1 F (36.7 C) (11/30 1449) Pulse Rate:  [64-87] 64 (11/30 1449) Cardiac Rhythm: Normal sinus rhythm (11/30 0700) Resp:  [20] 20 (11/30 1449) BP: (146-185)/(71-90) 170/71 (11/30 1449) SpO2:  [92 %-98 %] 96 % (11/30 1449)  CBC:   Recent Labs Lab 04/22/16 1928 04/22/16 2100  WBC  --  9.9  NEUTROABS  --  7.0  HGB 12.2 12.2  HCT 36.0 35.3*  MCV  --  90.1  PLT  --  [id number]    Basic Metabolic Panel:   Recent Labs Lab 04/22/16 1928 04/22/16 1932  NA 139 137  K 3.5 3.5  CL 98* 101  CO2  --  27  GLUCOSE 131* 130*  BUN 14 12  CREATININE 0.80 0.91  CALCIUM  --  9.4    Lipid Panel:     Component Value Date/Time   CHOL 206 (H) 04/23/2016 0435   TRIG 135 04/23/2016 0435   TRIG 249 (HH) 03/11/2006 1510   HDL 65 04/23/2016 0435   CHOLHDL 3.2 04/23/2016 0435   VLDL 27  04/23/2016 0435   LDLCALC 114 (H) 04/23/2016 0435   HgbA1c:  Lab Results  Component Value Date   HGBA1C 7.1 (H) 04/23/2016   Urine Drug Screen: No results found for: LABOPIA, COCAINSCRNUR, LABBENZ, AMPHETMU, THCU, LABBARB    IMAGING  Ct Head Wo Contrast 04/22/2016 No definite CT evidence for acute intracranial abnormality. Moderate periventricular, subcortical and deep white matter small vessel disease.   Ct Cervical Spine Wo Contrast 04/22/2016 No acute fracture or malalignment of the cervical spine. Moderate degenerative disc changes most notable at C5-C6 and C6-C7.   Dg Chest 1 View 04/22/2016 Mild cardiomegaly.  Similar LEFT lung scarring.    PHYSICAL EXAM Pleasant elderly Caucasian lady not in distress. . Afebrile. Head is nontraumatic. Neck is supple without bruit.    Cardiac exam no murmur or gallop. Lungs are clear to auscultation. Distal pulses are well felt. Neurological Exam :  Awake and interactive today. No right gaze preference. Able to look all the way to the left past midline. She will blink to threat on both sides but slightly less on the left. She was ignoring her son who was standing on her left but identified rest of the family members in the room. She is not cooperative for detailed visual field testing.  Darletta Moll were not  visualized. Mild dysarthria but can be understood. Follows simple midline and one-step commands. No Hammond neglect today. No facial weakness.. Tongue midline. Purposeful antigravity strength on the right without weakness. Very minimum weakness of left grip and intrinsic hand muscles. Orbits right over left approximately. No drift on the left. Good strength in the legs bilaterally. Plantars downgoing. Deep tendon reflexes symmetric. Gait not tested. ASSESSMENT/PLAN [name] is a 82 y.o. female with history of HTN, DM, hypothyroidism, and diverticulosis presenting with L sided vision loss and confusion. She did not receive IV t-PA due to  beyond time treatment window.    Stroke:  Suspected small right brain infarct not visualized on MRI versus unwitnessed seizure with postictal state   Resultant  Neglect, L sided weakness  MRI  no acute infarct. Micro-hemorrhages in the cortex suggestive of possible amyloid angiopathy.   MRA  not done  Carotid Doppler  Bilateral 1-39% ICA stenosis, antegrade vertebral flow.  2D Echo  pending   LDL 114  HgbA1c 7.1  Lovenox 40 mg sq daily for VTE prophylaxis Diet Carb Modified Fluid consistency: Thin; Room service appropriate? Yes  aspirin 81 mg daily prior to admission, now on aspirin 325 mg daily  Patient counseled to be compliant with her antithrombotic medications  Ongoing aggressive stroke risk factor management  Therapy recommendations:  pending   Disposition:  pending  (lives alone)  DNR after discussion with family. She has a living will.  Hypertensive Urgency  BP as high as 185/108  Stable this am in the 160s  Permissive hypertension (OK if < 220/120) but gradually normalize in 5-7 days  Long-term BP goal normotensive  Hyperlipidemia  Home meds:  lipitor 20, resumed in hospital  LDL 114, goal < 70  Continue statin at discharge  Diabetes type II  HgbA1c7.1, goal < 7.0  Other Stroke Risk Factors  Advanced age  Family hx stroke (father, sister)  Hospital day # [place] for Pager information 04/24/2016 3:11 PM  I have personally examined this patient, reviewed notes, independently viewed imaging studies, participated in medical decision making and plan of care.ROS completed by me personally and pertinent positives fully documented  I have made any additions or clarifications directly to the above note. Agree with note above.  Patient has presented with left-sided weakness and right-sided neglect which was worrisome for a right brain stroke but MRI scan does not show this. CT angiogram showed no significant  large vessel stenosis or occlusion. She perhaps had a unwitnessed seizure and this may be prolonged postictal state. EEG is pending. Start Keppra XR 500 mg daily for seizure prophylaxis. Long discussion with multiple family members at the bedside and answered questions. Greater than 50% time during this 25 minute visit was spent on counseling and coordination of care: The patient's stroke, evaluation and treatment discussed with Dr. Lambert Keto, [place] Pager: [phone] 04/24/2016 3:11 PM  To contact Stroke Continuity provider, please refer to [email or web address] After hours, contact General Neurology

## 2016-04-24 NOTE — Progress Notes (Signed)
EEG Completed; Results Pending  

## 2016-04-24 NOTE — Evaluation (Signed)
Occupational Therapy Evaluation Patient Details Name: Amy [name] MRN: [id number] DOB: Jul 03, 1929 Today's Date: 04/24/2016    History of Present Illness 82 y.o. female admitted with L hemiparesis, L homonymous hemianopsia. PMH of DM, HTN. Initial CT negative for acute infarct, further Imaging pending.    Clinical Impression   Pt was independent in ADL and IADL, but family reports she has a hx of falls. Family had concerns about pt's safety with driving as well. Limited session today due to pt having nausea with standing. Requires 2 person assist for all mobility and min to total assist for ADL. Pt demonstrates poor attention and motor planning deficits. Will require post acute rehab prior to discharge. Will follow.    Follow Up Recommendations  CIR    Equipment Recommendations   (defer to next venue)    Recommendations for Other Services       Precautions / Restrictions Precautions Precautions: Fall Precaution Comments: h/o at least 3 falls in past 1 year per family, pt resistant to using device Restrictions Weight Bearing Restrictions: No      Mobility Bed Mobility Overal bed mobility: +2 for physical assistance;Needs Assistance Bed Mobility: Supine to Sit;Sit to Supine     Supine to sit: +2 for physical assistance;Mod assist Sit to supine: +2 for physical assistance;Max assist   General bed mobility comments: very inefficient movement, requiring assist for trunk and LEs  Transfers Overall transfer level: Needs assistance   Transfers: Sit to/from Stand Sit to Stand: +2 physical assistance;Mod assist         General transfer comment: pt stood from bed x 20 seconds, assist to rise and steady and to place L hand on walker, pt with nausea, requiring return to sitting     Balance     Sitting balance-Leahy Scale: Poor Sitting balance - Comments: posterior lean, sat EOB approx 10 minutes                                    ADL Overall ADL's  : Needs assistance/impaired Eating/Feeding: Minimal assistance;Bed level   Grooming: Wash/dry face;Sitting;Min guard   Upper Body Bathing: Maximal assistance;Sitting   Lower Body Bathing: +2 for physical assistance;Total assistance;Sit to/from stand   Upper Body Dressing : Moderate assistance;Sitting   Lower Body Dressing: +2 for physical assistance;Total assistance;Sit to/from stand                       Vision Additional Comments: attends to all visual spaces, difficulty to formally assess due to attention   Perception     Praxis Praxis Praxis-Other Comments: impaired motor planning    Pertinent Vitals/Pain Pain Assessment: No/denies pain     Hand Dominance Right   Extremity/Trunk Assessment Upper Extremity Assessment Upper Extremity Assessment: RUE deficits/detail;LUE deficits/detail RUE Deficits / Details: grossly 4/5 LUE Deficits / Details: strength grossly 4/5, poor coordination, grasping at bed sheets and clothing, mistaked OT's arm for her own  LUE Sensation: decreased proprioception LUE Coordination: decreased fine motor;decreased gross motor   Lower Extremity Assessment Lower Extremity Assessment: Defer to PT evaluation       Communication Communication Communication: No difficulties   Cognition Arousal/Alertness: Awake/alert Behavior During Therapy: WFL for tasks assessed/performed Overall Cognitive Status: Impaired/Different from baseline Area of Impairment: Attention;Memory;Following commands;Safety/judgement   Current Attention Level: Focused Memory: Decreased short-term memory Following Commands: Follows one step commands with increased time (and multimodal cues)  Safety/Judgement: Decreased awareness of safety;Decreased awareness of deficits         General Comments       Exercises       Shoulder Instructions      Home Living Family/patient expects to be discharged to:: Private residence Living Arrangements: Alone Available  Help at Discharge: Family;Friend(s) Type of Home: House Home Access: Stairs to enter [employer] of Steps: 1   Home Layout: One level     Bathroom Shower/Tub: Occupational psychologist: Standard     Home Equipment: Environmental consultant - 2 wheels      Lives With: Alone    Prior Functioning/Environment Level of Independence: Independent        Comments: family reports pt has had several falls in past year and has had concerns about her safety; pt drove and did her own shopping PTA, son stating she was only supposed to drive short distances        OT Problem List: Decreased strength;Decreased activity tolerance;Impaired balance (sitting and/or standing);Decreased coordination;Decreased cognition;Decreased knowledge of use of DME or AE;Impaired UE functional use;Decreased safety awareness;Impaired vision/perception   OT Treatment/Interventions: Self-care/ADL training;DME and/or AE instruction;Therapeutic activities;Cognitive remediation/compensation;Patient/family education;Visual/perceptual remediation/compensation;Balance training    OT Goals(Current goals can be found in the care plan section) Acute Rehab OT Goals Patient Stated Goal: family would like CIR if possible OT Goal Formulation: With patient Time For Goal Achievement: 05/08/16 Potential to Achieve Goals: Good ADL Goals Pt Will Perform Eating: with set-up;sitting Pt Will Perform Grooming: with set-up;sitting;with min assist Pt Will Perform Upper Body Bathing: with min assist;sitting Pt Will Perform Upper Body Dressing: with min assist;sitting Pt Will Transfer to Toilet: with min assist;with +2 assist;ambulating;bedside commode Additional ADL Goal #1: Pt will perform bed mobility with min assist. Additional ADL Goal #2: Pt will demonstrate good sitting balance at EOB as a precursor to ADL.  OT Frequency: Min 2X/week   Barriers to D/C:            Co-evaluation PT/OT/SLP Co-Evaluation/Treatment:  Yes Reason for Co-Treatment: For patient/therapist safety;Necessary to address cognition/behavior during functional activity   OT goals addressed during session: ADL's and self-care      End of Session Equipment Utilized During Treatment: Gait belt;Rolling walker  Activity Tolerance: Treatment limited secondary to medical complications (Comment) (nausea) Patient left: in bed;with call bell/phone within reach;with bed alarm set;with family/visitor present   Time: 1341-1407 OT Time Calculation (min): 26 min Charges:  OT General Charges $OT Visit: 1 Procedure OT Evaluation $OT Eval Moderate Complexity: 1 Procedure G-Codes:    Malka So 04/24/2016, 3:11 PM  [phone]

## 2016-04-24 NOTE — Procedures (Signed)
Electroencephalogram (EEG) Report  Date of study: 04/24/16  Requesting clinician: Antony Contras, MD  Reason for study: r/o seizure  Brief clinical history: This is an 52-yo woman admitted with stroke. EEG for further evaluation.  Medications:  Current Facility-Administered Medications:  .  acetaminophen (TYLENOL) tablet 650 mg, 650 mg, Oral, Q6H PRN, Gardiner Barefoot, NP, 650 mg at 04/24/16 1438 .  aspirin suppository 300 mg, 300 mg, Rectal, Daily, 300 mg at 04/22/16 2345 **OR** aspirin tablet 325 mg, 325 mg, Oral, Daily, Etta Quill, DO, 325 mg at 04/24/16 1438 .  atorvastatin (LIPITOR) tablet 40 mg, 40 mg, Oral, Daily, Jonetta Osgood, MD, 40 mg at 04/24/16 1438 .  enoxaparin (LOVENOX) injection 40 mg, 40 mg, Subcutaneous, QHS, Jared M Gardner, DO, 40 mg at 04/23/16 2205 .  insulin aspart (novoLOG) injection 0-15 Units, 0-15 Units, Subcutaneous, TID WC, Etta Quill, DO, 3 Units at 04/24/16 1720 .  levETIRAcetam (KEPPRA XR) 24 hr tablet 500 mg, 500 mg, Oral, Daily, Garvin Fila, MD, 500 mg at 04/24/16 1719 .  levothyroxine (SYNTHROID, LEVOTHROID) tablet 175 mcg, 175 mcg, Oral, QAC breakfast, Etta Quill, DO, 175 mcg at 04/24/16 [id number] .  LORazepam (ATIVAN) injection 1 mg, 1 mg, Intravenous, Once PRN, Jonetta Osgood, MD .  ondansetron [facility]) injection 4 mg, 4 mg, Intravenous, Q6H PRN, Jonetta Osgood, MD, 4 mg at 04/24/16 1438 .  pantoprazole (PROTONIX) EC tablet 80 mg, 80 mg, Oral, Daily, Etta Quill, DO, 80 mg at 04/24/16 1438 .  sertraline (ZOLOFT) tablet 100 mg, 100 mg, Oral, Daily, Etta Quill, DO, 100 mg at 04/24/16 1438  Description: This is a routine EEG performed using standard international 10-20 electrode placement. A total of 18 channels are recorded, including one for the EKG. Wakefulness, drowsiness, and sleep are recorded.   Activating Maneuvers: none  Findings:  The EKG channel demonstrates a regular rhythm with a rate of 70 beats per  minute.   The background consists of theta activity with average frequency of 6-7 Hz on the L. The best dominant posterior rhythm is 7. This is symmetric and reacts as expected with eye opening. There is intermixed delta activity that is most pronounce in the R frontotemporal region.   No epileptiform discharges are present. No seizures are recorded.   Drowsiness is recorded and is normal in appearance. Early stages of sleep are recorded and demonstrate normal architecture.    Impression: This is an abnormal EEG due to focal right frontotemporal slowing, suggesting regional cortical or cerebral dysfunction in that area. No epileptiform activity or seizures are recorded.    Melba Coon, MD [facility]

## 2016-04-25 ENCOUNTER — Ambulatory Visit: Payer: Medicare Other | Admitting: Family Medicine

## 2016-04-25 ENCOUNTER — Inpatient Hospital Stay (HOSPITAL_COMMUNITY)
Admission: RE | Admit: 2016-04-25 | Discharge: 2016-05-07 | DRG: 057 | Disposition: A | Discharge status: Home Health | Payer: Medicare Other | Source: Intra-hospital | Attending: Physical Medicine & Rehabilitation | Admitting: Physical Medicine & Rehabilitation

## 2016-04-25 ENCOUNTER — Inpatient Hospital Stay (HOSPITAL_COMMUNITY): Payer: Medicare Other

## 2016-04-25 DIAGNOSIS — E1151 Type 2 diabetes mellitus with diabetic peripheral angiopathy without gangrene: Secondary | ICD-10-CM | POA: Diagnosis present

## 2016-04-25 DIAGNOSIS — E039 Hypothyroidism, unspecified: Secondary | ICD-10-CM | POA: Diagnosis present

## 2016-04-25 DIAGNOSIS — R32 Unspecified urinary incontinence: Secondary | ICD-10-CM | POA: Diagnosis present

## 2016-04-25 DIAGNOSIS — R21 Rash and other nonspecific skin eruption: Secondary | ICD-10-CM | POA: Diagnosis not present

## 2016-04-25 DIAGNOSIS — Z79899 Other long term (current) drug therapy: Secondary | ICD-10-CM

## 2016-04-25 DIAGNOSIS — E1142 Type 2 diabetes mellitus with diabetic polyneuropathy: Secondary | ICD-10-CM | POA: Diagnosis present

## 2016-04-25 DIAGNOSIS — Z91040 Latex allergy status: Secondary | ICD-10-CM

## 2016-04-25 DIAGNOSIS — I10 Essential (primary) hypertension: Secondary | ICD-10-CM | POA: Diagnosis present

## 2016-04-25 DIAGNOSIS — Z7409 Other reduced mobility: Secondary | ICD-10-CM | POA: Diagnosis present

## 2016-04-25 DIAGNOSIS — H53462 Homonymous bilateral field defects, left side: Secondary | ICD-10-CM

## 2016-04-25 DIAGNOSIS — K5900 Constipation, unspecified: Secondary | ICD-10-CM | POA: Diagnosis present

## 2016-04-25 DIAGNOSIS — Z794 Long term (current) use of insulin: Secondary | ICD-10-CM | POA: Diagnosis not present

## 2016-04-25 DIAGNOSIS — F329 Major depressive disorder, single episode, unspecified: Secondary | ICD-10-CM | POA: Diagnosis present

## 2016-04-25 DIAGNOSIS — I69390 Apraxia following cerebral infarction: Secondary | ICD-10-CM | POA: Diagnosis not present

## 2016-04-25 DIAGNOSIS — R569 Unspecified convulsions: Secondary | ICD-10-CM

## 2016-04-25 DIAGNOSIS — R414 Neurologic neglect syndrome: Secondary | ICD-10-CM | POA: Diagnosis not present

## 2016-04-25 DIAGNOSIS — N319 Neuromuscular dysfunction of bladder, unspecified: Secondary | ICD-10-CM | POA: Diagnosis not present

## 2016-04-25 DIAGNOSIS — E785 Hyperlipidemia, unspecified: Secondary | ICD-10-CM | POA: Diagnosis present

## 2016-04-25 DIAGNOSIS — Z888 Allergy status to other drugs, medicaments and biological substances status: Secondary | ICD-10-CM | POA: Diagnosis not present

## 2016-04-25 DIAGNOSIS — I6789 Other cerebrovascular disease: Secondary | ICD-10-CM

## 2016-04-25 DIAGNOSIS — I639 Cerebral infarction, unspecified: Secondary | ICD-10-CM | POA: Diagnosis not present

## 2016-04-25 DIAGNOSIS — Z0289 Encounter for other administrative examinations: Secondary | ICD-10-CM

## 2016-04-25 LAB — CBC
HEMATOCRIT: 33.9 % — AB (ref 36.0–46.0)
HEMOGLOBIN: 11.3 g/dL — AB (ref 12.0–15.0)
MCH: 30 pg (ref 26.0–34.0)
MCHC: 33.3 g/dL (ref 30.0–36.0)
MCV: 89.9 fL (ref 78.0–100.0)
Platelets: 190 10*3/uL (ref 150–400)
RBC: 3.77 MIL/uL — AB (ref 3.87–5.11)
RDW: 12.9 % (ref 11.5–15.5)
WBC: 7.2 10*3/uL (ref 4.0–10.5)

## 2016-04-25 LAB — CREATININE, SERUM
Creatinine, Ser: 1.14 mg/dL — ABNORMAL HIGH (ref 0.44–1.00)
GFR calc Af Amer: 50 mL/min — ABNORMAL LOW
GFR calc non Af Amer: 43 mL/min — ABNORMAL LOW

## 2016-04-25 LAB — GLUCOSE, CAPILLARY
GLUCOSE-CAPILLARY: 181 mg/dL — AB (ref 65–99)
GLUCOSE-CAPILLARY: 130 mg/dL — AB (ref 65–99)
Glucose-Capillary: 153 mg/dL — ABNORMAL HIGH (ref 65–99)

## 2016-04-25 MED ORDER — SERTRALINE HCL 50 MG PO TABS
100.0000 mg | ORAL_TABLET | Freq: Every day | ORAL | Status: DC
  Administered 2016-04-26 – 2016-04-30 (×5): 100 mg via ORAL
  Filled 2016-04-25 (×5): qty 2

## 2016-04-25 MED ORDER — ASPIRIN 325 MG PO TABS
325.0000 mg | ORAL_TABLET | Freq: Every day | ORAL | Status: DC
  Administered 2016-04-26 – 2016-05-07 (×12): 325 mg via ORAL
  Filled 2016-04-25 (×12): qty 1

## 2016-04-25 MED ORDER — LEVOTHYROXINE SODIUM 75 MCG PO TABS
175.0000 ug | ORAL_TABLET | Freq: Every day | ORAL | Status: DC
  Administered 2016-04-26 – 2016-05-07 (×12): 175 ug via ORAL
  Filled 2016-04-25 (×12): qty 1

## 2016-04-25 MED ORDER — INSULIN ASPART 100 UNIT/ML ~~LOC~~ SOLN
0.0000 [IU] | Freq: Three times a day (TID) | SUBCUTANEOUS | Status: DC
  Administered 2016-04-26: 3 [IU] via SUBCUTANEOUS
  Administered 2016-04-26 – 2016-04-27 (×3): 2 [IU] via SUBCUTANEOUS
  Administered 2016-04-27 – 2016-04-28 (×2): 3 [IU] via SUBCUTANEOUS
  Administered 2016-04-28 – 2016-04-29 (×3): 2 [IU] via SUBCUTANEOUS
  Administered 2016-04-29 – 2016-04-30 (×2): 3 [IU] via SUBCUTANEOUS
  Administered 2016-04-30: 2 [IU] via SUBCUTANEOUS
  Administered 2016-05-02: 3 [IU] via SUBCUTANEOUS
  Administered 2016-05-02 – 2016-05-06 (×7): 2 [IU] via SUBCUTANEOUS

## 2016-04-25 MED ORDER — ASPIRIN 300 MG RE SUPP
300.0000 mg | Freq: Every day | RECTAL | Status: DC
  Filled 2016-04-25 (×4): qty 1

## 2016-04-25 MED ORDER — PANTOPRAZOLE SODIUM 40 MG PO TBEC
80.0000 mg | DELAYED_RELEASE_TABLET | Freq: Every day | ORAL | Status: DC
  Administered 2016-04-26 – 2016-05-07 (×12): 80 mg via ORAL
  Filled 2016-04-25 (×13): qty 2

## 2016-04-25 MED ORDER — ENOXAPARIN SODIUM 40 MG/0.4ML ~~LOC~~ SOLN
40.0000 mg | Freq: Every day | SUBCUTANEOUS | Status: DC
  Administered 2016-04-25 – 2016-05-06 (×12): 40 mg via SUBCUTANEOUS
  Filled 2016-04-25 (×12): qty 0.4

## 2016-04-25 MED ORDER — ENOXAPARIN SODIUM 40 MG/0.4ML ~~LOC~~ SOLN: 40.0000 mg | SUBCUTANEOUS | Status: DC

## 2016-04-25 MED ORDER — LEVETIRACETAM ER 500 MG PO TB24
500.0000 mg | ORAL_TABLET | Freq: Every day | ORAL | Status: DC
  Administered 2016-04-26 – 2016-05-07 (×12): 500 mg via ORAL
  Filled 2016-04-25 (×14): qty 1

## 2016-04-25 MED ORDER — ACETAMINOPHEN 325 MG PO TABS
650.0000 mg | ORAL_TABLET | Freq: Four times a day (QID) | ORAL | Status: DC | PRN
  Administered 2016-04-25 – 2016-05-03 (×2): 650 mg via ORAL
  Filled 2016-04-25 (×2): qty 2

## 2016-04-25 MED ORDER — ATORVASTATIN CALCIUM 40 MG PO TABS
40.0000 mg | ORAL_TABLET | Freq: Every day | ORAL | Status: DC
  Administered 2016-04-26 – 2016-05-07 (×12): 40 mg via ORAL
  Filled 2016-04-25 (×13): qty 1

## 2016-04-25 MED ORDER — ONDANSETRON HCL 4 MG/2ML IJ SOLN: 4.0000 mg | Freq: Four times a day (QID) | INTRAMUSCULAR | Status: DC | PRN

## 2016-04-25 MED ORDER — ATORVASTATIN CALCIUM 40 MG PO TABS: 40.0000 mg | ORAL_TABLET | Freq: Every day | ORAL | Status: DC

## 2016-04-25 MED ORDER — LEVETIRACETAM ER 500 MG PO TB24: 500.0000 mg | ORAL_TABLET | Freq: Every day | ORAL | Status: DC

## 2016-04-25 MED ORDER — INSULIN ASPART 100 UNIT/ML ~~LOC~~ SOLN: SUBCUTANEOUS | 11 refills | Status: DC

## 2016-04-25 MED ORDER — ONDANSETRON HCL 4 MG PO TABS
4.0000 mg | ORAL_TABLET | Freq: Four times a day (QID) | ORAL | Status: DC | PRN
  Administered 2016-04-30 – 2016-05-05 (×2): 4 mg via ORAL
  Filled 2016-04-25 (×2): qty 1

## 2016-04-25 MED ORDER — ASPIRIN 325 MG PO TABS
325.0000 mg | ORAL_TABLET | Freq: Every day | ORAL | Status: DC
Start: 2016-04-26 — End: 2016-05-12

## 2016-04-25 MED ORDER — SORBITOL 70 % SOLN
30.0000 mL | Freq: Every day | Status: DC | PRN
  Administered 2016-04-26 – 2016-05-03 (×2): 30 mL via ORAL
  Filled 2016-04-25 (×3): qty 30

## 2016-04-25 NOTE — Progress Notes (Signed)
Meredith Staggers, MD Physician Signed Physical Medicine and Rehabilitation  Consult Note Date of Service: 04/24/2016 6:17 AM  Related encounter: ED to Hosp-Admission (Current) from 04/22/2016 in [facility] All Collapse All   [] Hide copied text [] Hover for attribution information      Physical Medicine and Rehabilitation Consult Reason for Consult: Suspect right brain infarct Referring Physician: I   HPI: Amy [name] is a 82 y.o. right handed female with history of diabetes mellitus, hypertension. Per chart review patient lives alone and was independent and still driving short distances prior to admission. Presented 04/22/2016 with altered mental status as well as decreased vision on the left. Daughter had reported she had difficulty with her balance for the past several weeks as well as occasional incontinence of her bowel. Cranial CT scan as well as CT cervical spine negative for acute changes. There was moderate periventricular, subcortical and deep white matter small vessel disease identified on cranial CT. Patient did not receive TPA. CTA of head and neck with no significant stenosis or large vessel occlusion. Carotid Doppler showed no ICA stenosis. Echocardiogram pending. Neurology consulted suspect right brain infarct with workup currently ongoing maintained on aspirin for CVA prophylaxis as well as subcutaneous Lovenox for DVT prophylaxis. Tolerating a regular consistency diet. Physical therapy evaluation completed with recommendations of physical medicine rehabilitation consult.   Review of Systems  Constitutional: Negative for chills and fever.  HENT: Negative for hearing loss and tinnitus.   Eyes: Negative for blurred vision and double vision.  Respiratory: Negative for shortness of breath.   Cardiovascular: Negative for chest pain, palpitations and leg swelling.  Gastrointestinal: Negative for nausea and vomiting.    Genitourinary: Positive for frequency and urgency. Negative for dysuria and flank pain.       Occasional bowel incontinence  Musculoskeletal: Positive for myalgias. Negative for falls.  Skin: Negative for rash.  Neurological: Positive for dizziness and weakness. Negative for seizures and loss of consciousness.  Psychiatric/Behavioral: Positive for depression.  All other systems reviewed and are negative.      Past Medical History:  Diagnosis Date  . ABSCESS 12/18/2009  . DIABETES MELLITUS, TYPE II 05/22/2009  . Diverticulosis   . Hemorrhoids   . HYPERGLYCEMIA 05/22/2009  . HYPERTENSION 04/13/2007  . HYPOTHYROIDISM 04/13/2007  . NEOPLASM, SKIN, UNCERTAIN BEHAVIOR [id number]  . Tubular adenoma of colon 02/2001        Past Surgical History:  Procedure Laterality Date  . ABDOMINAL HYSTERECTOMY    . APPENDECTOMY    . CHOLECYSTECTOMY    . COLONOSCOPY  04-05-04   per Dr. Deatra Ina, no polyps   . OOPHORECTOMY    . THYROIDECTOMY     secondary to nodule, no cancer  . TONSILLECTOMY AND ADENOIDECTOMY          Family History  Problem Relation Age of Onset  . Stroke Father   . Hypertension Mother   . Huntington's disease Daughter 2  . Stroke Sister   . Colon cancer Neg Hx    Social History:  reports that she has never smoked. She has never used smokeless tobacco. She reports that she does not drink alcohol or use drugs. Allergies:       Allergies  Allergen Reactions  . Piroxicam Other (See Comments)    REACTION: "SEVERE ALLERGY REACTION"  . Ativan [Lorazepam] Other (See Comments)    Caused delirium, confusion, disorientation, slightly increased agitation.  . Diclofenac Sodium Nausea And  Vomiting  . Latex Rash    ?  Marland Kitchen Tape Rash    ?         Medications Prior to Admission  Medication Sig Dispense Refill  . atorvastatin (LIPITOR) 20 MG tablet TAKE ONE TABLET BY MOUTH ONCE DAILY 90 tablet 3  . glipiZIDE (GLUCOTROL) 10 MG tablet Take 10 mg by  mouth 2 (two) times daily before a meal.    . levothyroxine (SYNTHROID) 175 MCG tablet Take 1 tablet (175 mcg total) by mouth daily before breakfast. 30 tablet 11  . metFORMIN (GLUCOPHAGE) 1000 MG tablet Take 1 tablet (1,000 mg total) by mouth 2 (two) times daily with a meal. 180 tablet 3  . sertraline (ZOLOFT) 100 MG tablet Take 1 tablet (100 mg total) by mouth daily. 30 tablet 5  . amLODipine (NORVASC) 5 MG tablet TAKE ONE TABLET BY MOUTH ONCE DAILY (Patient not taking: Reported on 04/23/2016) 90 tablet 3  . aspirin 81 MG tablet Take 81 mg by mouth daily.      . cyclobenzaprine (FLEXERIL) 10 MG tablet Take 1 tablet (10 mg total) by mouth 3 (three) times daily as needed for muscle spasms. (Patient not taking: Reported on 04/23/2016) 90 tablet 5  . ibuprofen (ADVIL,MOTRIN) 200 MG tablet Take 400 mg by mouth every 6 (six) hours as needed for moderate pain.    Marland Kitchen omeprazole (PRILOSEC) 40 MG capsule Take 1 capsule (40 mg total) by mouth daily. (Patient taking differently: Take 40 mg by mouth daily as needed (heartburn). ) 30 capsule 11    Home: Home Living Family/patient expects to be discharged to:: Private residence Living Arrangements: Alone Available Help at Discharge: Family, Friend(s) Type of Home: House Home Access: Stairs to enter Technical brewer of Steps: 1 Home Layout: One level Home Equipment: Environmental consultant - 2 wheels  Lives With: Alone  Functional History: Prior Function Level of Independence: Independent Comments: family reports pt has had several falls in past year and has had concerns about her safety; pt drove and did her own shopping PTA Functional Status:  Mobility: Bed Mobility Overal bed mobility: +2 for physical assistance, Needs Assistance Bed Mobility: Supine to Sit, Sit to Supine Supine to sit: Max assist, +2 for physical assistance Sit to supine: Total assist, +2 for physical assistance General bed mobility comments: max A to raise trunk and advance LLE,  pt sat on EOB x 10 minutes with mod to min assist for balance 2* posterior lean Transfers General transfer comment: not tested -pt to be taken to CT so needed to be in bed      ADL:    Cognition: Cognition Overall Cognitive Status: Impaired/Different from baseline Arousal/Alertness: Lethargic Orientation Level: Oriented to person, Oriented to place Attention: Sustained Sustained Attention: Impaired Sustained Attention Impairment: Verbal basic, Functional basic Memory: Impaired Memory Impairment: Decreased recall of new information Awareness: Impaired Awareness Impairment: Intellectual impairment Problem Solving: Appears intact (for basic information unrelated to deficits) Safety/Judgment: Impaired Comments: decreased safety awareness related to deficits Cognition Arousal/Alertness: Lethargic Behavior During Therapy: WFL for tasks assessed/performed Overall Cognitive Status: Impaired/Different from baseline Area of Impairment: Orientation, Attention, Memory Orientation Level: Situation, Place General Comments: pt groggy, she opened eyes and responded to questions for short periods, when asked location she stated "MRI", she was able to follow simple commands  Blood pressure (!) 170/76, pulse 66, temperature 97.7 F (36.5 C), temperature source Axillary, resp. rate 20, height 5\' 6"  (1.676 m), weight 74.8 kg (165 lb), SpO2 93 %. Physical Exam  Constitutional: She appears well-developed.  HENT:  Head: Normocephalic.  Eyes:  Pupils round and reactive to light  Neck: Normal range of motion. Neck supple. No thyromegaly present.  Cardiovascular: Normal rate and regular rhythm.   Respiratory: Effort normal and breath sounds normal. No respiratory distress.  GI: Soft. Bowel sounds are normal. She exhibits no distension.  Neurological: She is alert.  Mood is a bit flat but appropriate. She appears to have a right gaze preference. Some delay in processing but was able to provide  her name age and date of birth. Follows simple commands. Is able to locate objects in all visual fields but is slower to do so in the LLQ and to some extent the LUQ. Has difficulties with processing and coordinating activities involving the left arm and leg. Apraxic. Seems to sense pain in all 4. Motor 4/5 to 5/5 in all 4. Oriented to hospital, month and why she's here.  Skin: Skin is warm and dry.  Psychiatric: She has a normal mood and affect. Her behavior is normal.    Lab Results Last 24 Hours       Results for orders placed or performed during the hospital encounter of 04/22/16 (from the past 24 hour(s))  Glucose, capillary     Status: Abnormal   Collection Time: 04/23/16 11:24 AM  Result Value Ref Range   Glucose-Capillary 145 (H) 65 - 99 mg/dL   Comment 1 Notify RN    Comment 2 Document in Chart   Glucose, capillary     Status: Abnormal   Collection Time: 04/23/16  4:05 PM  Result Value Ref Range   Glucose-Capillary 133 (H) 65 - 99 mg/dL   Comment 1 Notify RN    Comment 2 Document in Chart   Glucose, capillary     Status: Abnormal   Collection Time: 04/23/16 10:09 PM  Result Value Ref Range   Glucose-Capillary 158 (H) 65 - 99 mg/dL   Comment 1 Notify RN    Comment 2 Document in Chart   Glucose, capillary     Status: Abnormal   Collection Time: 04/24/16  6:08 AM  Result Value Ref Range   Glucose-Capillary 130 (H) 65 - 99 mg/dL   Comment 1 Notify RN    Comment 2 Document in Chart       Imaging Results (Last 48 hours)  Ct Angio Head W Or Wo Contrast  Result Date: 04/23/2016 CLINICAL DATA:  Stroke, follow-up evaluation. Found on floor. Recent gait imbalance. Clinical concern for acute RIGHT PCA territory infarct. EXAM: CT ANGIOGRAPHY HEAD AND NECK TECHNIQUE: Multidetector CT imaging of the head and neck was performed using the standard protocol during bolus administration of intravenous contrast. Multiplanar CT image reconstructions and MIPs were  obtained to evaluate the vascular anatomy. Carotid stenosis measurements (when applicable) are obtained utilizing NASCET criteria, using the distal internal carotid diameter as the denominator. CONTRAST:  50 cc Isovue 370 COMPARISON:  CT HEAD April 22, 2016 FINDINGS: CTA NECK AORTIC ARCH: Normal appearance of the thoracic arch, 2 vessel arch. Moderate intimal thickening calcific atherosclerosis of the aortic arch and LEFT subclavian artery origin. The origins of the innominate, left Common carotid artery and subclavian artery are widely patent. RIGHT CAROTID SYSTEM: Common carotid artery is widely patent, coursing in a straight line fashion. Normal appearance of the carotid bifurcation without hemodynamically significant stenosis by NASCET criteria. Mild eccentric calcific atherosclerosis. Normal appearance of the included internal carotid artery. LEFT CAROTID SYSTEM: Common carotid artery is widely patent,  coursing in a straight line fashion. Mild eccentric intimal thickening. Normal appearance of the carotid bifurcation without hemodynamically significant stenosis by NASCET criteria. Mild to moderate eccentric calcific atherosclerosis. Normal appearance of the included internal carotid artery. VERTEBRAL ARTERIES:Left vertebral artery is dominant. Normal appearance of the vertebral arteries, which appear widely patent. SKELETON: No acute osseous process though bone windows have not been submitted. Moderate C5-6 and C6-7 degenerative discs. OTHER NECK: Soft tissues of the neck are non-acute though, not tailored for evaluation. RIGHT thyroidectomy. Enlarged heterogeneous LEFT thyroid without dominant nodule. CTA HEAD ANTERIOR CIRCULATION: Normal appearance of the cervical internal carotid arteries, petrous, cavernous and supra clinoid internal carotid arteries. 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 Widely patent anterior  communicating artery. Patent anterior and middle cerebral arteries with mild luminal regularity. No large vessel occlusion, hemodynamically significant stenosis, dissection, luminal irregularity, contrast extravasation or aneurysm. POSTERIOR CIRCULATION: Normal appearance of the vertebral arteries, vertebrobasilar junction and basilar artery, as well as main branch vessels. Patent posterior cerebral arteries. Moderate tandem stenoses bilateral posterior cerebral arteries. No large vessel occlusion, dissection, luminal irregularity, contrast extravasation or aneurysm. VENOUS SINUSES: Major dural venous sinuses are patent though not tailored for evaluation on this angiographic examination. ANATOMIC VARIANTS: None. DELAYED PHASE: No abnormal intracranial enhancement. Review of the MIP images confirms the above findings IMPRESSION: CTA NECK: No hemodynamically significant stenosis or acute vascular process. CTA HEAD:  No emergent large vessel occlusion. 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 Moderate luminal irregularity of the bilateral posterior cerebral arteries compatible with atherosclerosis. Electronically Signed   By: Elon Alas M.D.   On: 04/23/2016 19:39   Dg Chest 1 View  Result Date: 04/22/2016 CLINICAL DATA:  Confusion, severe headache today. Fell yesterday. Found underneath bed today. EXAM: CHEST 1 VIEW COMPARISON:  Chest radiograph June 29, 2006 FINDINGS: Cardiac silhouette is mildly enlarged. Mildly tortuous and atherosclerotic aorta. Strandy densities LEFT lung. No pleural effusion or focal consolidation. No pneumothorax. Osteopenia. Soft tissue planes are unremarkable. IMPRESSION: Mild cardiomegaly.  Similar LEFT lung scarring. Electronically Signed   By: Elon Alas M.D.   On: 04/22/2016 20:35   Ct Head Wo Contrast  Result Date: 04/22/2016 CLINICAL DATA:  Found on floor, previous fall with head injury  EXAM: CT HEAD WITHOUT CONTRAST CT CERVICAL SPINE WITHOUT CONTRAST TECHNIQUE: Multidetector CT imaging of the head and cervical spine was performed following the standard protocol without intravenous contrast. Multiplanar CT image reconstructions of the cervical spine were also generated. COMPARISON:  MRI 07/24/2015 FINDINGS: CT HEAD FINDINGS Brain: No acute territorial infarction, intracranial hemorrhage or extra-axial fluid collection is seen. Mild atrophy. Moderate periventricular, subcortical and deep white matter hypodensities consistent with small vessel disease. Decreased density in the pons, felt to correlate with abnormal small vessel changes on prior MRI. Heterogenous density in the bilateral thalamus, also likely relate to mild signal abnormality noted on T2 images. Vascular: No hyperdense vessels. There are carotid artery calcifications. There are vertebral artery calcifications. Skull: Mastoid air cells are clear.  There is no skull fracture Sinuses/Orbits: Mild mucosal thickening in the ethmoid sinuses. No acute orbital abnormality. Other: None CT CERVICAL SPINE FINDINGS Alignment: No subluxation.  Facet alignment is maintained. Skull base and vertebrae: Craniovertebral junction appears intact. Vertebral body heights are maintained. There is no fracture identified. Soft tissues and spinal canal: No prevertebral fluid or swelling. No visible canal hematoma. Disc levels: Moderate degenerative disc changes  at C5-C6 and C6-C7 with endplate changes and osteophytes. Multilevel facet arthropathy. No significant foraminal stenosis. Upper chest: Lung apices clear. Enlarged left lobe of thyroid gland. Other: Carotid artery calcifications. IMPRESSION: 1. No definite CT evidence for acute intracranial abnormality. Moderate periventricular, subcortical and deep white matter small vessel disease. 2. No acute fracture or malalignment of the cervical spine. Moderate degenerative disc changes most notable at C5-C6 and  C6-C7. Electronically Signed   By: Donavan Foil M.D.   On: 04/22/2016 20:29   Ct Angio Neck W Or Wo Contrast  Result Date: 04/23/2016 CLINICAL DATA:  Stroke, follow-up evaluation. Found on floor. Recent gait imbalance. Clinical concern for acute RIGHT PCA territory infarct. EXAM: CT ANGIOGRAPHY HEAD AND NECK TECHNIQUE: Multidetector CT imaging of the head and neck was performed using the standard protocol during bolus administration of intravenous contrast. Multiplanar CT image reconstructions and MIPs were obtained to evaluate the vascular anatomy. Carotid stenosis measurements (when applicable) are obtained utilizing NASCET criteria, using the distal internal carotid diameter as the denominator. CONTRAST:  50 cc Isovue 370 COMPARISON:  CT HEAD April 22, 2016 FINDINGS: CTA NECK AORTIC ARCH: Normal appearance of the thoracic arch, 2 vessel arch. Moderate intimal thickening calcific atherosclerosis of the aortic arch and LEFT subclavian artery origin. The origins of the innominate, left Common carotid artery and subclavian artery are widely patent. RIGHT CAROTID SYSTEM: Common carotid artery is widely patent, coursing in a straight line fashion. Normal appearance of the carotid bifurcation without hemodynamically significant stenosis by NASCET criteria. Mild eccentric calcific atherosclerosis. Normal appearance of the included internal carotid artery. LEFT CAROTID SYSTEM: Common carotid artery is widely patent, coursing in a straight line fashion. Mild eccentric intimal thickening. Normal appearance of the carotid bifurcation without hemodynamically significant stenosis by NASCET criteria. Mild to moderate eccentric calcific atherosclerosis. Normal appearance of the included internal carotid artery. VERTEBRAL ARTERIES:Left vertebral artery is dominant. Normal appearance of the vertebral arteries, which appear widely patent. SKELETON: No acute osseous process though bone windows have not been submitted.  Moderate C5-6 and C6-7 degenerative discs. OTHER NECK: Soft tissues of the neck are non-acute though, not tailored for evaluation. RIGHT thyroidectomy. Enlarged heterogeneous LEFT thyroid without dominant nodule. CTA HEAD ANTERIOR CIRCULATION: Normal appearance of the cervical internal carotid arteries, petrous, cavernous and supra clinoid internal carotid arteries. 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 Widely patent anterior communicating artery. Patent anterior and middle cerebral arteries with mild luminal regularity. No large vessel occlusion, hemodynamically significant stenosis, dissection, luminal irregularity, contrast extravasation or aneurysm. POSTERIOR CIRCULATION: Normal appearance of the vertebral arteries, vertebrobasilar junction and basilar artery, as well as main branch vessels. Patent posterior cerebral arteries. Moderate tandem stenoses bilateral posterior cerebral arteries. No large vessel occlusion, dissection, luminal irregularity, contrast extravasation or aneurysm. VENOUS SINUSES: Major dural venous sinuses are patent though not tailored for evaluation on this angiographic examination. ANATOMIC VARIANTS: None. DELAYED PHASE: No abnormal intracranial enhancement. Review of the MIP images confirms the above findings IMPRESSION: CTA NECK: No hemodynamically significant stenosis or acute vascular process. CTA HEAD:  No emergent large vessel occlusion. 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 Moderate luminal irregularity of the bilateral posterior cerebral arteries compatible with atherosclerosis. Electronically Signed   By: Elon Alas M.D.   On: 04/23/2016 19:39   Ct Cervical Spine Wo Contrast  Result Date: 04/22/2016 CLINICAL DATA:  Found on floor, previous fall with head injury EXAM:  CT HEAD WITHOUT CONTRAST CT CERVICAL  SPINE WITHOUT CONTRAST TECHNIQUE: Multidetector CT imaging of the head and cervical spine was performed following the standard protocol without intravenous contrast. Multiplanar CT image reconstructions of the cervical spine were also generated. COMPARISON:  MRI 07/24/2015 FINDINGS: CT HEAD FINDINGS Brain: No acute territorial infarction, intracranial hemorrhage or extra-axial fluid collection is seen. Mild atrophy. Moderate periventricular, subcortical and deep white matter hypodensities consistent with small vessel disease. Decreased density in the pons, felt to correlate with abnormal small vessel changes on prior MRI. Heterogenous density in the bilateral thalamus, also likely relate to mild signal abnormality noted on T2 images. Vascular: No hyperdense vessels. There are carotid artery calcifications. There are vertebral artery calcifications. Skull: Mastoid air cells are clear.  There is no skull fracture Sinuses/Orbits: Mild mucosal thickening in the ethmoid sinuses. No acute orbital abnormality. Other: None CT CERVICAL SPINE FINDINGS Alignment: No subluxation.  Facet alignment is maintained. Skull base and vertebrae: Craniovertebral junction appears intact. Vertebral body heights are maintained. There is no fracture identified. Soft tissues and spinal canal: No prevertebral fluid or swelling. No visible canal hematoma. Disc levels: Moderate degenerative disc changes at C5-C6 and C6-C7 with endplate changes and osteophytes. Multilevel facet arthropathy. No significant foraminal stenosis. Upper chest: Lung apices clear. Enlarged left lobe of thyroid gland. Other: Carotid artery calcifications. IMPRESSION: 1. No definite CT evidence for acute intracranial abnormality. Moderate periventricular, subcortical and deep white matter small vessel disease. 2. No acute fracture or malalignment of the cervical spine. Moderate degenerative disc changes most notable at C5-C6 and C6-C7. Electronically Signed   By: Donavan Foil M.D.   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 Does the need for close, 24 hr/day medical supervision in concert with the patient's rehab needs make it unreasonable for this patient to be served in a less intensive setting? Yes 2. Co-Morbidities requiring supervision/potential complications: DM, HTN,  3. Due to bladder management, bowel management, safety, skin/wound care, disease management, medication administration, pain management and patient education, does the patient require 24 hr/day rehab nursing? Yes 4. Does the patient require coordinated care of a physician, rehab nurse, PT (1-2 hrs/day, 5 days/week), OT (1-2 hrs/day, 5 days/week) and SLP (1-2 hrs/day, 5 days/week) to address physical and functional deficits in the context of the above medical diagnosis(es)? Yes Addressing deficits in the following areas: balance, endurance, locomotion, strength, transferring, bowel/bladder control, bathing, dressing, feeding, grooming, toileting, cognition and psychosocial support 5. Can the patient actively participate in an intensive therapy program of at least 3 hrs of therapy per day at least 5 days per week? Yes 6. The potential for patient to make measurable gains while on inpatient rehab is excellent 7. Anticipated functional outcomes upon discharge from inpatient rehab are modified independent and supervision  with PT, modified independent and supervision with OT, modified independent with SLP. 8. Estimated rehab length of stay to reach the above functional goals is: 12-16 days 9. Does the patient have adequate social supports and living environment to accommodate these discharge functional goals? Yes 10. Anticipated D/C setting: Home 11. Anticipated post D/C treatments: HH therapy and Outpatient therapy 12. Overall Rehab/Functional Prognosis: excellent  RECOMMENDATIONS: This  patient's condition is appropriate for continued rehabilitative care in the following setting: CIR Patient has agreed to participate in recommended program. Yes Note that insurance prior authorization may be required for reimbursement for recommended care.  Comment: Rehab Admissions Coordinator  to follow up.  Thanks,  Meredith Staggers, MD, [facility]     04/24/2016    Revision History                        Routing History

## 2016-04-25 NOTE — Progress Notes (Signed)
Inpatient Rehabilitation  I have insurance authorization and plan to admit patient pending completion of 2D echo.  Please call with questions.     Carmelia Roller., CCC/SLP Admission Coordinator  [facility]  Cell [phone]

## 2016-04-25 NOTE — Care Management Important Message (Signed)
Important Message  Patient Details  Name: Amy [name] MRN: [id number] Date of Birth: 1930-03-04   Medicare Important Message Given:  Yes    Gabor Lusk Abena 04/25/2016, 1:05 PM

## 2016-04-25 NOTE — Progress Notes (Signed)
Patient is being transferred to 80 MW, report called to the receiving nurse.

## 2016-04-25 NOTE — H&P (Signed)
Expand All Collapse All   [] Hide copied text    Physical Medicine and Rehabilitation Admission H&P       Chief Complaint  Patient presents with  . Fall  : HPI: Amy [name] a 82 y.o.right handed femalewith history of diabetes mellitus, hypertension. Per chart review patient lives alone and was independent and still driving short distances prior to admission. Presented 04/22/2016 with altered mental status as well as decreased vision on the left. Daughter had reported she had difficulty with her balance for the past several weeks as well as occasional incontinence of her bowel. Cranial CT scan as well as CT cervical spine negative for acute changes. There was moderate periventricular, subcortical and deep white matter small vessel disease identified on cranial CT. Patient did not receive TPA. CTA of head and neck with no significant stenosis or large vessel occlusion.MRI of the brain 04/24/2016 showed no acute findings or changes compared to February 2017. Microhemorrhages in the cortex congestive of possible amyloid angiopathy. Carotid Doppler showed no ICA stenosis.EEG showed no seizure activity. She remained on Keppra for seizure prophylaxis. Echocardiogram pending. Neurology consulted suspect right brain infarct with workup currently ongoing maintained on aspirin for CVA prophylaxis as well as subcutaneous Lovenox for DVT prophylaxis. Tolerating a regular consistency diet. Physical therapy evaluation completed with recommendations of physical medicine rehabilitation consult.Patient was admitted for a comprehensive rehabilitation program  Review of Systems  Constitutional: Negative for chills and fever.  HENT: Negative for hearing loss and tinnitus.   Eyes: Positive for blurred vision. Negative for double vision.  Respiratory: Negative for cough, shortness of breath and wheezing.   Cardiovascular: Negative for chest pain, palpitations and leg swelling.  Gastrointestinal: Negative  for constipation.       Occassional bowel incontinence  Genitourinary: Negative for dysuria, flank pain and hematuria.  Musculoskeletal: Positive for joint pain and myalgias.  Skin: Negative for itching and rash.  Neurological: Positive for dizziness and weakness. Negative for seizures.  All other systems reviewed and are negative.      Past Medical History:  Diagnosis Date  . ABSCESS 12/18/2009  . DIABETES MELLITUS, TYPE II 05/22/2009  . Diverticulosis   . Hemorrhoids   . HYPERGLYCEMIA 05/22/2009  . HYPERTENSION 04/13/2007  . HYPOTHYROIDISM 04/13/2007  . NEOPLASM, SKIN, UNCERTAIN BEHAVIOR [id number]  . Tubular adenoma of colon 02/2001        Past Surgical History:  Procedure Laterality Date  . ABDOMINAL HYSTERECTOMY    . APPENDECTOMY    . CHOLECYSTECTOMY    . COLONOSCOPY  04-05-04   per Dr. Deatra Ina, no polyps   . OOPHORECTOMY    . THYROIDECTOMY     secondary to nodule, no cancer  . TONSILLECTOMY AND ADENOIDECTOMY          Family History  Problem Relation Age of Onset  . Stroke Father   . Hypertension Mother   . Huntington's disease Daughter 19  . Stroke Sister   . Colon cancer Neg Hx    Social History:  reports that she has never smoked. She has never used smokeless tobacco. She reports that she does not drink alcohol or use drugs. Allergies:       Allergies  Allergen Reactions  . Piroxicam Other (See Comments)    REACTION: "SEVERE ALLERGY REACTION"  . Ativan [Lorazepam] Other (See Comments)    Caused delirium, confusion, disorientation, slightly increased agitation.  . Diclofenac Sodium Nausea And Vomiting  . Latex Rash    ?  Marland Kitchen Tape Rash    ?  Medications Prior to Admission  Medication Sig Dispense Refill  . amLODipine (NORVASC) 5 MG tablet TAKE ONE TABLET BY MOUTH ONCE DAILY (Patient not taking: Reported on 04/23/2016) 90 tablet 3  . [START ON 04/26/2016] aspirin 325 MG tablet Take 1 tablet (325 mg total) by mouth  daily.    Marland Kitchen atorvastatin (LIPITOR) 40 MG tablet Take 1 tablet (40 mg total) by mouth daily.    Marland Kitchen glipiZIDE (GLUCOTROL) 10 MG tablet Take 10 mg by mouth 2 (two) times daily before a meal.    . insulin aspart (NOVOLOG) 100 UNIT/ML injection 0-15 Units, Subcutaneous, 3 times daily with meals CBG < 70: implement hypoglycemia protocol CBG 70 - 120: 0 units CBG 121 - 150: 2 units CBG 151 - 200: 3 units CBG 201 - 250: 5 units CBG 251 - 300: 8 units CBG 301 - 350: 11 units CBG 351 - 400: 15 units CBG > 400: call MD 10 mL 11  . [START ON 04/26/2016] levETIRAcetam (KEPPRA XR) 500 MG 24 hr tablet Take 1 tablet (500 mg total) by mouth daily.    Marland Kitchen levothyroxine (SYNTHROID) 175 MCG tablet Take 1 tablet (175 mcg total) by mouth daily before breakfast. 30 tablet 11  . metFORMIN (GLUCOPHAGE) 1000 MG tablet Take 1 tablet (1,000 mg total) by mouth 2 (two) times daily with a meal. 180 tablet 3  . omeprazole (PRILOSEC) 40 MG capsule Take 1 capsule (40 mg total) by mouth daily. (Patient taking differently: Take 40 mg by mouth daily as needed (heartburn). ) 30 capsule 11  . sertraline (ZOLOFT) 100 MG tablet Take 1 tablet (100 mg total) by mouth daily. 30 tablet 5    Home: Home Living Family/patient expects to be discharged to:: Private residence Living Arrangements: Alone Available Help at Discharge: Family, Friend(s) Type of Home: House Home Access: Stairs to enter Technical brewer of Steps: 1 Home Layout: One level Bathroom Shower/Tub: Multimedia programmer: Standard Home Equipment: Environmental consultant - 2 wheels  Lives With: Alone   Functional History: Prior Function Level of Independence: Independent Comments: family reports pt has had several falls in past year and has had concerns about her safety; pt drove and did her own shopping PTA, son stating she was only supposed to drive short distances  Functional Status:  Mobility: Bed Mobility Overal bed mobility: +2 for physical  assistance, Needs Assistance Bed Mobility: Supine to Sit, Sit to Supine Supine to sit: +2 for physical assistance, Mod assist Sit to supine: +2 for physical assistance, Max assist General bed mobility comments: very inefficient movement, requiring assist for trunk and LEs Transfers Overall transfer level: Needs assistance Transfers: Sit to/from Stand Sit to Stand: +2 physical assistance, Mod assist General transfer comment: pt stood from bed x 20 seconds, assist to rise and steady and to place L hand on walker, pt with nausea, requiring return to sitting       ADL: ADL Overall ADL's : Needs assistance/impaired Eating/Feeding: Minimal assistance, Bed level Grooming: Wash/dry face, Sitting, Min guard Upper Body Bathing: Maximal assistance, Sitting Lower Body Bathing: +2 for physical assistance, Total assistance, Sit to/from stand Upper Body Dressing : Moderate assistance, Sitting Lower Body Dressing: +2 for physical assistance, Total assistance, Sit to/from stand Overall ADL's : Needs assistance/impaired Eating/Feeding: Minimal assistance;Bed level  Grooming: Wash/dry face;Sitting;Min guard  Upper Body Bathing: Maximal assistance;Sitting  Lower Body Bathing: +2 for physical assistance;Total assistance;Sit to/from stand  Upper Body Dressing : Moderate assistance;Sitting  Lower Body Dressing: +2 for physical assistance;Total assistance;Sit  to/from stand  Cognition: Cognition Overall Cognitive Status: Impaired/Different from baseline Arousal/Alertness: Lethargic Orientation Level: Oriented X4 Attention: Sustained Sustained Attention: Impaired Sustained Attention Impairment: Verbal basic, Functional basic Memory: Impaired Memory Impairment: Decreased recall of new information Awareness: Impaired Awareness Impairment: Intellectual impairment Problem Solving: Appears intact (for basic information unrelated to deficits) Safety/Judgment: Impaired Comments: decreased  safety awareness related to deficits Cognition Arousal/Alertness: Awake/alert Behavior During Therapy: WFL for tasks assessed/performed Overall Cognitive Status: Impaired/Different from baseline Area of Impairment: Attention, Memory, Following commands, Safety/judgement, Awareness Orientation Level: Situation, Place Current Attention Level: Focused (highly distractable) Memory: Decreased short-term memory Following Commands: Follows one step commands with increased time (and multimodal cues) Safety/Judgement: Decreased awareness of safety, Decreased awareness of deficits Awareness:  (pre-intellectual) General Comments: Per family discussion, pt with poor awareness of deficits prior to this incident  Physical Exam: Blood pressure (!) 147/73, pulse 72, temperature 98.3 F (36.8 C), temperature source Oral, resp. rate 18, height 5\' 6"  (1.676 m), weight 74.8 kg (165 lb), SpO2 94 %. Physical Exam  Constitutional: She appears well-developed and well-nourished.  HENT:  Head: Normocephalic.  Right Ear: External ear normal.  Left Ear: External ear normal.  Eyes: EOM are normal. Pupils are equal, round, and reactive to light.  Pupils round and reactive to light  Neck: Normal range of motion. Neck supple. No tracheal deviation present. No thyromegaly present.  Cardiovascular: Normal rate, regular rhythm and normal heart sounds.   Respiratory: Effort normal and breath sounds normal. No respiratory distress. She has no wheezes.  GI: Soft. Bowel sounds are normal. She exhibits no distension. There is no tenderness. There is no rebound.  Musculoskeletal: She exhibits no edema.  Neurological: She is alert.  Alert. Oriented to self, reason she's here, place. Had difficulties with date. Still with inattention to left but improving. Can see to all 4 quadrants, but more limited in the LLQ. Remains somewhat Apraxic. Seems to sense pain in all 4. Motor 4/5 to 5/5 in all 4 limbs proximal to distal. .senses  pain in all 4 limbs. DTR's are 1+.  Skin: Skin is warmand dry.  Psychiatric: pleasnt and cooperative  Lab Results Last 48 Hours       Results for orders placed or performed during the hospital encounter of 04/22/16 (from the past 48 hour(s))  Glucose, capillary     Status: Abnormal   Collection Time: 04/23/16 10:09 PM  Result Value Ref Range   Glucose-Capillary 158 (H) 65 - 99 mg/dL   Comment 1 Notify RN    Comment 2 Document in Chart   Glucose, capillary     Status: Abnormal   Collection Time: 04/24/16  6:08 AM  Result Value Ref Range   Glucose-Capillary 130 (H) 65 - 99 mg/dL   Comment 1 Notify RN    Comment 2 Document in Chart   Glucose, capillary     Status: Abnormal   Collection Time: 04/24/16  4:41 PM  Result Value Ref Range   Glucose-Capillary 163 (H) 65 - 99 mg/dL   Comment 1 Notify RN    Comment 2 Document in Chart   Glucose, capillary     Status: Abnormal   Collection Time: 04/24/16  9:31 PM  Result Value Ref Range   Glucose-Capillary 158 (H) 65 - 99 mg/dL   Comment 1 Notify RN    Comment 2 Document in Chart   Glucose, capillary     Status: Abnormal   Collection Time: 04/25/16  6:27 AM  Result Value Ref Range  Glucose-Capillary 153 (H) 65 - 99 mg/dL   Comment 1 Notify RN    Comment 2 Document in Chart   Glucose, capillary     Status: Abnormal   Collection Time: 04/25/16 11:13 AM  Result Value Ref Range   Glucose-Capillary 181 (H) 65 - 99 mg/dL   Comment 1 Notify RN    Comment 2 Document in Chart   Glucose, capillary     Status: Abnormal   Collection Time: 04/25/16  4:55 PM  Result Value Ref Range   Glucose-Capillary 130 (H) 65 - 99 mg/dL   Comment 1 Notify RN    Comment 2 Document in Chart       Imaging Results (Last 48 hours)  Ct Angio Head W Or Wo Contrast  Result Date: 04/23/2016 CLINICAL DATA:  Stroke, follow-up evaluation. Found on floor. Recent gait imbalance. Clinical concern for acute RIGHT PCA  territory infarct. EXAM: CT ANGIOGRAPHY HEAD AND NECK TECHNIQUE: Multidetector CT imaging of the head and neck was performed using the standard protocol during bolus administration of intravenous contrast. Multiplanar CT image reconstructions and MIPs were obtained to evaluate the vascular anatomy. Carotid stenosis measurements (when applicable) are obtained utilizing NASCET criteria, using the distal internal carotid diameter as the denominator. CONTRAST:  50 cc Isovue 370 COMPARISON:  CT HEAD April 22, 2016 FINDINGS: CTA NECK AORTIC ARCH: Normal appearance of the thoracic arch, 2 vessel arch. Moderate intimal thickening calcific atherosclerosis of the aortic arch and LEFT subclavian artery origin. The origins of the innominate, left Common carotid artery and subclavian artery are widely patent. RIGHT CAROTID SYSTEM: Common carotid artery is widely patent, coursing in a straight line fashion. Normal appearance of the carotid bifurcation without hemodynamically significant stenosis by NASCET criteria. Mild eccentric calcific atherosclerosis. Normal appearance of the included internal carotid artery. LEFT CAROTID SYSTEM: Common carotid artery is widely patent, coursing in a straight line fashion. Mild eccentric intimal thickening. Normal appearance of the carotid bifurcation without hemodynamically significant stenosis by NASCET criteria. Mild to moderate eccentric calcific atherosclerosis. Normal appearance of the included internal carotid artery. VERTEBRAL ARTERIES:Left vertebral artery is dominant. Normal appearance of the vertebral arteries, which appear widely patent. SKELETON: No acute osseous process though bone windows have not been submitted. Moderate C5-6 and C6-7 degenerative discs. OTHER NECK: Soft tissues of the neck are non-acute though, not tailored for evaluation. RIGHT thyroidectomy. Enlarged heterogeneous LEFT thyroid without dominant nodule. CTA HEAD ANTERIOR CIRCULATION: Normal appearance of  the cervical internal carotid arteries, petrous, cavernous and supra clinoid internal carotid arteries. 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 Widely patent anterior communicating artery. Patent anterior and middle cerebral arteries with mild luminal regularity. No large vessel occlusion, hemodynamically significant stenosis, dissection, luminal irregularity, contrast extravasation or aneurysm. POSTERIOR CIRCULATION: Normal appearance of the vertebral arteries, vertebrobasilar junction and basilar artery, as well as main branch vessels. Patent posterior cerebral arteries. Moderate tandem stenoses bilateral posterior cerebral arteries. No large vessel occlusion, dissection, luminal irregularity, contrast extravasation or aneurysm. VENOUS SINUSES: Major dural venous sinuses are patent though not tailored for evaluation on this angiographic examination. ANATOMIC VARIANTS: None. DELAYED PHASE: No abnormal intracranial enhancement. Review of the MIP images confirms the above findings IMPRESSION: CTA NECK: No hemodynamically significant stenosis or acute vascular process. CTA HEAD:  No emergent large vessel occlusion. 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 Moderate luminal irregularity of the bilateral posterior cerebral arteries compatible with atherosclerosis.  Electronically Signed   By: Elon Alas M.D.   On: 04/23/2016 19:39   Ct Angio Neck W Or Wo Contrast  Result Date: 04/23/2016 CLINICAL DATA:  Stroke, follow-up evaluation. Found on floor. Recent gait imbalance. Clinical concern for acute RIGHT PCA territory infarct. EXAM: CT ANGIOGRAPHY HEAD AND NECK TECHNIQUE: Multidetector CT imaging of the head and neck was performed using the standard protocol during bolus administration of intravenous contrast. Multiplanar CT image reconstructions and  MIPs were obtained to evaluate the vascular anatomy. Carotid stenosis measurements (when applicable) are obtained utilizing NASCET criteria, using the distal internal carotid diameter as the denominator. CONTRAST:  50 cc Isovue 370 COMPARISON:  CT HEAD April 22, 2016 FINDINGS: CTA NECK AORTIC ARCH: Normal appearance of the thoracic arch, 2 vessel arch. Moderate intimal thickening calcific atherosclerosis of the aortic arch and LEFT subclavian artery origin. The origins of the innominate, left Common carotid artery and subclavian artery are widely patent. RIGHT CAROTID SYSTEM: Common carotid artery is widely patent, coursing in a straight line fashion. Normal appearance of the carotid bifurcation without hemodynamically significant stenosis by NASCET criteria. Mild eccentric calcific atherosclerosis. Normal appearance of the included internal carotid artery. LEFT CAROTID SYSTEM: Common carotid artery is widely patent, coursing in a straight line fashion. Mild eccentric intimal thickening. Normal appearance of the carotid bifurcation without hemodynamically significant stenosis by NASCET criteria. Mild to moderate eccentric calcific atherosclerosis. Normal appearance of the included internal carotid artery. VERTEBRAL ARTERIES:Left vertebral artery is dominant. Normal appearance of the vertebral arteries, which appear widely patent. SKELETON: No acute osseous process though bone windows have not been submitted. Moderate C5-6 and C6-7 degenerative discs. OTHER NECK: Soft tissues of the neck are non-acute though, not tailored for evaluation. RIGHT thyroidectomy. Enlarged heterogeneous LEFT thyroid without dominant nodule. CTA HEAD ANTERIOR CIRCULATION: Normal appearance of the cervical internal carotid arteries, petrous, cavernous and supra clinoid internal carotid arteries. 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 Widely  patent anterior communicating artery. Patent anterior and middle cerebral arteries with mild luminal regularity. No large vessel occlusion, hemodynamically significant stenosis, dissection, luminal irregularity, contrast extravasation or aneurysm. POSTERIOR CIRCULATION: Normal appearance of the vertebral arteries, vertebrobasilar junction and basilar artery, as well as main branch vessels. Patent posterior cerebral arteries. Moderate tandem stenoses bilateral posterior cerebral arteries. No large vessel occlusion, dissection, luminal irregularity, contrast extravasation or aneurysm. VENOUS SINUSES: Major dural venous sinuses are patent though not tailored for evaluation on this angiographic examination. ANATOMIC VARIANTS: None. DELAYED PHASE: No abnormal intracranial enhancement. Review of the MIP images confirms the above findings IMPRESSION: CTA NECK: No hemodynamically significant stenosis or acute vascular process. CTA HEAD:  No emergent large vessel occlusion. 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 Moderate luminal irregularity of the bilateral posterior cerebral arteries compatible with atherosclerosis. Electronically Signed   By: Elon Alas M.D.   On: 04/23/2016 19:39   Mr Brain Wo Contrast  Result Date: 04/24/2016 CLINICAL DATA:  Stroke.  Found on floor. EXAM: MRI HEAD WITHOUT CONTRAST TECHNIQUE: Multiplanar, multiecho pulse sequences of the brain and surrounding structures were obtained without intravenous contrast. COMPARISON:  07/24/2015 FINDINGS: Brain: No acute infarction, hemorrhage, hydrocephalus, extra-axial collection or mass lesion. Confluent gliosis in the cerebral white matter and patchy in the pons, nonspecific but consistent with chronic microvascular disease given patient's multiple vascular risk factors. Remote small vessel infarct in the peripheral left cerebellum. Chronic hemorrhagic foci were better seen  on  previous 3 T exam using SMI technique. Hypertensive pattern hemorrhages are seen in the bilateral deep gray nuclei and pons. There are also scattered cortical chronic hemorrhagic foci, with predilection for the posterior right cerebral hemisphere. The lobar hemorrhages imply superimposed amyloid angiopathy or previous trauma. Vascular: Preserved flow voids Skull and upper cervical spine: Negative Sinuses/Orbits: Bilateral cataract resection. IMPRESSION: 1. No acute finding or change from February 2017. 2. Advanced chronic microvascular disease and remote hypertensive micro hemorrhages. 3. Multiple remote cortical hemorrhages, possible superimposed amyloid angiopathy. Electronically Signed   By: Monte Fantasia M.D.   On: 04/24/2016 12:50        Medical Problem List and Plan: 1.  Decreased functional mobility with balance deficits processing deficits as well as apraxia secondary to small right cortical brain infarct/?seizure             -admit to inpatient rehab 2.  DVT Prophylaxis/Anticoagulation: Subcutaneous Lovenox. Monitor platelet counts and any signs of bleeding 3. Pain Management: Tylenol as needed 4. Mood: Zoloft 100mg  daily 5. Neuropsych: This patient is capable of making decisions on her own behalf. 6. Skin/Wound Care: Routine skin checks 7. Fluids/Electrolytes/Nutrition: Routine I&O with follow-up chemistries during admit 8. Seizure prophylaxis. Keppra-XR 500 mg daily 9.. Diabetes mellitus with peripheral neuropathy. Hemoglobin A1c 7.1. Patient on Glucotrol 10 mg twice a day, Glucophage 1000 mg twice a day prior to admission. Resume as needed 11. Hypertension. No current antihypertensive medication. Patient on Norvasc 5 mg daily prior to admission. Resume as needed 12. Hypothyroidism. Synthroid 13. Hyperlipidemia. Lipitor  Post Admission Physician Evaluation: 1. Functional deficits secondary  to small right cortical infarct/?seizure. 2. Patient is admitted to receive  collaborative, interdisciplinary care between the physiatrist, rehab nursing staff, and therapy team. 3. Patient's level of medical complexity and substantial therapy needs in context of that medical necessity cannot be provided at a lesser intensity of care such as a SNF. 4. Patient has experienced substantial functional loss from his/her baseline which was documented above under the "Functional History" and "Functional Status" headings.  Judging by the patient's diagnosis, physical exam, and functional history, the patient has potential for functional progress which will result in measurable gains while on inpatient rehab.  These gains will be of substantial and practical use upon discharge  in facilitating mobility and self-care at the household level. 5. Physiatrist will provide 24 hour management of medical needs as well as oversight of the therapy plan/treatment and provide guidance as appropriate regarding the interaction of the two. 6. The Preadmission Screening has been reviewed and patient status is unchanged unless otherwise stated above. 7. 24 hour rehab nursing will assist with bladder management, bowel management, safety, skin/wound care, disease management, medication administration and patient education  and help integrate therapy concepts, techniques,education, etc. 8. PT will assess and treat for/with: Lower extremity strength, range of motion, stamina, balance, functional mobility, safety, adaptive techniques and equipment, NMR, visual-spatial awareness, family education.   Goals are: mod I to supervision. 9. OT will assess and treat for/with: ADL's, functional mobility, safety, upper extremity strength, adaptive techniques and equipment, NMR, visual-spatial awareness, family education, community reintegration.   Goals are: mod I to supervision. Therapy may proceed with showering this patient. 10. SLP will assess and treat for/with: cognition, family education.  Goals are: mod I to  supervision. 11. Case Management and Social Worker will assess and treat for psychological issues and discharge planning. 12. Team conference will be held weekly to assess progress toward goals and to  determine barriers to discharge. 13. Patient will receive at least 3 hours of therapy per day at least 5 days per week. 14. ELOS: 11-17 days      15. Prognosis:  excellent     Meredith Staggers, MD, [place] [facility] 04/25/2016

## 2016-04-25 NOTE — Progress Notes (Signed)
STROKE TEAM PROGRESS NOTE   HISTORY OF PRESENT ILLNESS (per record) Amy [name] is an 82 y.o. female with a history of hypertension, hypothyroidism, diabetes mellitus and diverticulosis, presenting with new onset loss of vision on the left side as well as confusion, and to some extent agitation. She has no previous history of stroke nor TIA clinically, and has been taking aspirin 81 mg per day. CT scan of the head showed no acute intracranial abnormality. MRI was attempted, but patient was agitated and study could not be obtained. She was noted in the ED to be confused and to have complete visual loss on the left side. No focal weakness was noted. NIH stroke score the time of this evaluation was 10. She was LKW 04/21/2016. Patient was not administered IV t-PA secondary to beyond time under for treatment consideration. She was admitted for further evaluation and treatment.   SUBJECTIVE (INTERVAL HISTORY) Her  Daughter is at the bedside.  Overall she feels her condition is improving.  She is more alert and interactive today and attended to the left side.EEG shows focal right temporal slowing but no definite epileptiform activity OBJECTIVE Temp:  [98 F (36.7 C)-99.1 F (37.3 C)] 98 F (36.7 C) (12/01 0500) Pulse Rate:  [58-64] 59 (12/01 0500) Cardiac Rhythm: Normal sinus rhythm (11/30 2047) Resp:  [20] 20 (12/01 0500) BP: (150-172)/(67-83) 150/76 (12/01 0500) SpO2:  [93 %-96 %] 93 % (12/01 0500)  CBC:   Recent Labs Lab 04/22/16 1928 04/22/16 2100  WBC  --  9.9  NEUTROABS  --  7.0  HGB 12.2 12.2  HCT 36.0 35.3*  MCV  --  90.1  PLT  --  [id number]    Basic Metabolic Panel:   Recent Labs Lab 04/22/16 1928 04/22/16 1932  NA 139 137  K 3.5 3.5  CL 98* 101  CO2  --  27  GLUCOSE 131* 130*  BUN 14 12  CREATININE 0.80 0.91  CALCIUM  --  9.4    Lipid Panel:     Component Value Date/Time   CHOL 206 (H) 04/23/2016 0435   TRIG 135 04/23/2016 0435   TRIG 249 (HH) 03/11/2006 1510   HDL 65 04/23/2016 0435   CHOLHDL 3.2 04/23/2016 0435   VLDL 27 04/23/2016 0435   LDLCALC 114 (H) 04/23/2016 0435   HgbA1c:  Lab Results  Component Value Date   HGBA1C 7.1 (H) 04/23/2016   Urine Drug Screen: No results found for: LABOPIA, COCAINSCRNUR, LABBENZ, AMPHETMU, THCU, LABBARB    IMAGING  Ct Head Wo Contrast 04/22/2016 No definite CT evidence for acute intracranial abnormality. Moderate periventricular, subcortical and deep white matter small vessel disease.   Ct Cervical Spine Wo Contrast 04/22/2016 No acute fracture or malalignment of the cervical spine. Moderate degenerative disc changes most notable at C5-C6 and C6-C7.   Dg Chest 1 View 04/22/2016 Mild cardiomegaly.  Similar LEFT lung scarring.   EEG :abnormal EEG due to focal right frontotemporal slowing, suggesting regional cortical or cerebral dysfunction in that area. No epileptiform activity or seizures are recorded.   PHYSICAL EXAM Pleasant elderly Caucasian lady not in distress. . Afebrile. Head is nontraumatic. Neck is supple without bruit.    Cardiac exam no murmur or gallop. Lungs are clear to auscultation. Distal pulses are well felt. Neurological Exam :  Awake and interactive today. No right gaze preference. Able to look all the way to the left past midline. She will blink to threat on both sides but slightly less on the  left. She was ignoring her son who was standing on her left but identified rest of the family members in the room. She is not cooperative for detailed visual field testing.  Darletta Moll were not visualized. Mild dysarthria but can be understood. Follows simple midline and one-step commands. No Hammond neglect today. No facial weakness.. Tongue midline. Purposeful antigravity strength on the right without weakness. Very minimum weakness of left grip and intrinsic hand muscles. Orbits right over left approximately. No drift on the left. Good strength in the legs bilaterally. Plantars downgoing. Deep  tendon reflexes symmetric. Gait not tested. ASSESSMENT/PLAN [name] is a 82 y.o. female with history of HTN, DM, hypothyroidism, and diverticulosis presenting with L sided vision loss and confusion. She did not receive IV t-PA due to beyond time treatment window.    Stroke:  Suspected small right brain infarct not visualized on MRI versus unwitnessed seizure with postictal state   Resultant  Neglect, L sided weakness  MRI  no acute infarct. Micro-hemorrhages in the cortex suggestive of possible amyloid angiopathy.   MRA  not done  Carotid Doppler  Bilateral 1-39% ICA stenosis, antegrade vertebral flow.  2D Echo  pending   LDL 114  HgbA1c 7.1  Lovenox 40 mg sq daily for VTE prophylaxis Diet Carb Modified Fluid consistency: Thin; Room service appropriate? Yes  aspirin 81 mg daily prior to admission, now on aspirin 325 mg daily  Patient counseled to be compliant with her antithrombotic medications  Ongoing aggressive stroke risk factor management  Therapy recommendations:  pending   Disposition:  pending  (lives alone)  DNR after discussion with family. She has a living will.  Hypertensive Urgency  BP as high as 185/108  Stable this am in the 160s  Permissive hypertension (OK if < 220/120) but gradually normalize in 5-7 days  Long-term BP goal normotensive  Hyperlipidemia  Home meds:  lipitor 20, resumed in hospital  LDL 114, goal < 70  Continue statin at discharge  Diabetes type II  HgbA1c7.1, goal < 7.0  Other Stroke Risk Factors  Advanced age  Family hx stroke (father, sister)  Hospital day # 3   I have personally examined this patient, reviewed notes, independently viewed imaging studies, participated in medical decision making and plan of care.ROS completed by me personally and pertinent positives fully documented  I have made any additions or clarifications directly to the above note. Agree with note above.  Patient has presented with  left-sided weakness and right-sided neglect which was worrisome for a right brain stroke but MRI scan does not show this. CT angiogram showed no significant large vessel stenosis or occlusion. She perhaps had a unwitnessed seizure and this may be prolonged postictal state. EEG -show focal right frontotemporal slowing which may be a postictal finding.. Continue Keppra XR 500 mg daily for seizure prophylaxis. Long discussion with daughter at the bedside and answered questions. Greater than 50% time during this 25 minute visit was spent on counseling and coordination of care: the patient's stroke, evaluation and treatment discussed with Dr. Sloan Leiter. Stroke team will sign off. Follow-up as an outpatient in the neurology clinic in 6 weeks. And will also address her dementia and cognitive impairment at that visit. Antony Contras, MD Medical Director [facility] Pager: [phone] 04/25/2016 2:48 PM  To contact Stroke Continuity provider, please refer to [email or web address] After hours, contact General Neurology

## 2016-04-25 NOTE — Care Management Note (Signed)
Case Management Note  Patient Details  Name: Amy [name] MRN: [id number] Date of Birth: 06/10/1929  Subjective/Objective:                    Action/Plan: Pt discharging to CIR today. No further needs per CM.   Expected Discharge Date:                  Expected Discharge Plan:  [place]  In-House Referral:     Discharge planning Services     Post Acute Care Choice:    Choice offered to:     DME Arranged:    DME Agency:     HH Arranged:    Micro Agency:     Status of Service:  Completed, signed off  If discussed at [employer] of Stay Meetings, dates discussed:    Additional Comments:  Pollie Friar, RN 04/25/2016, 2:50 PM

## 2016-04-25 NOTE — Progress Notes (Signed)
  Echocardiogram 2D Echocardiogram has been performed.  Amy [name] 04/25/2016, 4:49 PM

## 2016-04-25 NOTE — Discharge Summary (Signed)
PATIENT DETAILS Name: Amy [name] Age: 82 y.o. Sex: female Date of Birth: 1929/12/18 MRN: [id number]. Admitting Physician: Etta Quill, DO PCP:FRY,STEPHEN A, MD  Admit Date: 04/22/2016 Discharge date: 04/25/2016  Recommendations for Outpatient Follow-up:  1. Follow up with PCP in 1-2 weeks 2. Please obtain BMP/CBC in one week 3. Ensure follow-up with Dr. Sethi/neurology 4. 2 D Echo done-results still pending-please follow  Admitted From:  Home  Disposition: [place]: No  Equipment/Devices: None  Discharge Condition: Stable  CODE STATUS: FULL CODE  Diet recommendation:  Heart Healthy / Carb Modified  Brief Summary: See H&P, Labs, Consult and Test reports for all details in brief, Patient is a 82 y.o. female with past medical history of type 2 diabetes, hypertension, dyslipidemia, presented to the hospital with left-sided vision loss and confusion. Thought to have acute CVA, see below for further details.  Brief Hospital Course: Probable Acute right brain infarct versus Seizure: Patient presented with left-sided weakness, left-sided hemianopsia and left hemi-neglect, all of these deficits have significantly improved. MRI of the brain (limited) did not show stroke, so not sure if patient's symptomatology on admission was related to a small acute CVA not seen on MRI brain or patient had a unwitnessed seizure with Todd's paresis and had a prolonged post ictal state. Patient underwent workup, EEG was negative for seizures, CT angiogram of the head and neck did not show any significant stenosis. 2-D echocardiogram has been done, results are still pending.  Hemoglobin A1c at 7.1, LDL at 114. Recommendations are to continue with Keppra, aspirin 325 mg on discharge. Lipitor dosage has been increased to 40 mg. Patient is being discharged to [facility] in a stable manner, please ensure patient follows up with neurology.    Hypertension: Initiallyy permissive hypertension  was allowed, on day of discharge, we will resume amlodipine. Follow and adjust accordingly.  Type 2 diabetes: CBGs stable with SSI, resume oral hypoglycemic agents on discharge. Hemoglobin A1c 7.1. Defer further optimization to the outpatient setting.  Dyslipidemia: LDL 114-not at goal-on 20 mg of Lipitor as outpatient- increase to 40 mg. Repeat LDL in 3 months.  Hypothyroidism: Continue levothyroxine  Procedures/Studies: None  Discharge Diagnoses:  Principal Problem:   Left homonymous hemianopsia Active Problems:   Diabetes mellitus without complication (HCC)   Essential hypertension   Cerebral thrombosis with cerebral infarction   Discharge Instructions:  Activity:  As tolerated with Full fall precautions use walker/cane & assistance as needed   Discharge Instructions    Ambulatory referral to Neurology    Complete by:  As directed    Diet - low sodium heart healthy    Complete by:  As directed    Diet Carb Modified    Complete by:  As directed    Increase activity slowly    Complete by:  As directed        Medication List    STOP taking these medications   cyclobenzaprine 10 MG tablet Commonly known as:  FLEXERIL   ibuprofen 200 MG tablet Commonly known as:  ADVIL,MOTRIN     TAKE these medications   amLODipine 5 MG tablet Commonly known as:  NORVASC TAKE ONE TABLET BY MOUTH ONCE DAILY   aspirin 325 MG tablet Take 1 tablet (325 mg total) by mouth daily. Start taking on:  04/26/2016 What changed:  medication strength  how much to take   atorvastatin 40 MG tablet Commonly known as:  LIPITOR Take 1 tablet (40 mg total)  by mouth daily. What changed:  See the new instructions.   glipiZIDE 10 MG tablet Commonly known as:  GLUCOTROL Take 10 mg by mouth 2 (two) times daily before a meal.   insulin aspart 100 UNIT/ML injection Commonly known as:  novoLOG 0-15 Units, Subcutaneous, 3 times daily with meals CBG < 70: implement hypoglycemia protocol CBG  70 - 120: 0 units CBG 121 - 150: 2 units CBG 151 - 200: 3 units CBG 201 - 250: 5 units CBG 251 - 300: 8 units CBG 301 - 350: 11 units CBG 351 - 400: 15 units CBG > 400: call MD   levETIRAcetam 500 MG 24 hr tablet Commonly known as:  KEPPRA XR Take 1 tablet (500 mg total) by mouth daily. Start taking on:  04/26/2016   levothyroxine 175 MCG tablet Commonly known as:  SYNTHROID Take 1 tablet (175 mcg total) by mouth daily before breakfast.   metFORMIN 1000 MG tablet Commonly known as:  GLUCOPHAGE Take 1 tablet (1,000 mg total) by mouth 2 (two) times daily with a meal.   omeprazole 40 MG capsule Commonly known as:  PRILOSEC Take 1 capsule (40 mg total) by mouth daily. What changed:  when to take this  reasons to take this   sertraline 100 MG tablet Commonly known as:  ZOLOFT Take 1 tablet (100 mg total) by mouth daily.      Follow-up Information    FRY,STEPHEN A, MD. Schedule an appointment as soon as possible for a visit in 2 week(s).   Specialty:  Family Medicine Contact information: [address] [place] [address] [phone]        Antony Contras, MD. Schedule an appointment as soon as possible for a visit in 2 month(s).   Specialties:  Neurology, Radiology Contact information: [address] [place]  [address] [phone]          Allergies  Allergen Reactions  . Piroxicam Other (See Comments)    REACTION: "SEVERE ALLERGY REACTION"  . Ativan [Lorazepam] Other (See Comments)    Caused delirium, confusion, disorientation, slightly increased agitation.  . Diclofenac Sodium Nausea And Vomiting  . Latex Rash    ?  Marland Kitchen Tape Rash    ?    Consultations:   neurology  Other Procedures/Studies: Ct Angio Head W Or Wo Contrast  Result Date: 04/23/2016 CLINICAL DATA:  Stroke, follow-up evaluation. Found on floor. Recent gait imbalance. Clinical concern for acute RIGHT PCA territory infarct. EXAM: CT ANGIOGRAPHY HEAD AND NECK  TECHNIQUE: Multidetector CT imaging of the head and neck was performed using the standard protocol during bolus administration of intravenous contrast. Multiplanar CT image reconstructions and MIPs were obtained to evaluate the vascular anatomy. Carotid stenosis measurements (when applicable) are obtained utilizing NASCET criteria, using the distal internal carotid diameter as the denominator. CONTRAST:  50 cc Isovue 370 COMPARISON:  CT HEAD April 22, 2016 FINDINGS: CTA NECK AORTIC ARCH: Normal appearance of the thoracic arch, 2 vessel arch. Moderate intimal thickening calcific atherosclerosis of the aortic arch and LEFT subclavian artery origin. The origins of the innominate, left Common carotid artery and subclavian artery are widely patent. RIGHT CAROTID SYSTEM: Common carotid artery is widely patent, coursing in a straight line fashion. Normal appearance of the carotid bifurcation without hemodynamically significant stenosis by NASCET criteria. Mild eccentric calcific atherosclerosis. Normal appearance of the included internal carotid artery. LEFT CAROTID SYSTEM: Common carotid artery is widely patent, coursing in a straight line fashion. Mild eccentric intimal thickening. Normal appearance  of the carotid bifurcation without hemodynamically significant stenosis by NASCET criteria. Mild to moderate eccentric calcific atherosclerosis. Normal appearance of the included internal carotid artery. VERTEBRAL ARTERIES:Left vertebral artery is dominant. Normal appearance of the vertebral arteries, which appear widely patent. SKELETON: No acute osseous process though bone windows have not been submitted. Moderate C5-6 and C6-7 degenerative discs. OTHER NECK: Soft tissues of the neck are non-acute though, not tailored for evaluation. RIGHT thyroidectomy. Enlarged heterogeneous LEFT thyroid without dominant nodule. CTA HEAD ANTERIOR CIRCULATION: Normal appearance of the cervical internal carotid arteries, petrous,  cavernous and supra clinoid internal carotid arteries. 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 Widely patent anterior communicating artery. Patent anterior and middle cerebral arteries with mild luminal regularity. No large vessel occlusion, hemodynamically significant stenosis, dissection, luminal irregularity, contrast extravasation or aneurysm. POSTERIOR CIRCULATION: Normal appearance of the vertebral arteries, vertebrobasilar junction and basilar artery, as well as main branch vessels. Patent posterior cerebral arteries. Moderate tandem stenoses bilateral posterior cerebral arteries. No large vessel occlusion, dissection, luminal irregularity, contrast extravasation or aneurysm. VENOUS SINUSES: Major dural venous sinuses are patent though not tailored for evaluation on this angiographic examination. ANATOMIC VARIANTS: None. DELAYED PHASE: No abnormal intracranial enhancement. Review of the MIP images confirms the above findings IMPRESSION: CTA NECK: No hemodynamically significant stenosis or acute vascular process. CTA HEAD:  No emergent large vessel occlusion. 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 Moderate luminal irregularity of the bilateral posterior cerebral arteries compatible with atherosclerosis. Electronically Signed   By: Elon Alas M.D.   On: 04/23/2016 19:39   Dg Chest 1 View  Result Date: 04/22/2016 CLINICAL DATA:  Confusion, severe headache today. Fell yesterday. Found underneath bed today. EXAM: CHEST 1 VIEW COMPARISON:  Chest radiograph June 29, 2006 FINDINGS: Cardiac silhouette is mildly enlarged. Mildly tortuous and atherosclerotic aorta. Strandy densities LEFT lung. No pleural effusion or focal consolidation. No pneumothorax. Osteopenia. Soft tissue planes are unremarkable. IMPRESSION: Mild cardiomegaly.  Similar LEFT  lung scarring. Electronically Signed   By: Elon Alas M.D.   On: 04/22/2016 20:35   Ct Head Wo Contrast  Result Date: 04/22/2016 CLINICAL DATA:  Found on floor, previous fall with head injury EXAM: CT HEAD WITHOUT CONTRAST CT CERVICAL SPINE WITHOUT CONTRAST TECHNIQUE: Multidetector CT imaging of the head and cervical spine was performed following the standard protocol without intravenous contrast. Multiplanar CT image reconstructions of the cervical spine were also generated. COMPARISON:  MRI 07/24/2015 FINDINGS: CT HEAD FINDINGS Brain: No acute territorial infarction, intracranial hemorrhage or extra-axial fluid collection is seen. Mild atrophy. Moderate periventricular, subcortical and deep white matter hypodensities consistent with small vessel disease. Decreased density in the pons, felt to correlate with abnormal small vessel changes on prior MRI. Heterogenous density in the bilateral thalamus, also likely relate to mild signal abnormality noted on T2 images. Vascular: No hyperdense vessels. There are carotid artery calcifications. There are vertebral artery calcifications. Skull: Mastoid air cells are clear.  There is no skull fracture Sinuses/Orbits: Mild mucosal thickening in the ethmoid sinuses. No acute orbital abnormality. Other: None CT CERVICAL SPINE FINDINGS Alignment: No subluxation.  Facet alignment is maintained. Skull base and vertebrae: Craniovertebral junction appears intact. Vertebral body heights are maintained. There is no fracture identified. Soft tissues and spinal canal: No prevertebral fluid or swelling. No visible canal hematoma. Disc levels: Moderate degenerative disc changes at C5-C6 and C6-C7 with endplate changes and osteophytes. Multilevel facet arthropathy.  No significant foraminal stenosis. Upper chest: Lung apices clear. Enlarged left lobe of thyroid gland. Other: Carotid artery calcifications. IMPRESSION: 1. No definite CT evidence for acute intracranial abnormality.  Moderate periventricular, subcortical and deep white matter small vessel disease. 2. No acute fracture or malalignment of the cervical spine. Moderate degenerative disc changes most notable at C5-C6 and C6-C7. Electronically Signed   By: Donavan Foil M.D.   On: 04/22/2016 20:29   Ct Angio Neck W Or Wo Contrast  Result Date: 04/23/2016 CLINICAL DATA:  Stroke, follow-up evaluation. Found on floor. Recent gait imbalance. Clinical concern for acute RIGHT PCA territory infarct. EXAM: CT ANGIOGRAPHY HEAD AND NECK TECHNIQUE: Multidetector CT imaging of the head and neck was performed using the standard protocol during bolus administration of intravenous contrast. Multiplanar CT image reconstructions and MIPs were obtained to evaluate the vascular anatomy. Carotid stenosis measurements (when applicable) are obtained utilizing NASCET criteria, using the distal internal carotid diameter as the denominator. CONTRAST:  50 cc Isovue 370 COMPARISON:  CT HEAD April 22, 2016 FINDINGS: CTA NECK AORTIC ARCH: Normal appearance of the thoracic arch, 2 vessel arch. Moderate intimal thickening calcific atherosclerosis of the aortic arch and LEFT subclavian artery origin. The origins of the innominate, left Common carotid artery and subclavian artery are widely patent. RIGHT CAROTID SYSTEM: Common carotid artery is widely patent, coursing in a straight line fashion. Normal appearance of the carotid bifurcation without hemodynamically significant stenosis by NASCET criteria. Mild eccentric calcific atherosclerosis. Normal appearance of the included internal carotid artery. LEFT CAROTID SYSTEM: Common carotid artery is widely patent, coursing in a straight line fashion. Mild eccentric intimal thickening. Normal appearance of the carotid bifurcation without hemodynamically significant stenosis by NASCET criteria. Mild to moderate eccentric calcific atherosclerosis. Normal appearance of the included internal carotid artery. VERTEBRAL  ARTERIES:Left vertebral artery is dominant. Normal appearance of the vertebral arteries, which appear widely patent. SKELETON: No acute osseous process though bone windows have not been submitted. Moderate C5-6 and C6-7 degenerative discs. OTHER NECK: Soft tissues of the neck are non-acute though, not tailored for evaluation. RIGHT thyroidectomy. Enlarged heterogeneous LEFT thyroid without dominant nodule. CTA HEAD ANTERIOR CIRCULATION: Normal appearance of the cervical internal carotid arteries, petrous, cavernous and supra clinoid internal carotid arteries. 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 Widely patent anterior communicating artery. Patent anterior and middle cerebral arteries with mild luminal regularity. No large vessel occlusion, hemodynamically significant stenosis, dissection, luminal irregularity, contrast extravasation or aneurysm. POSTERIOR CIRCULATION: Normal appearance of the vertebral arteries, vertebrobasilar junction and basilar artery, as well as main branch vessels. Patent posterior cerebral arteries. Moderate tandem stenoses bilateral posterior cerebral arteries. No large vessel occlusion, dissection, luminal irregularity, contrast extravasation or aneurysm. VENOUS SINUSES: Major dural venous sinuses are patent though not tailored for evaluation on this angiographic examination. ANATOMIC VARIANTS: None. DELAYED PHASE: No abnormal intracranial enhancement. Review of the MIP images confirms the above findings IMPRESSION: CTA NECK: No hemodynamically significant stenosis or acute vascular process. CTA HEAD:  No emergent large vessel occlusion. 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 Moderate luminal irregularity of the bilateral posterior cerebral arteries compatible with atherosclerosis. Electronically Signed   By: Elon Alas  M.D.   On: 04/23/2016 19:39   Ct Cervical Spine Wo Contrast  Result Date: 04/22/2016 CLINICAL DATA:  Found on floor, previous fall with head injury EXAM: CT HEAD WITHOUT CONTRAST CT CERVICAL SPINE WITHOUT CONTRAST TECHNIQUE: Multidetector  CT imaging of the head and cervical spine was performed following the standard protocol without intravenous contrast. Multiplanar CT image reconstructions of the cervical spine were also generated. COMPARISON:  MRI 07/24/2015 FINDINGS: CT HEAD FINDINGS Brain: No acute territorial infarction, intracranial hemorrhage or extra-axial fluid collection is seen. Mild atrophy. Moderate periventricular, subcortical and deep white matter hypodensities consistent with small vessel disease. Decreased density in the pons, felt to correlate with abnormal small vessel changes on prior MRI. Heterogenous density in the bilateral thalamus, also likely relate to mild signal abnormality noted on T2 images. Vascular: No hyperdense vessels. There are carotid artery calcifications. There are vertebral artery calcifications. Skull: Mastoid air cells are clear.  There is no skull fracture Sinuses/Orbits: Mild mucosal thickening in the ethmoid sinuses. No acute orbital abnormality. Other: None CT CERVICAL SPINE FINDINGS Alignment: No subluxation.  Facet alignment is maintained. Skull base and vertebrae: Craniovertebral junction appears intact. Vertebral body heights are maintained. There is no fracture identified. Soft tissues and spinal canal: No prevertebral fluid or swelling. No visible canal hematoma. Disc levels: Moderate degenerative disc changes at C5-C6 and C6-C7 with endplate changes and osteophytes. Multilevel facet arthropathy. No significant foraminal stenosis. Upper chest: Lung apices clear. Enlarged left lobe of thyroid gland. Other: Carotid artery calcifications. IMPRESSION: 1. No definite CT evidence for acute intracranial abnormality. Moderate periventricular, subcortical and deep  white matter small vessel disease. 2. No acute fracture or malalignment of the cervical spine. Moderate degenerative disc changes most notable at C5-C6 and C6-C7. Electronically Signed   By: Donavan Foil M.D.   On: 04/22/2016 20:29   Mr Brain Wo Contrast  Result Date: 04/24/2016 CLINICAL DATA:  Stroke.  Found on floor. EXAM: MRI HEAD WITHOUT CONTRAST TECHNIQUE: Multiplanar, multiecho pulse sequences of the brain and surrounding structures were obtained without intravenous contrast. COMPARISON:  07/24/2015 FINDINGS: Brain: No acute infarction, hemorrhage, hydrocephalus, extra-axial collection or mass lesion. Confluent gliosis in the cerebral white matter and patchy in the pons, nonspecific but consistent with chronic microvascular disease given patient's multiple vascular risk factors. Remote small vessel infarct in the peripheral left cerebellum. Chronic hemorrhagic foci were better seen on previous 3 T exam using SMI technique. Hypertensive pattern hemorrhages are seen in the bilateral deep gray nuclei and pons. There are also scattered cortical chronic hemorrhagic foci, with predilection for the posterior right cerebral hemisphere. The lobar hemorrhages imply superimposed amyloid angiopathy or previous trauma. Vascular: Preserved flow voids Skull and upper cervical spine: Negative Sinuses/Orbits: Bilateral cataract resection. IMPRESSION: 1. No acute finding or change from February 2017. 2. Advanced chronic microvascular disease and remote hypertensive micro hemorrhages. 3. Multiple remote cortical hemorrhages, possible superimposed amyloid angiopathy. Electronically Signed   By: Monte Fantasia M.D.   On: 04/24/2016 12:50     TODAY-DAY OF DISCHARGE:  Subjective:   Edwin Loiselle today has no headache,no chest abdominal pain,no new weakness tingling or numbness, feels much better wants to go home today.   Objective:   Blood pressure (!) 147/73, pulse 72, temperature 98.3 F (36.8 C), temperature  source Oral, resp. rate 18, height 5\' 6"  (1.676 m), weight 74.8 kg (165 lb), SpO2 94 %. No intake or output data in the 24 hours ending 04/25/16 1715 Filed Weights   04/22/16 1811  Weight: 74.8 kg (165 lb)    Exam: Awake Alert, Oriented *3, No new F.N deficits, Normal affect [place].AT,PERRAL Supple Neck,No JVD, No cervical lymphadenopathy appriciated.  Symmetrical Chest wall movement, Good air movement bilaterally, CTAB RRR,No Gallops,Rubs or new  Murmurs, No Parasternal Heave +ve B.Sounds, Abd Soft, Non tender, No organomegaly appriciated, No rebound -guarding or rigidity. No Cyanosis, Clubbing or edema, No new Rash or bruise   PERTINENT RADIOLOGIC STUDIES: Ct Angio Head W Or Wo Contrast  Result Date: 04/23/2016 CLINICAL DATA:  Stroke, follow-up evaluation. Found on floor. Recent gait imbalance. Clinical concern for acute RIGHT PCA territory infarct. EXAM: CT ANGIOGRAPHY HEAD AND NECK TECHNIQUE: Multidetector CT imaging of the head and neck was performed using the standard protocol during bolus administration of intravenous contrast. Multiplanar CT image reconstructions and MIPs were obtained to evaluate the vascular anatomy. Carotid stenosis measurements (when applicable) are obtained utilizing NASCET criteria, using the distal internal carotid diameter as the denominator. CONTRAST:  50 cc Isovue 370 COMPARISON:  CT HEAD April 22, 2016 FINDINGS: CTA NECK AORTIC ARCH: Normal appearance of the thoracic arch, 2 vessel arch. Moderate intimal thickening calcific atherosclerosis of the aortic arch and LEFT subclavian artery origin. The origins of the innominate, left Common carotid artery and subclavian artery are widely patent. RIGHT CAROTID SYSTEM: Common carotid artery is widely patent, coursing in a straight line fashion. Normal appearance of the carotid bifurcation without hemodynamically significant stenosis by NASCET criteria. Mild eccentric calcific atherosclerosis. Normal appearance of the  included internal carotid artery. LEFT CAROTID SYSTEM: Common carotid artery is widely patent, coursing in a straight line fashion. Mild eccentric intimal thickening. Normal appearance of the carotid bifurcation without hemodynamically significant stenosis by NASCET criteria. Mild to moderate eccentric calcific atherosclerosis. Normal appearance of the included internal carotid artery. VERTEBRAL ARTERIES:Left vertebral artery is dominant. Normal appearance of the vertebral arteries, which appear widely patent. SKELETON: No acute osseous process though bone windows have not been submitted. Moderate C5-6 and C6-7 degenerative discs. OTHER NECK: Soft tissues of the neck are non-acute though, not tailored for evaluation. RIGHT thyroidectomy. Enlarged heterogeneous LEFT thyroid without dominant nodule. CTA HEAD ANTERIOR CIRCULATION: Normal appearance of the cervical internal carotid arteries, petrous, cavernous and supra clinoid internal carotid arteries. 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 Widely patent anterior communicating artery. Patent anterior and middle cerebral arteries with mild luminal regularity. No large vessel occlusion, hemodynamically significant stenosis, dissection, luminal irregularity, contrast extravasation or aneurysm. POSTERIOR CIRCULATION: Normal appearance of the vertebral arteries, vertebrobasilar junction and basilar artery, as well as main branch vessels. Patent posterior cerebral arteries. Moderate tandem stenoses bilateral posterior cerebral arteries. No large vessel occlusion, dissection, luminal irregularity, contrast extravasation or aneurysm. VENOUS SINUSES: Major dural venous sinuses are patent though not tailored for evaluation on this angiographic examination. ANATOMIC VARIANTS: None. DELAYED PHASE: No abnormal intracranial enhancement. Review of the MIP images confirms the above findings  IMPRESSION: CTA NECK: No hemodynamically significant stenosis or acute vascular process. CTA HEAD:  No emergent large vessel occlusion. 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 Moderate luminal irregularity of the bilateral posterior cerebral arteries compatible with atherosclerosis. Electronically Signed   By: Elon Alas M.D.   On: 04/23/2016 19:39   Dg Chest 1 View  Result Date: 04/22/2016 CLINICAL DATA:  Confusion, severe headache today. Fell yesterday. Found underneath bed today. EXAM: CHEST 1 VIEW COMPARISON:  Chest radiograph June 29, 2006 FINDINGS: Cardiac silhouette is mildly enlarged. Mildly tortuous and atherosclerotic aorta. Strandy densities LEFT lung. No pleural effusion or focal consolidation. No pneumothorax. Osteopenia. Soft tissue planes are unremarkable. IMPRESSION: Mild cardiomegaly.  Similar LEFT lung scarring. Electronically Signed   By: Sandie Ano  Bloomer M.D.   On: 04/22/2016 20:35   Ct Head Wo Contrast  Result Date: 04/22/2016 CLINICAL DATA:  Found on floor, previous fall with head injury EXAM: CT HEAD WITHOUT CONTRAST CT CERVICAL SPINE WITHOUT CONTRAST TECHNIQUE: Multidetector CT imaging of the head and cervical spine was performed following the standard protocol without intravenous contrast. Multiplanar CT image reconstructions of the cervical spine were also generated. COMPARISON:  MRI 07/24/2015 FINDINGS: CT HEAD FINDINGS Brain: No acute territorial infarction, intracranial hemorrhage or extra-axial fluid collection is seen. Mild atrophy. Moderate periventricular, subcortical and deep white matter hypodensities consistent with small vessel disease. Decreased density in the pons, felt to correlate with abnormal small vessel changes on prior MRI. Heterogenous density in the bilateral thalamus, also likely relate to mild signal abnormality noted on T2 images. Vascular: No hyperdense vessels. There are  carotid artery calcifications. There are vertebral artery calcifications. Skull: Mastoid air cells are clear.  There is no skull fracture Sinuses/Orbits: Mild mucosal thickening in the ethmoid sinuses. No acute orbital abnormality. Other: None CT CERVICAL SPINE FINDINGS Alignment: No subluxation.  Facet alignment is maintained. Skull base and vertebrae: Craniovertebral junction appears intact. Vertebral body heights are maintained. There is no fracture identified. Soft tissues and spinal canal: No prevertebral fluid or swelling. No visible canal hematoma. Disc levels: Moderate degenerative disc changes at C5-C6 and C6-C7 with endplate changes and osteophytes. Multilevel facet arthropathy. No significant foraminal stenosis. Upper chest: Lung apices clear. Enlarged left lobe of thyroid gland. Other: Carotid artery calcifications. IMPRESSION: 1. No definite CT evidence for acute intracranial abnormality. Moderate periventricular, subcortical and deep white matter small vessel disease. 2. No acute fracture or malalignment of the cervical spine. Moderate degenerative disc changes most notable at C5-C6 and C6-C7. Electronically Signed   By: Donavan Foil M.D.   On: 04/22/2016 20:29   Ct Angio Neck W Or Wo Contrast  Result Date: 04/23/2016 CLINICAL DATA:  Stroke, follow-up evaluation. Found on floor. Recent gait imbalance. Clinical concern for acute RIGHT PCA territory infarct. EXAM: CT ANGIOGRAPHY HEAD AND NECK TECHNIQUE: Multidetector CT imaging of the head and neck was performed using the standard protocol during bolus administration of intravenous contrast. Multiplanar CT image reconstructions and MIPs were obtained to evaluate the vascular anatomy. Carotid stenosis measurements (when applicable) are obtained utilizing NASCET criteria, using the distal internal carotid diameter as the denominator. CONTRAST:  50 cc Isovue 370 COMPARISON:  CT HEAD April 22, 2016 FINDINGS: CTA NECK AORTIC ARCH: Normal appearance  of the thoracic arch, 2 vessel arch. Moderate intimal thickening calcific atherosclerosis of the aortic arch and LEFT subclavian artery origin. The origins of the innominate, left Common carotid artery and subclavian artery are widely patent. RIGHT CAROTID SYSTEM: Common carotid artery is widely patent, coursing in a straight line fashion. Normal appearance of the carotid bifurcation without hemodynamically significant stenosis by NASCET criteria. Mild eccentric calcific atherosclerosis. Normal appearance of the included internal carotid artery. LEFT CAROTID SYSTEM: Common carotid artery is widely patent, coursing in a straight line fashion. Mild eccentric intimal thickening. Normal appearance of the carotid bifurcation without hemodynamically significant stenosis by NASCET criteria. Mild to moderate eccentric calcific atherosclerosis. Normal appearance of the included internal carotid artery. VERTEBRAL ARTERIES:Left vertebral artery is dominant. Normal appearance of the vertebral arteries, which appear widely patent. SKELETON: No acute osseous process though bone windows have not been submitted. Moderate C5-6 and C6-7 degenerative discs. OTHER NECK: Soft tissues of the neck are non-acute though, not tailored for evaluation. RIGHT thyroidectomy. Enlarged heterogeneous  LEFT thyroid without dominant nodule. CTA HEAD ANTERIOR CIRCULATION: Normal appearance of the cervical internal carotid arteries, petrous, cavernous and supra clinoid internal carotid arteries. 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 Widely patent anterior communicating artery. Patent anterior and middle cerebral arteries with mild luminal regularity. No large vessel occlusion, hemodynamically significant stenosis, dissection, luminal irregularity, contrast extravasation or aneurysm. POSTERIOR CIRCULATION: Normal appearance of the vertebral arteries,  vertebrobasilar junction and basilar artery, as well as main branch vessels. Patent posterior cerebral arteries. Moderate tandem stenoses bilateral posterior cerebral arteries. No large vessel occlusion, dissection, luminal irregularity, contrast extravasation or aneurysm. VENOUS SINUSES: Major dural venous sinuses are patent though not tailored for evaluation on this angiographic examination. ANATOMIC VARIANTS: None. DELAYED PHASE: No abnormal intracranial enhancement. Review of the MIP images confirms the above findings IMPRESSION: CTA NECK: No hemodynamically significant stenosis or acute vascular process. CTA HEAD:  No emergent large vessel occlusion. 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 Moderate luminal irregularity of the bilateral posterior cerebral arteries compatible with atherosclerosis. Electronically Signed   By: Elon Alas M.D.   On: 04/23/2016 19:39   Ct Cervical Spine Wo Contrast  Result Date: 04/22/2016 CLINICAL DATA:  Found on floor, previous fall with head injury EXAM: CT HEAD WITHOUT CONTRAST CT CERVICAL SPINE WITHOUT CONTRAST TECHNIQUE: Multidetector CT imaging of the head and cervical spine was performed following the standard protocol without intravenous contrast. Multiplanar CT image reconstructions of the cervical spine were also generated. COMPARISON:  MRI 07/24/2015 FINDINGS: CT HEAD FINDINGS Brain: No acute territorial infarction, intracranial hemorrhage or extra-axial fluid collection is seen. Mild atrophy. Moderate periventricular, subcortical and deep white matter hypodensities consistent with small vessel disease. Decreased density in the pons, felt to correlate with abnormal small vessel changes on prior MRI. Heterogenous density in the bilateral thalamus, also likely relate to mild signal abnormality noted on T2 images. Vascular: No hyperdense vessels. There are carotid artery calcifications. There are  vertebral artery calcifications. Skull: Mastoid air cells are clear.  There is no skull fracture Sinuses/Orbits: Mild mucosal thickening in the ethmoid sinuses. No acute orbital abnormality. Other: None CT CERVICAL SPINE FINDINGS Alignment: No subluxation.  Facet alignment is maintained. Skull base and vertebrae: Craniovertebral junction appears intact. Vertebral body heights are maintained. There is no fracture identified. Soft tissues and spinal canal: No prevertebral fluid or swelling. No visible canal hematoma. Disc levels: Moderate degenerative disc changes at C5-C6 and C6-C7 with endplate changes and osteophytes. Multilevel facet arthropathy. No significant foraminal stenosis. Upper chest: Lung apices clear. Enlarged left lobe of thyroid gland. Other: Carotid artery calcifications. IMPRESSION: 1. No definite CT evidence for acute intracranial abnormality. Moderate periventricular, subcortical and deep white matter small vessel disease. 2. No acute fracture or malalignment of the cervical spine. Moderate degenerative disc changes most notable at C5-C6 and C6-C7. Electronically Signed   By: Donavan Foil M.D.   On: 04/22/2016 20:29   Mr Brain Wo Contrast  Result Date: 04/24/2016 CLINICAL DATA:  Stroke.  Found on floor. EXAM: MRI HEAD WITHOUT CONTRAST TECHNIQUE: Multiplanar, multiecho pulse sequences of the brain and surrounding structures were obtained without intravenous contrast. COMPARISON:  07/24/2015 FINDINGS: Brain: No acute infarction, hemorrhage, hydrocephalus, extra-axial collection or mass lesion. Confluent gliosis in the cerebral white matter and patchy in the pons, nonspecific but consistent with chronic microvascular disease given patient's multiple vascular risk factors. Remote small vessel infarct in the peripheral left cerebellum.  Chronic hemorrhagic foci were better seen on previous 3 T exam using SMI technique. Hypertensive pattern hemorrhages are seen in the bilateral deep gray nuclei and  pons. There are also scattered cortical chronic hemorrhagic foci, with predilection for the posterior right cerebral hemisphere. The lobar hemorrhages imply superimposed amyloid angiopathy or previous trauma. Vascular: Preserved flow voids Skull and upper cervical spine: Negative Sinuses/Orbits: Bilateral cataract resection. IMPRESSION: 1. No acute finding or change from February 2017. 2. Advanced chronic microvascular disease and remote hypertensive micro hemorrhages. 3. Multiple remote cortical hemorrhages, possible superimposed amyloid angiopathy. Electronically Signed   By: Monte Fantasia M.D.   On: 04/24/2016 12:50     PERTINENT LAB RESULTS: CBC:  Recent Labs  04/22/16 1928 04/22/16 2100  WBC  --  9.9  HGB 12.2 12.2  HCT 36.0 35.3*  PLT  --  182   CMET CMP     Component Value Date/Time   NA 137 04/22/2016 1932   K 3.5 04/22/2016 1932   CL 101 04/22/2016 1932   CO2 27 04/22/2016 1932   GLUCOSE 130 (H) 04/22/2016 1932   GLUCOSE 91 04/22/2006 0808   BUN 12 04/22/2016 1932   CREATININE 0.91 04/22/2016 1932   CALCIUM 9.4 04/22/2016 1932   PROT 7.1 04/22/2016 1932   ALBUMIN 3.8 04/22/2016 1932   AST 22 04/22/2016 1932   ALT 17 04/22/2016 1932   ALKPHOS 73 04/22/2016 1932   BILITOT 1.8 (H) 04/22/2016 1932   GFRNONAA 56 (L) 04/22/2016 1932   GFRAA >60 04/22/2016 1932    GFR Estimated Creatinine Clearance: 47.6 mL/min (by C-G formula based on SCr of 0.91 mg/dL). No results for input(s): LIPASE, AMYLASE in the last 72 hours.  Recent Labs  04/22/16 1932  CKTOTAL 155   Invalid input(s): POCBNP No results for input(s): DDIMER in the last 72 hours.  Recent Labs  04/23/16 0435  HGBA1C 7.1*    Recent Labs  04/23/16 0435  CHOL 206*  HDL 65  LDLCALC 114*  TRIG 135  CHOLHDL 3.2   No results for input(s): TSH, T4TOTAL, T3FREE, THYROIDAB in the last 72 hours.  Invalid input(s): FREET3 No results for input(s): VITAMINB12, FOLATE, FERRITIN, TIBC, IRON, RETICCTPCT  in the last 72 hours. Coags: No results for input(s): INR in the last 72 hours.  Invalid input(s): PT Microbiology: Recent Results (from the past 240 hour(s))  Urine culture     Status: Abnormal   Collection Time: 04/22/16  7:11 PM  Result Value Ref Range Status   Specimen Description URINE, RANDOM  Final   Special Requests NONE  Final   Culture MULTIPLE SPECIES PRESENT, SUGGEST RECOLLECTION (A)  Final   Report Status 04/24/2016 FINAL  Final    FURTHER DISCHARGE INSTRUCTIONS:  Get Medicines reviewed and adjusted: Please take all your medications with you for your next visit with your Primary MD  Laboratory/radiological data: Please request your Primary MD to go over all hospital tests and procedure/radiological results at the follow up, please ask your Primary MD to get all Hospital records sent to his/her office.  In some cases, they will be blood work, cultures and biopsy results pending at the time of your discharge. Please request that your primary care M.D. goes through all the records of your hospital data and follows up on these results.  Also Note the following: If you experience worsening of your admission symptoms, develop shortness of breath, life threatening emergency, suicidal or homicidal thoughts you must seek medical attention immediately by calling 911  or calling your MD immediately  if symptoms less severe.  You must read complete instructions/literature along with all the possible adverse reactions/side effects for all the Medicines you take and that have been prescribed to you. Take any new Medicines after you have completely understood and accpet all the possible adverse reactions/side effects.   Do not drive when taking Pain medications or sleeping medications (Benzodaizepines)  Do not take more than prescribed Pain, Sleep and Anxiety Medications. It is not advisable to combine anxiety,sleep and pain medications without talking with your primary care  practitioner  Special Instructions: If you have smoked or chewed Tobacco  in the last 2 yrs please stop smoking, stop any regular Alcohol  and or any Recreational drug use.  Wear Seat belts while driving.  Please note: You were cared for by a hospitalist during your hospital stay. Once you are discharged, your primary care physician will handle any further medical issues. Please note that NO REFILLS for any discharge medications will be authorized once you are discharged, as it is imperative that you return to your primary care physician (or establish a relationship with a primary care physician if you do not have one) for your post hospital discharge needs so that they can reassess your need for medications and monitor your lab values.  Total Time spent coordinating discharge including counseling, education and face to face time equals 45 minutes.  SignedOren Binet 04/25/2016 5:15 PM

## 2016-04-25 NOTE — PMR Pre-admission (Signed)
PMR Admission Coordinator Pre-Admission Assessment  Patient: Amy [name] is an 82 y.o., female MRN: [id number] DOB: July 02, 1929 Height: 5\' 6"  (167.6 cm) Weight: 74.8 kg (165 lb)              Insurance Information HMO: X    PPO:      PCP:      IPA:      80/20:      OTHER:  PRIMARY: BCBS Medicare       Policy#: [id number]      Subscriber: Self  CM Name: Josephina Gip       Phone#: [phone]     Fax#: [phone] Pre-Cert#:               Employer: Retired  Benefits:  Phone #: [phone]     Name: Delice Lesch. Date: 05/27/15     Deduct: $0      Out of Pocket Max: [id number]      Life Max: N/A CIR: $300 a day, days 1-6; $0 for days 7+      SNF: $0 a day, days 1-20; $164.50 a day, days 21-100 Outpatient: PT/OT/SLP     Co-Pay: $40 a visit  Home Health: 100%       Co-Pay: 0 DME: 80%     Co-Pay: 20%  Providers: in network   SECONDARY:       Policy#:       Subscriber:  CM Name:       Phone#:      Fax#:  Pre-Cert#:       Employer:  Benefits:  Phone #:      Name:  Eff. 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   History of Present Illness: [name] an 82 y.o.right handed femalewith history of diabetes mellitus, hypertension. Per chart review patient lives alone and was independent and still driving short distances prior to admission. Presented 04/22/2016 with altered mental status as well as decreased vision on the left. Daughter had reported she had  difficulty with her balance for the past several weeks as well as occasional incontinence of her bowel. Cranial CT scan as well as CT cervical spine negative for acute changes. There was moderate periventricular, subcortical and deep white matter small vessel disease identified on cranial CT. Patient did not receive TPA. CTA of head and neck with no significant stenosis or large vessel occlusion. MRI of the brain 04/24/2016 showed no acute findings or changes compared to February 2017. Microhemorrhages in the cortex congestive of possible amyloid angiopathy. Carotid Doppler showed no ICA stenosis. EEG showed no seizure activity. She remained on Keppra for seizure prophylaxis. Echocardiogram pending. Neurology consulted suspect right brain infarct with workup currently ongoing maintained on aspirin for CVA prophylaxis as well as  subcutaneous Lovenox for DVT prophylaxis. Tolerating a regular consistency diet. Physical therapy evaluation completed with recommendations of physical medicine rehabilitation consult. Patient was admitted for a comprehensive rehabilitation program.  NIH Total: 0    Past Medical History  Past Medical History:  Diagnosis Date  . ABSCESS 12/18/2009  . DIABETES MELLITUS, TYPE II 05/22/2009  . Diverticulosis   . Hemorrhoids   . HYPERGLYCEMIA 05/22/2009  . HYPERTENSION 04/13/2007  . HYPOTHYROIDISM 04/13/2007  . NEOPLASM, SKIN, UNCERTAIN BEHAVIOR [id number]  . Tubular adenoma of colon 02/2001    Family History  family history includes Huntington's disease (age of onset: 40) in her daughter; Hypertension in her mother; Stroke in her father and sister.  Prior Rehab/Hospitalizations:  Has the patient had major surgery during 100 days prior to admission? No  Current Medications   Current Facility-Administered Medications:  .  acetaminophen (TYLENOL) tablet 650 mg, 650 mg, Oral, Q6H PRN, Gardiner Barefoot, NP, 650 mg at 04/24/16 1438 .  aspirin suppository 300 mg, 300 mg,  Rectal, Daily, 300 mg at 04/22/16 2345 **OR** aspirin tablet 325 mg, 325 mg, Oral, Daily, Etta Quill, DO, 325 mg at 04/25/16 0916 .  atorvastatin (LIPITOR) tablet 40 mg, 40 mg, Oral, Daily, Jonetta Osgood, MD, 40 mg at 04/25/16 0916 .  enoxaparin (LOVENOX) injection 40 mg, 40 mg, Subcutaneous, QHS, Jared M Gardner, DO, 40 mg at 04/24/16 2145 .  insulin aspart (novoLOG) injection 0-15 Units, 0-15 Units, Subcutaneous, TID WC, Etta Quill, DO, 3 Units at 04/25/16 1140 .  levETIRAcetam (KEPPRA XR) 24 hr tablet 500 mg, 500 mg, Oral, Daily, Garvin Fila, MD, 500 mg at 04/25/16 0919 .  levothyroxine (SYNTHROID, LEVOTHROID) tablet 175 mcg, 175 mcg, Oral, QAC breakfast, Etta Quill, DO, 175 mcg at 04/25/16 0815 .  LORazepam (ATIVAN) injection 1 mg, 1 mg, Intravenous, Once PRN, Jonetta Osgood, MD .  ondansetron [facility]) injection 4 mg, 4 mg, Intravenous, Q6H PRN, Jonetta Osgood, MD, 4 mg at 04/24/16 1438 .  pantoprazole (PROTONIX) EC tablet 80 mg, 80 mg, Oral, Daily, Etta Quill, DO, 80 mg at 04/25/16 0916 .  sertraline (ZOLOFT) tablet 100 mg, 100 mg, Oral, Daily, Etta Quill, DO, 100 mg at 04/25/16 [id number]  Patients Current Diet: Diet Carb Modified Fluid consistency: Thin; Room service appropriate? Yes Diet - low sodium heart healthy Diet Carb Modified  Precautions / Restrictions Precautions Precautions: Fall Precaution Comments: h/o at least 3 falls in past 1 year per family, pt resistant to using device Restrictions Weight Bearing Restrictions: No   Has the patient had 2 or more falls or a fall with injury in the past year?Yes, daughter reports about 6; patient reports 2 and 1 when she hit her head.  Prior Activity Level Community (5-7x/wk): Prior to admission patient lived alone in a condo.  She was out in the community daily, driving, running errands, and she was very active in her church.   Home Assistive Devices / Equipment Home Assistive Devices/Equipment: Cane  (specify quad or straight) Home Equipment: Walker - 2 wheels  Prior Device Use: Indicate devices/aids used by the patient prior to current illness, exacerbation or injury? None of the above  Prior Functional Level Prior Function Level of Independence: Independent Comments: family reports pt has had several falls in past year and has had concerns about her safety; pt drove and did her own shopping PTA, son stating she was only supposed to drive short distances  Self Care: Did the patient  need help bathing, dressing, using the toilet or eating? Independent  Indoor Mobility: Did the patient need assistance with walking from room to room (with or without device)? Independent  Stairs: Did the patient need assistance with internal or external stairs (with or without device)? Needed some help  Functional Cognition: Did the patient need help planning regular tasks such as shopping or remembering to take medications? Independent  Current Functional Level Cognition  Arousal/Alertness: Lethargic Overall Cognitive Status: Impaired/Different from baseline Current Attention Level: Focused (highly distractable) Orientation Level: Oriented X4 Following Commands: Follows one step commands with increased time (and multimodal cues) Safety/Judgement: Decreased awareness of safety, Decreased awareness of deficits General Comments: Per family discussion, pt with poor awareness of deficits prior to this incident Attention: Sustained Sustained Attention: Impaired Sustained Attention Impairment: Verbal basic, Functional basic Memory: Impaired Memory Impairment: Decreased recall of new information Awareness: Impaired Awareness Impairment: Intellectual impairment Problem Solving: Appears intact (for basic information unrelated to deficits) Safety/Judgment: Impaired Comments: decreased safety awareness related to deficits    Extremity Assessment (includes Sensation/Coordination)  Upper Extremity  Assessment: RUE deficits/detail, LUE deficits/detail RUE Deficits / Details: grossly 4/5 LUE Deficits / Details: strength grossly 4/5, poor coordination, grasping at bed sheets and clothing, mistaked OT's arm for her own  LUE Sensation: decreased proprioception LUE Coordination: decreased fine motor, decreased gross motor  Lower Extremity Assessment: Defer to PT evaluation LLE Deficits / Details: difficult to fully assess due to lethargy, pt stated she could not feel light touch to L foot, no purposeful movement observed during eval, she wasn't able to actively extend L knee when asked to do so  LLE Sensation: decreased light touch    ADLs  Overall ADL's : Needs assistance/impaired Eating/Feeding: Minimal assistance, Bed level Grooming: Wash/dry face, Sitting, Min guard Upper Body Bathing: Maximal assistance, Sitting Lower Body Bathing: +2 for physical assistance, Total assistance, Sit to/from stand Upper Body Dressing : Moderate assistance, Sitting Lower Body Dressing: +2 for physical assistance, Total assistance, Sit to/from stand    Mobility  Overal bed mobility: +2 for physical assistance, Needs Assistance Bed Mobility: Supine to Sit, Sit to Supine Supine to sit: +2 for physical assistance, Mod assist Sit to supine: +2 for physical assistance, Max assist General bed mobility comments: very inefficient movement, requiring assist for trunk and LEs    Transfers  Overall transfer level: Needs assistance Transfers: Sit to/from Stand Sit to Stand: +2 physical assistance, Mod assist General transfer comment: pt stood from bed x 20 seconds, assist to rise and steady and to place L hand on walker, pt with nausea, requiring return to sitting     Ambulation / Gait / Stairs / Wheelchair Mobility       Posture / Balance Dynamic Sitting Balance Sitting balance - Comments: posterior lean, sat EOB approx 10 minutes Balance Overall balance assessment: Needs assistance Sitting-balance  support: No upper extremity supported, Feet supported Sitting balance-Leahy Scale: Poor Sitting balance - Comments: posterior lean, sat EOB approx 10 minutes Standing balance support: Bilateral upper extremity supported Standing balance-Leahy Scale: Poor    Special needs/care consideration BiPAP/CPAP: No CPM: No Continuous Drip IV: No Dialysis: No         Life Vest: No Oxygen: No Special Bed: No Trach Size: No Wound Vac (area): No       Skin: Skin tear right arm; Rash back  Bowel mgmt: 06/24/15 Bladder mgmt: Intermittent incontinent with urgency and an odor per chart review  Diabetic mgmt: HgbA1c 7.1, yes a new diabetic per patient report      Previous Home Environment Living Arrangements: Alone  Lives With: Alone Available Help at Discharge: Family, Friend(s) Type of Home: House Home Layout: One level Home Access: Stairs to enter Technical brewer of Steps: 1 Bathroom Shower/Tub: Multimedia programmer: [place]: No  Discharge Living Setting Plans for Discharge Living Setting: Other (Comment) (Family is looking for our support and encouragment for ALF) Type of Home at Discharge: Other (Comment) (TBD likely ALF) Does the patient have any problems obtaining your medications?: No  Social/Family/Support Systems Patient Roles: Parent, Psychologist, occupational, Other (Comment) (grandparent ) Contact Information: Daughter: Hulan Saas [phone] Anticipated Caregiver: Daughter: Devin Going [facility]) [phone] Anticipated Caregiver's Contact Information: see above  Ability/Limitations of Caregiver: patient wants to maintain her independence  Caregiver Availability: Intermittent Is Caregiver In Agreement with Plan?: Yes Does Caregiver/Family have Issues with Lodging/Transportation while Pt is in Rehab?: No  Goals/Additional Needs Patient/Family Goal for Rehab: PT/OT/SLP Mod I-Supervision  Expected length of stay: 12-16  days  Cultural Considerations: Attends [employer] on [address]  Dietary Needs: Carb. Mod. Equipment Needs: TBD Special Service Needs: None Additional Information: Family is encouraging patient to transition to ALF at [facility]. Pt/Family Agrees to Admission and willing to participate: Yes Program Orientation Provided & Reviewed with Pt/Caregiver Including Roles  & Responsibilities: Yes Additional Information Needs: See above regarding ALF Information Needs to be Provided By: CSW as needed   Decrease burden of Care through IP rehab admission: No  Possible need for SNF placement upon discharge: No  Patient Condition: This patient's condition remains as documented in the consult dated 04/24/16 at 0941, in which the Rehabilitation Physician determined and documented that the patient's condition is appropriate for intensive rehabilitative care in an inpatient rehabilitation facility. Will admit to inpatient rehab today.   Preadmission Screen Completed By:  Gunnar Fusi, 04/25/2016 1:34 PM ______________________________________________________________________   Discussed status with Dr. Naaman Plummer on 04/25/16 at 1334 and received telephone approval for admission today.  Admission Coordinator:  Gunnar Fusi, time 1334/Date 04/25/16

## 2016-04-25 NOTE — Progress Notes (Signed)
Gunnar Fusi Re[facility] Admission Coordinator Signed Physical Medicine and Rehabilitation  PMR Pre-admission Date of Service: 04/25/2016 12:04 PM  Related encounter: ED to Hosp-Admission (Current) from 04/22/2016 in [place]       [] Hide copied text PMR Admission Coordinator Pre-Admission Assessment  Patient: Amy [name] is an 82 y.o., female MRN: [id number] DOB: [birth year] Height: 5\' 6"  (167.6 cm) Weight: 74.8 kg (165 lb)                                                                                                                                                  Insurance Information HMO: X    PPO:      PCP:      IPA:      80/20:      OTHER:  PRIMARY: BCBS Medicare       Policy#: [id number]      Subscriber: Self  CM Name: Josephina Gip       Phone#: [phone]     Fax#: [phone] Pre-Cert#:               Employer: Retired  Benefits:  Phone #: [phone]     Name: Delice Lesch. Date: 05/27/15     Deduct: $0      Out of Pocket Max: [phone]      Life Max: N/A CIR: $300 a day, days 1-6; $0 for days 7+      SNF: $0 a day, days 1-20; $164.50 a day, days 21-100 Outpatient: PT/OT/SLP     Co-Pay: $40 a visit  Home Health: 100%       Co-Pay: 0 DME: 80%     Co-Pay: 20%  Providers: in network   SECONDARY:       Policy#:       Subscriber:  CM Name:       Phone#:      Fax#:  Pre-Cert#:       Employer:  Benefits:  Phone #:      Name:  Eff. 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   History of Present Illness: Amy [name] an 82 y.o.right handed femalewith history of diabetes mellitus, hypertension. Per chart review patient lives alone and was independent and still driving short distances prior to admission. Presented 04/22/2016 with altered mental status as well as decreased vision on the left. Daughter had reported she had difficulty with her balance for the past several weeks as well as occasional incontinence of her bowel. Cranial CT scan as well as CT cervical spine negative for acute changes. There was moderate periventricular, subcortical and deep white matter small vessel disease identified on cranial CT. Patient did not receive TPA. CTA of head and neck with no significant stenosis or large vessel occlusion. MRI of the brain 04/24/2016 showed no acute findings or changes compared to February 2017. Microhemorrhages in the cortex congestive of possible amyloid angiopathy.Carotid Doppler showed no ICA stenosis. EEG showed no seizure activity. She remained on Keppra for seizure prophylaxis.Echocardiogram pending. Neurology consulted suspect right brain infarct with workup currently ongoing maintained on aspirin for CVA prophylaxis as well as subcutaneous Lovenox for DVT prophylaxis. Tolerating a regular consistency diet. Physical therapy evaluation completed with recommendations of physical medicine rehabilitation consult. Patient was admitted for a comprehensive rehabilitation program.  NIH Total: 0    Past Medical History      Past Medical History:  Diagnosis Date  . ABSCESS 12/18/2009  . DIABETES MELLITUS, TYPE II 05/22/2009  . Diverticulosis   . Hemorrhoids   . HYPERGLYCEMIA 05/22/2009  . HYPERTENSION 04/13/2007  . HYPOTHYROIDISM 04/13/2007  . NEOPLASM, SKIN, UNCERTAIN BEHAVIOR [id number]  . Tubular adenoma of colon 02/2001    Family History  family history includes  Amy [name] disease (age of onset: 37) in her daughter; Hypertension in her mother; Stroke in her father and sister.  Prior Rehab/Hospitalizations:  Has the patient had major surgery during 100 days prior to admission? No  Current Medications   Current Facility-Administered Medications:  .  acetaminophen (TYLENOL) tablet 650 mg, 650 mg, Oral, Q6H PRN, Gardiner Barefoot, NP, 650 mg at 04/24/16 1438 .  aspirin suppository 300 mg, 300 mg, Rectal, Daily, 300 mg at 04/22/16 2345 **OR** aspirin tablet 325 mg, 325 mg, Oral, Daily, Etta Quill, DO, 325 mg at 04/25/16 0916 .  atorvastatin (LIPITOR) tablet 40 mg, 40 mg, Oral, Daily, Jonetta Osgood, MD, 40 mg at 04/25/16 0916 .  enoxaparin (LOVENOX) injection 40 mg, 40 mg, Subcutaneous, QHS, Jared M Gardner, DO, 40 mg at 04/24/16 2145 .  insulin aspart (novoLOG) injection 0-15 Units, 0-15 Units, Subcutaneous, TID WC, Etta Quill, DO, 3 Units at 04/25/16 1140 .  levETIRAcetam (KEPPRA XR) 24 hr tablet 500 mg, 500 mg, Oral, Daily, Garvin Fila, MD, 500 mg at 04/25/16 0919 .  levothyroxine (SYNTHROID, LEVOTHROID) tablet 175 mcg, 175 mcg, Oral, QAC breakfast, Etta Quill, DO, 175 mcg at 04/25/16 0815 .  LORazepam (ATIVAN) injection 1 mg, 1 mg, Intravenous, Once PRN, Jonetta Osgood, MD .  ondansetron [facility]) injection 4 mg, 4 mg, Intravenous, Q6H PRN, Jonetta Osgood, MD, 4 mg at 04/24/16 1438 .  pantoprazole (PROTONIX) EC tablet 80 mg, 80 mg, Oral, Daily, Etta Quill, DO, 80 mg at 04/25/16 0916 .  sertraline (ZOLOFT) tablet 100 mg, 100 mg, Oral, Daily, Etta Quill, DO, 100 mg at 04/25/16 [id number]  Patients Current Diet: Diet Carb Modified Fluid consistency: Thin; Room service appropriate? Yes Diet - low sodium heart healthy Diet Carb Modified  Precautions / Restrictions Precautions Precautions:  Fall Precaution Comments: h/o at least 3 falls in past 1 year per family, pt resistant to using device Restrictions Weight  Bearing Restrictions: No   Has the patient had 2 or more falls or a fall with injury in the past year?Yes, daughter reports about 6; patient reports 2 and 1 when she hit her head.  Prior Activity Level Community (5-7x/wk): Prior to admission patient lived alone in a condo.  She was out in the community daily, driving, running errands, and she was very active in her church.   Home Assistive Devices / Equipment Home Assistive Devices/Equipment: Cane (specify quad or straight) Home Equipment: Walker - 2 wheels  Prior Device Use: Indicate devices/aids used by the patient prior to current illness, exacerbation or injury? None of the above  Prior Functional Level Prior Function Level of Independence: Independent Comments: family reports pt has had several falls in past year and has had concerns about her safety; pt drove and did her own shopping PTA, son stating she was only supposed to drive short distances  Self Care: Did the patient need help bathing, dressing, using the toilet or eating? Independent  Indoor Mobility: Did the patient need assistance with walking from room to room (with or without device)? Independent  Stairs: Did the patient need assistance with internal or external stairs (with or without device)? Needed some help  Functional Cognition: Did the patient need help planning regular tasks such as shopping or remembering to take medications? Independent  Current Functional Level Cognition Arousal/Alertness: Lethargic Overall Cognitive Status: Impaired/Different from baseline Current Attention Level: Focused (highly distractable) Orientation Level: Oriented X4 Following Commands: Follows one step commands with increased time (and multimodal cues) Safety/Judgement: Decreased awareness of safety, Decreased awareness of deficits General Comments: Per family discussion, pt with poor awareness of deficits prior to this incident Attention: Sustained Sustained  Attention: Impaired Sustained Attention Impairment: Verbal basic, Functional basic Memory: Impaired Memory Impairment: Decreased recall of new information Awareness: Impaired Awareness Impairment: Intellectual impairment Problem Solving: Appears intact (for basic information unrelated to deficits) Safety/Judgment: Impaired Comments: decreased safety awareness related to deficits    Extremity Assessment (includes Sensation/Coordination) Upper Extremity Assessment: RUE deficits/detail, LUE deficits/detail RUE Deficits / Details: grossly 4/5 LUE Deficits / Details: strength grossly 4/5, poor coordination, grasping at bed sheets and clothing, mistaked OT's arm for her own  LUE Sensation: decreased proprioception LUE Coordination: decreased fine motor, decreased gross motor  Lower Extremity Assessment: Defer to PT evaluation LLE Deficits / Details: difficult to fully assess due to lethargy, pt stated she could not feel light touch to L foot, no purposeful movement observed during eval, she wasn't able to actively extend L knee when asked to do so  LLE Sensation: decreased light touch   ADLs Overall ADL's : Needs assistance/impaired Eating/Feeding: Minimal assistance, Bed level Grooming: Wash/dry face, Sitting, Min guard Upper Body Bathing: Maximal assistance, Sitting Lower Body Bathing: +2 for physical assistance, Total assistance, Sit to/from stand Upper Body Dressing : Moderate assistance, Sitting Lower Body Dressing: +2 for physical assistance, Total assistance, Sit to/from stand   Mobility Overal bed mobility: +2 for physical assistance, Needs Assistance Bed Mobility: Supine to Sit, Sit to Supine Supine to sit: +2 for physical assistance, Mod assist Sit to supine: +2 for physical assistance, Max assist General bed mobility comments: very inefficient movement, requiring assist for trunk and LEs   Transfers Overall transfer level: Needs assistance Transfers: Sit to/from Stand Sit to  Stand: +2 physical assistance, Mod assist General transfer comment:  pt stood from bed x 20 seconds, assist to rise and steady and to place L hand on walker, pt with nausea, requiring return to sitting    Ambulation / Gait / Stairs / Wheelchair Mobility     Posture / Balance Dynamic Sitting Balance Sitting balance - Comments: posterior lean, sat EOB approx 10 minutes Balance Overall balance assessment: Needs assistance Sitting-balance support: No upper extremity supported, Feet supported Sitting balance-Leahy Scale: Poor Sitting balance - Comments: posterior lean, sat EOB approx 10 minutes Standing balance support: Bilateral upper extremity supported Standing balance-Leahy Scale: Poor   Special needs/care consideration BiPAP/CPAP: No CPM: No Continuous Drip IV: No Dialysis: No         Life Vest: No Oxygen: No Special Bed: No Trach Size: No Wound Vac (area): No       Skin: Skin tear right arm; Rash back                                Bowel mgmt: 06/24/15 Bladder mgmt: Intermittent incontinent with urgency and an odor per chart review  Diabetic mgmt: [id number], yes a new diabetic per patient report     Previous Home Environment Living Arrangements: Alone  Lives With: Alone Available Help at Discharge: Family, Friend(s) Type of Home: House Home Layout: One level Home Access: Stairs to enter Technical brewer of Steps: 1 Bathroom Shower/Tub: Multimedia programmer: Standard Home Care Services: No  Discharge Living Setting Plans for Discharge Living Setting: Other (Comment) (Family is looking for our support and encouragment for ALF) Type of Home at Discharge: Other (Comment) (TBD likely ALF) Does the patient have any problems obtaining your medications?: No  Social/Family/Support Systems Patient Roles: Parent, Psychologist, occupational, Other (Comment) (grandparent ) Contact Information: Daughter: Hulan Saas [phone] Anticipated Caregiver: Daughter: Devin Going [facility]) [phone] Anticipated Caregiver's Contact Information: see above  Ability/Limitations of Caregiver: patient wants to maintain her independence  Caregiver Availability: Intermittent Is Caregiver In Agreement with Plan?: Yes Does Caregiver/Family have Issues with Lodging/Transportation while Pt is in Rehab?: No  Goals/Additional Needs Patient/Family Goal for Rehab: PT/OT/SLP Mod I-Supervision  Expected length of stay: 12-16 days  Cultural Considerations: Attends [employer] on [address]  Dietary Needs: Carb. Mod. Equipment Needs: TBD Special Service Needs: None Additional Information: Family is encouraging patient to transition to ALF at [facility]. Pt/Family Agrees to Admission and willing to participate: Yes Program Orientation Provided & Reviewed with Pt/Caregiver Including Roles  & Responsibilities: Yes Additional Information Needs: See above regarding ALF Information Needs to be Provided By: CSW as needed   Decrease burden of Care through IP rehab admission: No  Possible need for SNF placement upon discharge: No  Patient Condition: This patient's condition remains as documented in the consult dated 04/24/16 at 0941, in which the Rehabilitation Physician determined and documented that the patient's condition is appropriate for intensive rehabilitative care in an inpatient rehabilitation facility. Will admit to inpatient rehab today.   Preadmission Screen Completed By:  Gunnar Fusi, 04/25/2016 1:34 PM ______________________________________________________________________   Discussed status with Dr. Naaman Plummer on 04/25/16 at 1334 and received telephone approval for admission today.  Admission Coordinator:  Gunnar Fusi, time 1334/Date 04/25/16       Cosigned by: Meredith Staggers, MD at 04/25/2016 1:35 PM  Revision History

## 2016-04-25 NOTE — Progress Notes (Signed)
Patient and family were informed about rehab process including patient safety plan and rehab booklet. 

## 2016-04-26 ENCOUNTER — Inpatient Hospital Stay (HOSPITAL_COMMUNITY): Payer: Medicare Other

## 2016-04-26 ENCOUNTER — Inpatient Hospital Stay (HOSPITAL_COMMUNITY): Payer: Medicare Other | Admitting: Physical Therapy

## 2016-04-26 ENCOUNTER — Inpatient Hospital Stay (HOSPITAL_COMMUNITY): Payer: Medicare Other | Admitting: Speech Pathology

## 2016-04-26 DIAGNOSIS — E039 Hypothyroidism, unspecified: Secondary | ICD-10-CM

## 2016-04-26 LAB — GLUCOSE, CAPILLARY
GLUCOSE-CAPILLARY: 147 mg/dL — AB (ref 65–99)
GLUCOSE-CAPILLARY: 147 mg/dL — AB (ref 65–99)
GLUCOSE-CAPILLARY: 157 mg/dL — AB (ref 65–99)
Glucose-Capillary: 115 mg/dL — ABNORMAL HIGH (ref 65–99)
Glucose-Capillary: 161 mg/dL — ABNORMAL HIGH (ref 65–99)

## 2016-04-26 MED ORDER — LEVETIRACETAM 500 MG PO TABS
ORAL_TABLET | ORAL | Status: AC
  Filled 2016-04-26: qty 1

## 2016-04-26 NOTE — Evaluation (Signed)
Speech Language Pathology Assessment and Plan  Patient Details  Name: Amy [name] MRN: [id number] Date of Birth: [birth year]  SLP Diagnosis: Cognitive Impairments  Rehab Potential: Good ELOS: 7 to 10 days   Today's Date: 04/26/2016 SLP Individual Time: 1330-1440 SLP Individual Time Calculation (min): 70 min    Problem List: Patient Active Problem List   Diagnosis Date Noted  . 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 Cerebral thrombosis with cerebral infarction 04/23/2016  . Left homonymous hemianopsia 04/22/2016  . Dizziness and giddiness 07/27/2015  . Balance disorder 07/27/2015  . Depression 03/12/2015  . Abdominal pain, generalized 01/30/2015  . LUQ abdominal pain 08/19/2014  . Personal history of colonic polyps 06/18/2011  . NEOPLASM, SKIN, UNCERTAIN BEHAVIOR 79/39/0300  . Diabetes mellitus without complication ([place]) 92/33/0076  . HYPOTHYROIDISM 04/13/2007  . Essential hypertension 04/13/2007   Past Medical History:  Past Medical History:  Diagnosis Date  . ABSCESS 12/18/2009  . DIABETES MELLITUS, TYPE II 05/22/2009  . Diverticulosis   . Hemorrhoids   . HYPERGLYCEMIA 05/22/2009  . HYPERTENSION 04/13/2007  . HYPOTHYROIDISM 04/13/2007  . NEOPLASM, SKIN, UNCERTAIN BEHAVIOR 07/21/3333  . Tubular adenoma of colon 02/2001   Past Surgical History:  Past Surgical History:  Procedure Laterality Date  . ABDOMINAL HYSTERECTOMY    . APPENDECTOMY    . CHOLECYSTECTOMY    . COLONOSCOPY  04-05-04   per Dr. Deatra Ina, no polyps   . OOPHORECTOMY    . THYROIDECTOMY     secondary to nodule, no cancer  . TONSILLECTOMY AND ADENOIDECTOMY      Assessment / Plan / Recommendation Clinical Impression Amy [name] KT64 y.o.right handed femalewith history of diabetes mellitus, hypertension. Per chart review patient lives alone and was independent and still driving short distances prior to admission. Presented 04/22/2016 with altered mental  status as well as decreased vision on the left. Daughter had reported she had difficulty with her balance for the past several weeks as well as occasional incontinence of her bowel. Cranial CT scan as well as CT cervical spine negative for acute changes. There was moderate periventricular, subcortical and deep white matter small vessel disease identified on cranial CT. Patient did not receive TPA. CTA of head and neck with no significant stenosis or large vessel occlusion. MRI of the brain 04/24/2016 showed no acute findings or changes compared to February 2017. Microhemorrhages in the cortex congestive of possible amyloid angiopathy.Carotid Doppler showed no ICA stenosis. EEG showed no seizure activity. She remained on Keppra for seizure prophylaxis.Echocardiogram pending. Neurology consulted suspect right brain infarct with workup currently ongoing maintained on aspirin for CVA prophylaxis as well as subcutaneous Lovenox for DVT prophylaxis. Tolerating a regular consistency diet. Physical therapy evaluation completed with recommendations of physical medicine rehabilitation consult. Patient was admitted for a comprehensive rehabilitation program on 04/25/16.   Cognitive Linguistic Evaluation was completed on 04/26/16 revealed moderate deficits largely characterized by severe deficits in sustained attention and significantly impaired awareness of deficits. These deficits her ability to retain information, execute problem solving and her severe deficits in intellectual awareness place her at increased safety risk to return home independently. Of note, pt continues to tolerate regular diet without any overt s/s of aspiration per family report. Pt would benefit from skilled SLP intervention in order to maximize her functional independence prior to discharge. Anticipate that pt will require 24 hour supervision at time of discharge.   Skilled Therapeutic Interventions          Cognitive  Linguistic Evaluation completed  and results reviewed with daughter and pt. Pt with no ability to demonstrate comprehension or awareness of described deficits. Pt with multiple off-topic responses to education and plan of care. She required Max A verbal encouragement for participation and completion of tasks. Daughter provided examples of impaired safety within the home for 1 year prior to this hospitalization. Daughter would arrive at pt's house, find hot water running in sink and pt would be at neighbor's house next door. Pt with decreased awareness to safety as she would respond "at least the stopper wasn't in the drain." Education provided on current deficits and need for increased supervision upon discharge d/t acute on chronic nature of memory and safety deficits. .  .    SLP Assessment  Patient will need skilled Speech Lanaguage Pathology Services during CIR admission    Recommendations  SLP Diet Recommendations: Age appropriate regular solids;Thin Liquid Administration via: Straw Medication Administration: Whole meds with liquid Supervision: Patient able to self feed Postural Changes and/or Swallow Maneuvers: Seated upright 90 degrees Oral Care Recommendations: Oral care BID Patient destination: Assisted Living Follow up Recommendations: 24 hour supervision/assistance Equipment Recommended: None recommended by SLP    SLP Frequency 3 to 5 out of 7 days   SLP Duration  SLP Intensity  SLP Treatment/Interventions    Minumum of 1-2 x/day, 30 to 90 minutes  Functional tasks;Patient/family education;Cognitive remediation/compensation;Cueing hierarchy;Therapeutic Activities;Internal/external aids    Pain    Prior Functioning Cognitive/Linguistic Baseline: Baseline deficits Baseline deficit details: Daughter present and reports memory deficits for at least 1 year prior to recent CVA. Was concerned about pt's ability to drive and live independently for at least 1 year prior.  Type of Home: House  Lives With:  Alone Available Help at Discharge: Family;Friend(s) (However, consistent caregivers are not available. Would need to employe for care.) Education: RN Vocation: Retired  Function:   Cognition Comprehension Comprehension assist level: Understands basic 75 - 89% of the time/ requires cueing 10 - 24% of the time (Attention deficits maybe impacting ability level. )  Expression   Expression assist level: Expresses basic 90% of the time/requires cueing < 10% of the time.  Social Interaction Social Interaction assist level: Interacts appropriately 90% of the time - Needs monitoring or encouragement for participation or interaction.  Problem Solving Problem solving assist level: Solves basic 50 - 74% of the time/requires cueing 25 - 49% of the time (Attention deficits may be impacting ability)  Memory Memory assist level: Recognizes or recalls 50 - 74% of the time/requires cueing 25 - 49% of the time (Attention deficits maybe impacting ability level)   Short Term Goals: Week 1: SLP Short Term Goal 1 (Week 1): Pt will demonstrate sustained attention to functional tasks for ~5 minutes with Mod A verbal cues for redirection.  SLP Short Term Goal 2 (Week 1): Pt will demonstrate intellectual awareness by stating 1 cognitive deficits related to current CVA with Mod A verbal cues.  SLP Short Term Goal 3 (Week 1): Pt will demonstrate functional problem solving for mildly complex tasks with Mod A verbal cues.  SLP Short Term Goal 4 (Week 1): Pt will recall and follow 3 step directions with 75% accuracy and Mod A verbal cues.   Refer to Care Plan for Long Term Goals  Recommendations for other services: None  Discharge Criteria: Patient will be discharged from SLP if patient refuses treatment 3 consecutive times without medical reason, if treatment goals not met, if there is a change  in medical status, if patient makes no progress towards goals or if patient is discharged from hospital.  The above  assessment, treatment plan, treatment alternatives and goals were discussed and mutually agreed upon: by patient and family.  Rex Oesterle 04/26/2016, 4:23 PM

## 2016-04-26 NOTE — Evaluation (Signed)
Physical Therapy Assessment and Plan  Patient Details  Name: Amy [name] MRN: [id number] Date of Birth: 07/20/1929  PT Diagnosis: Abnormality of gait, Coordination disorder, Difficulty walking and Muscle weakness Rehab Potential: Good ELOS: 7-10 days    Today's Date: 04/26/2016 PT Individual Time: 1000-1111 PT Individual Time Calculation (min): 71 min     Problem List: Patient Active Problem List   Diagnosis Date Noted  . 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 Cerebral thrombosis with cerebral infarction 04/23/2016  . Left homonymous hemianopsia 04/22/2016  . Dizziness and giddiness 07/27/2015  . Balance disorder 07/27/2015  . Depression 03/12/2015  . Abdominal pain, generalized 01/30/2015  . LUQ abdominal pain 08/19/2014  . Personal history of colonic polyps 06/18/2011  . NEOPLASM, SKIN, UNCERTAIN BEHAVIOR 40/98/1191  . Diabetes mellitus without complication ([place]) 47/82/9562  . HYPOTHYROIDISM 04/13/2007  . Essential hypertension 04/13/2007    Past Medical History:  Past Medical History:  Diagnosis Date  . ABSCESS 12/18/2009  . DIABETES MELLITUS, TYPE II 05/22/2009  . Diverticulosis   . Hemorrhoids   . HYPERGLYCEMIA 05/22/2009  . HYPERTENSION 04/13/2007  . HYPOTHYROIDISM 04/13/2007  . NEOPLASM, SKIN, UNCERTAIN BEHAVIOR 06/25/8655  . Tubular adenoma of colon 02/2001   Past Surgical History:  Past Surgical History:  Procedure Laterality Date  . ABDOMINAL HYSTERECTOMY    . APPENDECTOMY    . CHOLECYSTECTOMY    . COLONOSCOPY  04-05-04   per Dr. Deatra Ina, no polyps   . OOPHORECTOMY    . THYROIDECTOMY     secondary to nodule, no cancer  . TONSILLECTOMY AND ADENOIDECTOMY      Assessment & Plan Clinical Impression: Patient is an 82 y.o.right handed femalewith history of diabetes mellitus, hypertension. Per chart review patient lives alone and was independent and still driving short distances prior to admission. Presented  04/22/2016 with altered mental status as well as decreased vision on the left. Daughter had reported she had difficulty with her balance for the past several weeks as well as occasional incontinence of her bowel. Cranial CT scan as well as CT cervical spine negative for acute changes. There was moderate periventricular, subcortical and deep white matter small vessel disease identified on cranial CT. Patient did not receive TPA. CTA of head and neck with no significant stenosis or large vessel occlusion.MRI of the brain 04/24/2016 showed no acute findings or changes compared to February 2017. Microhemorrhages in the cortex congestive of possible amyloid angiopathy.Carotid Doppler showed no ICA stenosis.EEG showed no seizure activity. She remained on Keppra for seizure prophylaxis.Echocardiogram pending. Neurology consulted suspect right brain infarct with workup currently ongoing maintained on aspirin for CVA prophylaxis as well as subcutaneous Lovenox for DVT prophylaxis.  Patient transferred to CIR on 04/25/2016 .   Patient currently requires min with mobility secondary to muscle weakness, decreased awareness, decreased problem solving and decreased memory and decreased standing balance, hemiplegia and decreased balance strategies.  Prior to hospitalization, patient was modified independent  with mobility and lived with Alone in a House home.  Home access is 1Stairs to enter.  Patient will benefit from skilled PT intervention to maximize safe functional mobility, minimize fall risk and decrease caregiver burden for planned discharge home with 24 hour supervision.  Anticipate patient will benefit from follow up [place] at discharge.  PT - End of Session Activity Tolerance: Tolerates 10 - 20 min activity with multiple rests Endurance Deficit: Yes PT Assessment Rehab Potential (ACUTE/IP ONLY): Good Barriers to Discharge: [place] home environment;Decreased caregiver support  PT Patient demonstrates  impairments in the following area(s): Balance;Behavior;Endurance;Safety PT Transfers Functional Problem(s): Bed Mobility;Bed to Chair;Car;Furniture;Floor PT Locomotion Functional Problem(s): Ambulation;Wheelchair Mobility;Stairs PT Plan PT Intensity: Minimum of 1-2 x/day ,45 to 90 minutes PT Frequency: 5 out of 7 days PT Duration Estimated Length of Stay: 7-10 days  PT Treatment/Interventions: Ambulation/gait training;Cognitive remediation/compensation;Community reintegration;Discharge planning;Disease management/prevention;Balance/vestibular training;DME/adaptive equipment instruction;Functional mobility training;Neuromuscular re-education;Patient/family education;Psychosocial support;Skin care/wound management;Pain management;Therapeutic Activities;Splinting/orthotics;Therapeutic Exercise;Stair training;UE/LE Strength taining/ROM;UE/LE Coordination activities;Visual/perceptual remediation/compensation;Wheelchair propulsion/positioning PT Transfers Anticipated Outcome(s): Supervision assist with LRAD PT Locomotion Anticipated Outcome(s): Supervision assist With LRAD  PT Recommendation Follow Up Recommendations: Home health PT Patient destination: Home Equipment Recommended: Rolling walker with 5" wheels     Skilled Therapeutic Intervention Patient received supine in bed and agreeable to PT. PT instructed patient in evaluation and initiated treatment intervention; see below for results. PT educated patient in basic transfers, gait with and without AD, WC mobility with BUE and BLE propulsion, car transfer, bed mobility, as well as plan of care, treatment interventions and d/c recommendations.  Patient returned to room and  left supine in bed with call bell in reach an all needs met.   PT Evaluation Precautions/Restrictions Precautions Precautions: Fall Precaution Comments: last fall in her yard Restrictions Weight Bearing Restrictions: No General Chart Reviewed: Yes Additional Pertinent  History: HTN, DMII Vital Signs Pain Pain Assessment Pain Assessment: 0-10 Pain Score: 3  Pain Type: Acute pain Pain Location: Head Pain Orientation: Lateral;Right Pain Descriptors / Indicators: Aching Home Living/Prior Functioning Home Living Available Help at Discharge: Family;Friend(s) (Housekeeper used as needed) Type of Home: House Home Access: Stairs to enter Technical brewer of Steps: 1 Entrance Stairs-Rails: None Home Layout: One level Bathroom Shower/Tub: Multimedia programmer: Standard Bathroom Accessibility: Yes  Lives With: Alone Prior Function Level of Independence: Independent with basic ADLs;Needs assistance with homemaking (Lives at [place]) Light Housekeeping: Moderate Yard Work: Total  Able to [employer]?: Yes Driving: Yes Vocation: Retired Comments: family reports pt has had several falls in past year and has had concerns about her safety; pt drove and did her own shopping PTA, son stating she was only supposed to drive short distances Vision/Perception    WFL for tasks assessed  Cognition Overall Cognitive Status: Impaired/Different from baseline Arousal/Alertness: Awake/alert Orientation Level: Oriented X4 Attention: Selective Sustained Attention: Appears intact Selective Attention: Impaired Selective Attention Impairment: Verbal complex Memory: Impaired Memory Impairment: Decreased short term memory Decreased Short Term Memory: Verbal complex Awareness: Impaired Awareness Impairment: Anticipatory impairment Problem Solving: Appears intact Safety/Judgment: Appears intact Sensation Sensation Light Touch: Appears Intact Proprioception: Appears Intact Coordination Gross Motor Movements are Fluid and Coordinated: Yes Fine Motor Movements are Fluid and Coordinated: Yes Motor  Motor Motor: Other (comment) Motor - Skilled Clinical Observations: Generalized weakness  Mobility Bed Mobility Bed Mobility: Rolling Right;Rolling  Left;Supine to Sit;Sit to Supine Rolling Right: 5: Supervision Rolling Right Details: Verbal cues for technique;Verbal cues for precautions/safety Rolling Left: 5: Supervision Rolling Left Details: Verbal cues for technique;Verbal cues for precautions/safety Supine to Sit: 5: Supervision Supine to Sit Details: Verbal cues for technique;Verbal cues for precautions/safety Sit to Supine: 5: Supervision Sit to Supine - Details: Verbal cues for technique;Verbal cues for precautions/safety Transfers Transfers: Yes Sit to Stand: 4: Min assist Sit to Stand Details: Verbal cues for technique;Verbal cues for precautions/safety;Tactile cues for weight shifting Stand Pivot Transfers: 4: Min assist Stand Pivot Transfer Details: Verbal cues for technique;Verbal cues for precautions/safety;Tactile cues for placement Locomotion  Ambulation Ambulation: Yes Ambulation/Gait Assistance: 3:  Mod assist;4: Min assist Ambulation Distance (Feet): 50 Feet Assistive device: None Gait Gait: Yes Gait Pattern: Impaired Gait Pattern: Step-through pattern;Narrow base of support Stairs / Additional Locomotion Stairs: Yes Stairs Assistance: 4: Min assist Stair Management Technique: Two rails Number of Stairs: 12 Height of Stairs: 6 Wheelchair Mobility Wheelchair Mobility: Yes Wheelchair Assistance: 4: Min Lexicographer: Both lower extermities Wheelchair Parts Management: Needs assistance Distance: 150f  Trunk/Postural Assessment  Cervical Assessment Cervical Assessment: Within FWater engineerThoracic Assessment: Within Functional Limits Lumbar Assessment Lumbar Assessment: Within Functional Limits Postural Control Postural Control: Deficits on evaluation Righting Reactions: delayed in standing.   Balance Balance Balance Assessed: Yes Static Sitting Balance Static Sitting - Balance Support: No upper extremity supported Static Sitting - Level of Assistance: 5:  Stand by assistance Dynamic Sitting Balance Dynamic Sitting - Balance Support: No upper extremity supported Dynamic Sitting - Level of Assistance: 5: Stand by assistance Static Standing Balance Static Standing - Balance Support: No upper extremity supported Static Standing - Level of Assistance: 4: Min assist Dynamic Standing Balance Dynamic Standing - Balance Support: No upper extremity supported Dynamic Standing - Level of Assistance: 3: Mod assist Extremity Assessment      RLE Assessment RLE Assessment: Within Functional Limits (4+/5 proximal to distal) LLE Assessment LLE Assessment: Within Functional Limits (4+/5 proximal to distal)   See Function Navigator for Current Functional Status.   Refer to Care Plan for Long Term Goals  Recommendations for other services: None  Discharge Criteria: Patient will be discharged from PT if patient refuses treatment 3 consecutive times without medical reason, if treatment goals not met, if there is a change in medical status, if patient makes no progress towards goals or if patient is discharged from hospital.  The above assessment, treatment plan, treatment alternatives and goals were discussed and mutually agreed upon: by patient  ALorie Phenix12/06/2015, 10:52 AM

## 2016-04-26 NOTE — Evaluation (Addendum)
Occupational Therapy Assessment and Plan  Patient Details  Name: Amy [name] MRN: [id number] Date of Birth: [birth year]  OT Diagnosis: cognitive deficits and muscle weakness (generalized) Rehab Potential: Rehab Potential (ACUTE ONLY): Good ELOS: 7-10 days   Today's Date: 04/26/2016 OT Individual Time: 0800-0900 OT Individual Time Calculation (min): 60 min     Problem List:  Patient Active Problem List   Diagnosis Date Noted  . 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 Cerebral thrombosis with cerebral infarction 04/23/2016  . Left homonymous hemianopsia 04/22/2016  . Dizziness and giddiness 07/27/2015  . Balance disorder 07/27/2015  . Depression 03/12/2015  . Abdominal pain, generalized 01/30/2015  . LUQ abdominal pain 08/19/2014  . Personal history of colonic polyps 06/18/2011  . NEOPLASM, SKIN, UNCERTAIN BEHAVIOR 46/27/0350  . Diabetes mellitus without complication ([place]) 09/38/1829  . HYPOTHYROIDISM 04/13/2007  . Essential hypertension 04/13/2007    Past Medical History:  Past Medical History:  Diagnosis Date  . ABSCESS 12/18/2009  . DIABETES MELLITUS, TYPE II 05/22/2009  . Diverticulosis   . Hemorrhoids   . HYPERGLYCEMIA 05/22/2009  . HYPERTENSION 04/13/2007  . HYPOTHYROIDISM 04/13/2007  . NEOPLASM, SKIN, UNCERTAIN BEHAVIOR 9/37/1696  . Tubular adenoma of colon 02/2001   Past Surgical History:  Past Surgical History:  Procedure Laterality Date  . ABDOMINAL HYSTERECTOMY    . APPENDECTOMY    . CHOLECYSTECTOMY    . COLONOSCOPY  04-05-04   per Dr. Deatra Ina, no polyps   . OOPHORECTOMY    . THYROIDECTOMY     secondary to nodule, no cancer  . TONSILLECTOMY AND ADENOIDECTOMY      Assessment & Plan Clinical Impression: Patient is an 82 y.o. right handed femalewith history of diabetes mellitus, hypertension. Per chart review patient lives alone and was independent and still driving short distances prior to admission. Presented  04/22/2016 with altered mental status as well as decreased vision on the left. Daughter had reported she had difficulty with her balance for the past several weeks as well as occasional incontinence of her bowel. Cranial CT scan as well as CT cervical spine negative for acute changes. There was moderate periventricular, subcortical and deep white matter small vessel disease identified on cranial CT. Patient did not receive TPA. CTA of head and neck with no significant stenosis or large vessel occlusion.MRI of the brain 04/24/2016 showed no acute findings or changes compared to February 2017. Microhemorrhages in the cortex congestive of possible amyloid angiopathy.Carotid Doppler showed no ICA stenosis.EEG showed no seizure activity. She remained on Keppra for seizure prophylaxis.Echocardiogram pending. Neurology consulted suspect right brain infarct with workup currently ongoing maintained on aspirin for CVA prophylaxis as well as subcutaneous Lovenox for DVT prophylaxis. Tolerating a regular consistency diet. Physical therapy evaluation completed with recommendations of physical medicine rehabilitation consult.    Patient transferred to CIR on 04/25/2016 .    Patient currently requires mod with basic self-care skills secondary to muscle weakness and decreased safety awareness and delayed processing.  Prior to hospitalization, patient could complete BADL and iADL independently.   Patient will benefit from skilled intervention to increase independence with basic self-care skills and increase level of independence with iADL prior to discharge home with care partner.  Anticipate patient will require intermittent supervision and follow up home health.  OT - End of Session Activity Tolerance: Tolerates 30+ min activity with multiple rests Endurance Deficit: Yes OT Assessment Rehab Potential (ACUTE ONLY): Good OT Patient demonstrates impairments in the following area(s):  Balance;Cognition;Endurance;Safety OT  Basic ADL's Functional Problem(s): Grooming;Bathing;Dressing;Toileting;Eating OT Advanced ADL's Functional Problem(s): Simple Meal Preparation OT Transfers Functional Problem(s): Toilet;Tub/Shower OT Additional Impairment(s): None OT Plan OT Intensity: Minimum of 1-2 x/day, 45 to 90 minutes OT Frequency: 5 out of 7 days OT Duration/Estimated Length of Stay: 7-10 days OT Treatment/Interventions: Cognitive remediation/compensation;Discharge planning;Balance/vestibular training;Patient/family education;Self Care/advanced ADL retraining;Therapeutic Exercise;UE/LE Coordination activities;Therapeutic Activities;DME/adaptive equipment instruction;Functional mobility training OT Self Feeding Anticipated Outcome(s): Mod I OT Basic Self-Care Anticipated Outcome(s): Mod I OT Toileting Anticipated Outcome(s): Mod I OT Bathroom Transfers Anticipated Outcome(s): Supervision OT Recommendation Patient destination: Home Follow Up Recommendations: Home health OT Equipment Recommended: To be determined  Skilled Therapeutic Intervention OT initial evaluation completed with treatment provided to address transfers, dynamic sitting/standing balance, attention/awareness, and adapted bathing/dressing skills using DME.  +2 helper present d/t suspected balance deficits at admission to CIR however pt was able to perform bed mobility and transfers safely with only mod assist to lift and maintain standing balance during stand-pivot transfers.   Pt requested to toilet and proceeded to shower with preference of female RN tech assist d/t personal modesty.    Pt demonstrated skillful use of BUE during assessment, minimal balance deficits but significant weakness and fatigue from light activities presented.   Suspect delayed processing of instructions and possible higher level cognitive impairment during social interactions however orientation and memory appear intact.  OT  Evaluation Precautions/Restrictions  Precautionsif Precautions: Fall Precaution Comments: last falh l in her yard Restrictions Weight Bearing Restrictions: No   General Chart Reviewed: Yes Family/Caregiver Present: No   Vital Signs Therapy Vitals Temp: 98.4 F (36.9 C) Temp Source: Oral Pulse Rate: (!) 118 Resp: 18 BP: (!) 160/73 Patient Position (if appropriate): Lying Oxygen Therapy SpO2: 99 % O2 Device: Not Delivered   Pain Pain Assessment Pain Assessment: 0-10 Pain Score: 3  Pain Type: Acute pain Pain Location: Head Pain Orientation: Posterior Pain Descriptors / Indicators: Aching   Home Living/Prior Functioning Home Living Available Help at Discharge: Family, Friend(s) Engineer, manufacturing used as needed) Type of Home: House Home Access: Stairs to enter Technical brewer of Steps: 1 Entrance Stairs-Rails: None Home Layout: One level Bathroom Shower/Tub: Multimedia programmer: Programmer, systems: Yes  Lives With: Alone IADL History Homemaking Responsibilities: Yes Meal Prep Responsibility: Secondary (eats out a lot) Laundry Responsibility: Primary Cleaning Responsibility: Primary (pays for housekeeper) [employer] [place] Paying/Finance Responsibility: Primary Shopping Responsibility: Primary Child Care Responsibility: No Current License: Yes Mode of Transportation: Car Education: Therapist, sports Occupation: Retired Type of Occupation: Marine scientist, 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 [place] Housekeeping: Moderate Yard Work: Total  Able to [employer]?: Yes Driving: Yes Vocation: Retired Comments: family reports pt has had several falls in past year and has had concerns about her safety; pt drove and did her own shopping PTA, son stating she was only supposed to drive  short distances   ADL ADL ADL Comments: see Functional Assessment Tool   Vision/Perception  Vision- History Baseline Vision/History: No visual deficits Perception Comments: WFL   Cognition Overall Cognitive Status: Impaired/Different from baseline Arousal/Alertness: Awake/alert Orientation Level: Person;Place;Situation Person: Oriented Place: Oriented Situation: Oriented Year: 2017 Month: December Day of Week: Correct Memory: Impaired Memory Impairment: Decreased short term memory Decreased Short Term Memory: Verbal complex Immediate Memory Recall: Sock;Blue;Bed Memory Recall: Sock;Blue;Bed Memory Recall Sock: With Cue Memory Recall Blue: Without Cue Memory  Recall Bed: Without Cue Attention: Selective Sustained Attention: Appears intact Selective Attention: Impaired Selective Attention Impairment: Verbal complex Awareness: Impaired Awareness Impairment: Anticipatory impairment Problem Solving: Appears intact Safety/Judgment: Appears intact   Sensation Sensation Light Touch: Appears Intact Stereognosis: Appears Intact Hot/Cold: Appears Intact Proprioception: Appears Intact Coordination Gross Motor Movements are Fluid and Coordinated: Yes Fine Motor Movements are Fluid and Coordinated: Yes   Motor  Motor Motor: Other (comment) Motor - Skilled Clinical Observations: Generalized weakness   Mobility  Bed Mobility Bed Mobility: Rolling Right;Rolling Left;Supine to Sit;Sit to Supine Rolling Right: 5: Supervision Rolling Right Details: Verbal cues for technique;Verbal cues for precautions/safety Rolling Left: 5: Supervision Rolling Left Details: Verbal cues for technique;Verbal cues for precautions/safety Supine to Sit: 5: Supervision Supine to Sit Details: Verbal cues for technique;Verbal cues for precautions/safety Sit to Supine: 5: Supervision Sit to Supine - Details: Verbal cues for technique;Verbal cues for precautions/safety Transfers Sit to Stand: 4:  Min assist Sit to Stand Details: Verbal cues for technique;Verbal cues for precautions/safety;Tactile cues for weight shifting   Trunk/Postural Assessment  Cervical Assessment Cervical Assessment: Within Functional Limits Thoracic Assessment Thoracic Assessment: Within Functional Limits Lumbar Assessment Lumbar Assessment: Within Functional Limits Postural Control Postural Control: Within Functional Limits Righting Reactions: delayed in standing.    Balance Balance Balance Assessed: Yes Static Sitting Balance Static Sitting - Balance Support: No upper extremity supported Static Sitting - Level of Assistance: 5: Stand by assistance Dynamic Sitting Balance Dynamic Sitting - Balance Support: No upper extremity supported Dynamic Sitting - Level of Assistance: 5: Stand by assistance Static Standing Balance Static Standing - Balance Support: No upper extremity supported Static Standing - Level of Assistance: 4: Min assist Dynamic Standing Balance Dynamic Standing - Balance Support: No upper extremity supported Dynamic Standing - Level of Assistance: 3: Mod assist   Extremity/Trunk Assessment RUE Assessment RUE Assessment: Within Functional Limits LUE Assessment LUE Assessment: Within Functional Limits   See Function Navigator for Current Functional Status.   Refer to Care Plan for Long Term Goals  Recommendations for other services: None  Discharge Criteria: Patient will be discharged from OT if patient refuses treatment 3 consecutive times without medical reason, if treatment goals not met, if there is a change in medical status, if patient makes no progress towards goals or if patient is discharged from hospital.  The above assessment, treatment plan, treatment alternatives and goals were discussed and mutually agreed upon: by patient  [facility] 04/27/2016, 7:03 AM

## 2016-04-26 NOTE — Progress Notes (Signed)
Amy [name] is a 82 y.o. female 1929-12-17 [id number]  Subjective: No new complaints. Slept well. Feeling OK.  Objective: Vital signs in last 24 hours: Temp:  [98.4 F (36.9 C)-99 F (37.2 C)] 98.4 F (36.9 C) (12/02 0549) Pulse Rate:  [61-118] 118 (12/02 0549) Resp:  [18] 18 (12/02 0549) BP: (160-162)/(73-76) 160/73 (12/02 0549) SpO2:  [96 %-99 %] 99 % (12/02 0549) Weight:  [189 lb (85.7 kg)] 189 lb (85.7 kg) (12/01 1736) Weight change:  Last BM Date: 05/23/16  Intake/Output from previous day: 12/01 0701 - 12/02 0700 In: 300 [P.O.:300] Out: -  Last cbgs: CBG (last 3)   Recent Labs  04/26/16 0013 04/26/16 0630 04/26/16 1111  GLUCAP 147* 147* 157*     Physical Exam General: No apparent distress   HEENT: not dry Lungs: Normal effort. Lungs clear to auscultation, no crackles or wheezes. Cardiovascular: Regular rate and rhythm, no edema Abdomen: S/NT/ND; BS(+) Musculoskeletal:  unchanged Neurological: No new neurological deficits In a w/c Skin: clear  Aging changes Mental state: Alert, oriented, cooperative    Lab Results: BMET    Component Value Date/Time   NA 137 04/22/2016 1932   K 3.5 04/22/2016 1932   CL 101 04/22/2016 1932   CO2 27 04/22/2016 1932   GLUCOSE 130 (H) 04/22/2016 1932   GLUCOSE 91 04/22/2006 0808   BUN 12 04/22/2016 1932   CREATININE 1.14 (H) 04/25/2016 1931   CALCIUM 9.4 04/22/2016 1932   GFRNONAA 43 (L) 04/25/2016 1931   GFRAA 50 (L) 04/25/2016 1931   CBC    Component Value Date/Time   WBC 7.2 04/25/2016 1931   RBC 3.77 (L) 04/25/2016 1931   HGB 11.3 (L) 04/25/2016 1931   HCT 33.9 (L) 04/25/2016 1931   PLT 190 04/25/2016 1931   MCV 89.9 04/25/2016 1931   MCH 30.0 04/25/2016 1931   MCHC 33.3 04/25/2016 1931   RDW 12.9 04/25/2016 1931   LYMPHSABS 2.1 04/22/2016 2100   MONOABS 0.8 04/22/2016 2100   EOSABS 0.0 04/22/2016 2100   BASOSABS 0.0 04/22/2016 2100    Studies/Results: No results found.  Medications: I have  reviewed the patient's current medications.  Assessment/Plan:   1. CVA - in therapy 2. DVT proph- Lovenox 3. Depression - Zoloft  4. Hypothyroidism: Levothroid 5. Dyslipidemia - Lipitor 6. Seizure proph - Keppra   Length of stay, days: 1  Walker Kehr , MD 04/26/2016, 1:40 PM

## 2016-04-27 ENCOUNTER — Inpatient Hospital Stay (HOSPITAL_COMMUNITY): Payer: Medicare Other | Admitting: Physical Therapy

## 2016-04-27 DIAGNOSIS — E785 Hyperlipidemia, unspecified: Secondary | ICD-10-CM

## 2016-04-27 LAB — GLUCOSE, CAPILLARY
GLUCOSE-CAPILLARY: 142 mg/dL — AB (ref 65–99)
GLUCOSE-CAPILLARY: 121 mg/dL — AB (ref 65–99)
Glucose-Capillary: 192 mg/dL — ABNORMAL HIGH (ref 65–99)
Glucose-Capillary: 141 mg/dL — ABNORMAL HIGH (ref 65–99)

## 2016-04-27 NOTE — Progress Notes (Signed)
Physical Therapy Session Note  Patient Details  Name: Amy [name] MRN: [id number] Date of Birth: 08-30-1929  Today's Date: 04/27/2016 PT Individual Time: 0805-0820 and [id number] PT Individual Time Calculation (min): 15 min and 25 min (total 40 min)    Short Term Goals: Week 1:  PT Short Term Goal 1 (Week 1): STG = LTG due to ELOS  Skilled Therapeutic Interventions/Progress Updates:   Tx 1: Pt received seated on toilet after continent bowel movement; denies pain and agreeable to treatment. Pt performed hygiene and clothing management with S, setup to retrieve sanitary pad. Stand pivot transfer toilet >w/c and w/c >recliner with min guard. After short rest break in recliner, pt reports needing to use restroom again urgently. Gait into bathroom no AD and min guard x10'. Pt again managed clothing to sit on toilet. Pt remained seated on toilet at end of session to complete bowel movement; verbalized understanding of use of call bell for assist when finished.  Tx 2: Pt received seated in recliner, denies pain and agreeable to treatment but requests to remain in room because of her attire. Sit <>stand x10 reps from recliner with UE support on first two reps, remaining reps without UE support and S overall. Standing heel raises, marching, hip abduction 2x15 each exercise with light UE support on RW and repetitive cues for upright posture and glute activation. Rest breaks between each exercise due to fatigue. Remained seated in recliner at end of session, all needs in reach.   Therapy Documentation Precautions:  Precautions Precautions: Fall Precaution Comments: last fall in her yard Restrictions Weight Bearing Restrictions: No  See Function Navigator for Current Functional Status.   Therapy/Group: Individual Therapy  Luberta Mutter 04/27/2016, 8:31 AM

## 2016-04-27 NOTE — Progress Notes (Addendum)
Amy [name] is a 82 y.o. female [birth year] [id number]  Subjective: No new complaints. No new problems. Slept well. Feeling OK.  Objective: Vital signs in last 24 hours: Temp:  [98.2 F (36.8 C)-98.3 F (36.8 C)] 98.3 F (36.8 C) (12/03 1439) Pulse Rate:  [60-65] 65 (12/03 1439) Resp:  [18] 18 (12/03 1439) BP: (143-165)/(69-74) 143/69 (12/03 1439) SpO2:  [95 %-100 %] 95 % (12/03 1439) Weight change:  Last BM Date: 04/23/16  Intake/Output from previous day: 12/02 0701 - 12/03 0700 In: 480 [P.O.:480] Out: -  Last cbgs: CBG (last 3)   Recent Labs  04/26/16 2031 04/27/16 0629 04/27/16 1148  GLUCAP 161* 142* 121*     Physical Exam General: No apparent distress   HEENT: not dry Lungs: Normal effort. Lungs clear to auscultation, no crackles or wheezes. Cardiovascular: Regular rate and rhythm, no edema Abdomen: S/NT/ND; BS(+) Musculoskeletal:  unchanged Neurological: No new neurological deficits. In a w/c Wounds: N/A    Skin: clear  Aging changes Mental state: Alert, oriented, cooperative    Lab Results: BMET    Component Value Date/Time   NA 137 04/22/2016 1932   K 3.5 04/22/2016 1932   CL 101 04/22/2016 1932   CO2 27 04/22/2016 1932   GLUCOSE 130 (H) 04/22/2016 1932   GLUCOSE 91 04/22/2006 0808   BUN 12 04/22/2016 1932   CREATININE 1.14 (H) 04/25/2016 1931   CALCIUM 9.4 04/22/2016 1932   GFRNONAA 43 (L) 04/25/2016 1931   GFRAA 50 (L) 04/25/2016 1931   CBC    Component Value Date/Time   WBC 7.2 04/25/2016 1931   RBC 3.77 (L) 04/25/2016 1931   HGB 11.3 (L) 04/25/2016 1931   HCT 33.9 (L) 04/25/2016 1931   PLT 190 04/25/2016 1931   MCV 89.9 04/25/2016 1931   MCH 30.0 04/25/2016 1931   MCHC 33.3 04/25/2016 1931   RDW 12.9 04/25/2016 1931   LYMPHSABS 2.1 04/22/2016 2100   MONOABS 0.8 04/22/2016 2100   EOSABS 0.0 04/22/2016 2100   BASOSABS 0.0 04/22/2016 2100    Studies/Results: No results found.  Medications: I have reviewed the patient's  current medications.  Assessment/Plan:  1. CVA - cont w/therapies 2. DVT proph w/lovenox 3. Depression - discussed w/pt. On Zoloft 4. Hypothyroidism: cont w/Levothroid 5. Seizures - cont w/Keppra 6. Dyslipidemia. Cont w/Lipitor      Length of stay, days: 2  Walker Kehr , MD 04/27/2016, 4:04 PM

## 2016-04-28 ENCOUNTER — Inpatient Hospital Stay (HOSPITAL_COMMUNITY): Payer: Medicare Other | Admitting: Speech Pathology

## 2016-04-28 ENCOUNTER — Inpatient Hospital Stay (HOSPITAL_COMMUNITY): Payer: Medicare Other

## 2016-04-28 ENCOUNTER — Inpatient Hospital Stay (HOSPITAL_COMMUNITY): Payer: Medicare Other | Admitting: Occupational Therapy

## 2016-04-28 DIAGNOSIS — R21 Rash and other nonspecific skin eruption: Secondary | ICD-10-CM

## 2016-04-28 LAB — COMPREHENSIVE METABOLIC PANEL
ALBUMIN: 3.5 g/dL (ref 3.5–5.0)
ALK PHOS: 58 U/L (ref 38–126)
ALT: 23 U/L (ref 14–54)
ANION GAP: 6 (ref 5–15)
AST: 24 U/L (ref 15–41)
BUN: 18 mg/dL (ref 6–20)
CALCIUM: 8.9 mg/dL (ref 8.9–10.3)
CHLORIDE: 103 mmol/L (ref 101–111)
CO2: 27 mmol/L (ref 22–32)
Creatinine, Ser: 1.09 mg/dL — ABNORMAL HIGH (ref 0.44–1.00)
GFR calc Af Amer: 52 mL/min — ABNORMAL LOW (ref >60)
GFR calc non Af Amer: 45 mL/min — ABNORMAL LOW (ref >60)
GLUCOSE: 146 mg/dL — AB (ref 65–99)
Potassium: 3.7 mmol/L (ref 3.5–5.1)
SODIUM: 136 mmol/L (ref 135–145)
Total Bilirubin: 0.9 mg/dL (ref 0.3–1.2)
Total Protein: 6.2 g/dL — ABNORMAL LOW (ref 6.5–8.1)

## 2016-04-28 LAB — CBC WITH DIFFERENTIAL/PLATELET
BASOS PCT: 0 %
Basophils Absolute: 0 10*3/uL (ref 0.0–0.1)
EOS ABS: 0.1 10*3/uL (ref 0.0–0.7)
Eosinophils Relative: 2 %
HCT: 35.3 % — ABNORMAL LOW (ref 36.0–46.0)
HEMOGLOBIN: 11.5 g/dL — AB (ref 12.0–15.0)
Lymphocytes Relative: 39 %
Lymphs Abs: 2.3 10*3/uL (ref 0.7–4.0)
MCH: 29.6 pg (ref 26.0–34.0)
MCHC: 32.6 g/dL (ref 30.0–36.0)
MCV: 91 fL (ref 78.0–100.0)
Monocytes Absolute: 0.5 10*3/uL (ref 0.1–1.0)
Monocytes Relative: 8 %
NEUTROS PCT: 51 %
Neutro Abs: 3.1 10*3/uL (ref 1.7–7.7)
Platelets: 195 10*3/uL (ref 150–400)
RBC: 3.88 MIL/uL (ref 3.87–5.11)
RDW: 13.5 % (ref 11.5–15.5)
WBC: 6 10*3/uL (ref 4.0–10.5)

## 2016-04-28 LAB — GLUCOSE, CAPILLARY
GLUCOSE-CAPILLARY: 160 mg/dL — AB (ref 65–99)
Glucose-Capillary: 145 mg/dL — ABNORMAL HIGH (ref 65–99)
Glucose-Capillary: 146 mg/dL — ABNORMAL HIGH (ref 65–99)
Glucose-Capillary: 152 mg/dL — ABNORMAL HIGH (ref 65–99)

## 2016-04-28 MED ORDER — CAMPHOR-MENTHOL 0.5-0.5 % EX LOTN
TOPICAL_LOTION | CUTANEOUS | Status: DC | PRN
  Administered 2016-04-28: 11:00:00 via TOPICAL
  Filled 2016-04-28: qty 222

## 2016-04-28 MED ORDER — AMLODIPINE BESYLATE 5 MG PO TABS
5.0000 mg | ORAL_TABLET | Freq: Every day | ORAL | Status: DC
  Administered 2016-04-28 – 2016-05-07 (×10): 5 mg via ORAL
  Filled 2016-04-28 (×10): qty 1

## 2016-04-28 MED ORDER — CLONIDINE HCL 0.1 MG PO TABS: 0.1000 mg | ORAL_TABLET | Freq: Three times a day (TID) | ORAL | Status: DC | PRN

## 2016-04-28 MED ORDER — HYDROCORTISONE 1 % EX CREA
TOPICAL_CREAM | Freq: Two times a day (BID) | CUTANEOUS | Status: DC
  Administered 2016-04-28: 11:00:00 via TOPICAL
  Administered 2016-04-28: 1 via TOPICAL
  Administered 2016-04-29 – 2016-05-02 (×7): via TOPICAL
  Administered 2016-05-02: 1 via TOPICAL
  Administered 2016-05-03 – 2016-05-05 (×5): via TOPICAL
  Administered 2016-05-05: 1 via TOPICAL
  Administered 2016-05-06 – 2016-05-07 (×3): via TOPICAL
  Filled 2016-04-28 (×4): qty 28

## 2016-04-28 MED ORDER — GLIPIZIDE 10 MG PO TABS
5.0000 mg | ORAL_TABLET | Freq: Two times a day (BID) | ORAL | Status: DC
  Administered 2016-04-28 – 2016-05-07 (×18): 5 mg via ORAL
  Filled 2016-04-28 (×19): qty 1

## 2016-04-28 NOTE — Progress Notes (Signed)
Physical Therapy Session Note  Patient Details  Name: Amy [name] MRN: [id number] Date of Birth: 09/20/1929  Today's Date: 04/28/2016 PT Individual Time: [id number] PT Individual Time Calculation (min): 74 min    Short Term Goals: Week 1:  PT Short Term Goal 1 (Week 1): STG = LTG due to ELOS  Skilled Therapeutic Interventions/Progress Updates:    Session focused on gait training without AD to challenge balance with HHA and min assist, neuro re-ed for dynamic standing balance re-training during Wii sports games (tennis and bowling) in normal stance and then in tandem stance to increase challenge with close supervision to min assist, stair negotiation training for community mobility and generalized strengthening with close supervision for cues for attention to foot placement using bilateral rails, administered and discussed results of Berg Balance test (see below for details) related to increased fall risk, and overall endurance/activity tolerance. Pt requires rest breaks but did not have bouts of nausea during this session as she reports in the past. Pt requires extra time for processing and cues for problem solving during functional mobility tasks. Hand off to CSW in room with family present.    Therapy Documentation Precautions:  Precautions Precautions: Fall Precaution Comments: last fall in her yard Restrictions Weight Bearing Restrictions: No  Pain: Pain Assessment Pain Assessment: No/denies pain     Balance: Standardized Balance Assessment Standardized Balance Assessment: Berg Balance Test Berg Balance Test Sit to Stand: Able to stand  independently using hands Standing Unsupported: Able to stand 2 minutes with supervision Sitting with Back Unsupported but Feet Supported on Floor or Stool: Able to sit safely and securely 2 minutes Stand to Sit: Controls descent by using hands Transfers: Needs one person to assist Standing Unsupported with Eyes Closed: Able to stand 10  seconds with supervision Standing Ubsupported with Feet Together: Needs help to attain position but able to stand for 30 seconds with feet together From Standing, Reach Forward with Outstretched Arm: Can reach forward >5 cm safely (2") From Standing Position, Pick up Object from Floor: Unable to try/needs assist to keep balance (able to pick up with min assist) From Standing Position, Turn to Look Behind Over each Shoulder: Turn sideways only but maintains balance Turn 360 Degrees: Needs close supervision or verbal cueing Standing Unsupported, Alternately Place Feet on Step/Stool: Able to complete >2 steps/needs minimal assist Standing Unsupported, One Foot in Front: Loses balance while stepping or standing Standing on One Leg: Unable to try or needs assist to prevent fall Total Score: 24  See Function Navigator for Current Functional Status.   Therapy/Group: Individual Therapy  Canary Brim Ivory Broad, PT, DPT  04/28/2016, 3:36 PM

## 2016-04-28 NOTE — Progress Notes (Signed)
Patient information reviewed and entered into eRehab system by Kayceon Oki, RN, CRRN, PPS Coordinator.  Information including medical coding and functional independence measure will be reviewed and updated through discharge.     Per nursing patient was given "Data Collection Information Summary for Patients in Inpatient Rehabilitation Facilities with attached "Privacy Act Statement-Health Care Records" upon admission.  

## 2016-04-28 NOTE — Care Management Note (Signed)
Inpatient [facility] Individual Statement of Services  Patient Name:  Amy [name]  Date:  04/28/2016  Welcome to the [facility].  Our goal is to provide you with an individualized program based on your diagnosis and situation, designed to meet your specific needs.  With this comprehensive rehabilitation program, you will be expected to participate in at least 3 hours of rehabilitation therapies Monday-Friday, with modified therapy programming on the weekends.  Your rehabilitation program will include the following services:  Physical Therapy (PT), Occupational Therapy (OT), Speech Therapy (ST), 24 hour per day rehabilitation nursing, Therapeutic Recreaction (TR), Case Management (Social Worker), Rehabilitation Medicine, Nutrition Services and Pharmacy Services  Weekly team conferences will be held on Wednesday to discuss your progress.  Your Social Worker will talk with you frequently to get your input and to update you on team discussions.  Team conferences with you and your family in attendance may also be held.  Expected length of stay: 7-10 days  Overall anticipated outcome: supervision-mod/i level  Depending on your progress and recovery, your program may change. Your Social Worker will coordinate services and will keep you informed of any changes. Your Social Worker's name and contact numbers are listed  below.  The following services may also be recommended but are not provided by the [facility] will be made to provide these services after discharge if needed.  Arrangements include referral to agencies that provide these services.  Your insurance has been verified to be:  [employer] Your primary doctor is:  [name]  Pertinent information will be shared with your doctor and your insurance company.  Social  Worker:  Ovidio Kin, [place] or ([phone]  Information discussed with and copy given to patient by: Elease Hashimoto, 04/28/2016, 11:37 AM

## 2016-04-28 NOTE — Progress Notes (Signed)
Speech Language Pathology Daily Session Note  Patient Details  Name: Amy [name] MRN: [id number] Date of Birth: 03-08-1930  Today's Date: 04/28/2016 SLP Individual Time: 0903-1000 SLP Individual Time Calculation (min): 57 min   Short Term Goals: Week 1: SLP Short Term Goal 1 (Week 1): Pt will demonstrate sustained attention to functional tasks for ~5 minutes with Mod A verbal cues for redirection.  SLP Short Term Goal 2 (Week 1): Pt will demonstrate intellectual awareness by stating 1 cognitive deficits related to current CVA with Mod A verbal cues.  SLP Short Term Goal 3 (Week 1): Pt will demonstrate functional problem solving for mildly complex tasks with Mod A verbal cues.  SLP Short Term Goal 4 (Week 1): Pt will recall and follow 3 step directions with 75% accuracy and Mod A verbal cues.   Skilled Therapeutic Interventions:  Pt was seen for skilled ST targeting cognitive goals.  SLP facilitated the session with money management tasks to address goals for functional problem solving.  Pt was 70% independent for counting money and making change which improved to 100% accuracy with min assist verbal cues for working memory/alternating attention.  Pt was able to complete functional math calculations from word problems with supervision verbal cues and use of paper and pencil to compensate for working memory deficits.  Pt was returned to room and left in wheelchair with call bell within reach.  Continue per current plan of care.    Function:  Eating Eating               Cognition Comprehension Comprehension assist level: Follows basic conversation/direction with no assist  Expression   Expression assist level: Expresses basic needs/ideas: With no assist  Social Interaction Social Interaction assist level: Interacts appropriately with others with medication or extra time (anti-anxiety, antidepressant).  Problem Solving Problem solving assist level: Solves basic 75 - 89% of the  time/requires cueing 10 - 24% of the time  Memory Memory assist level: Recognizes or recalls 50 - 74% of the time/requires cueing 25 - 49% of the time    Pain Pain Assessment Pain Assessment: No/denies pain  Therapy/Group: Individual Therapy  Ritvik Mczeal, Selinda Orion 04/28/2016, 12:30 PM

## 2016-04-28 NOTE — IPOC Note (Signed)
Overall Plan of Care [facility]) Patient Details Name: Amy [name] MRN: [id number] DOB: 09/13/1929  Admitting Diagnosis: CVA  Hospital Problems: Principal Problem:   Subcortical infarction [facility]) Active Problems:   Essential hypertension   Left-sided neglect     Functional Problem List: Nursing Behavior, Bladder, Bowel, Edema, Medication Management, Nutrition, Pain, Perception, Safety, Sensory, Motor, Skin Integrity, Endurance  PT Balance, Behavior, Endurance, Safety  OT Balance, Cognition, Endurance, Safety  SLP Cognition, Perception, Safety  TR         Basic ADL's: OT Grooming, Bathing, Dressing, Toileting, Eating     Advanced  ADL's: OT Simple Meal Preparation     Transfers: PT Bed Mobility, Bed to Chair, Car, Furniture, Floor  OT Toilet, Metallurgist: PT Ambulation, Emergency planning/management officer, Stairs     Additional Impairments: OT None  SLP Social Cognition   Social Interaction, Problem Solving, Memory, Attention, Awareness  TR      Anticipated Outcomes Item Anticipated Outcome  Self Feeding Mod I  Swallowing      Basic self-care  Mod I  Toileting  Mod I   Bathroom Transfers Supervision  Bowel/Bladder  min assist   Transfers  Supervision assist with LRAD  Locomotion  Supervision assist With LRAD   Communication     Cognition  Mod A  Pain  less<3  Safety/Judgment  min assist.   Therapy Plan: PT Intensity: Minimum of 1-2 x/day ,45 to 90 minutes PT Frequency: 5 out of 7 days PT Duration Estimated Length of Stay: 7-10 days  OT Intensity: Minimum of 1-2 x/day, 45 to 90 minutes OT Frequency: 5 out of 7 days OT Duration/Estimated Length of Stay: 7-10 days SLP Intensity: Minumum of 1-2 x/day, 30 to 90 minutes SLP Frequency: 3 to 5 out of 7 days       Team Interventions: Nursing Interventions Patient/Family Education, Bladder Management, Bowel Management, Disease Management/Prevention, Pain Management, Medication Management, Skin  Care/Wound Management, Cognitive Remediation/Compensation, Discharge Planning, Psychosocial Support  PT interventions Ambulation/gait training, Cognitive remediation/compensation, Community reintegration, Discharge planning, Disease management/prevention, Training and development officer, DME/adaptive equipment instruction, Functional mobility training, Neuromuscular re-education, Patient/family education, Psychosocial support, Skin care/wound management, Pain management, Therapeutic Activities, Splinting/orthotics, Therapeutic Exercise, Stair training, UE/LE Strength taining/ROM, UE/LE Coordination activities, Visual/perceptual remediation/compensation, Wheelchair propulsion/positioning  OT Interventions Cognitive remediation/compensation, Discharge planning, Training and development officer, Patient/family education, Self Care/advanced ADL retraining, Therapeutic Exercise, UE/LE Coordination activities, Therapeutic Activities, DME/adaptive equipment instruction, Functional mobility training  SLP Interventions Functional tasks, Patient/family education, Cognitive remediation/compensation, Cueing hierarchy, Therapeutic Activities, Internal/external aids  TR Interventions    SW/CM Interventions Discharge Planning, Psychosocial Support, Patient/Family Education    Team Discharge Planning: Destination: PT-Home ,OT- Home , SLP-Assisted Living Projected Follow-up: PT-Home health PT, OT-  Home health OT, SLP-24 hour supervision/assistance Projected Equipment Needs: PT-Rolling walker with 5" wheels, OT- To be determined, SLP-None recommended by SLP Equipment Details: PT- , OT-  Patient/family involved in discharge planning: PT- Patient,  OT-Patient, SLP-Patient, Family member/caregiver  MD ELOS: 7-10 days Medical Rehab Prognosis:  Excellent Assessment: The patient has been admitted for CIR therapies with the diagnosis of small right CVA. The team will be addressing functional mobility, strength, stamina, balance,  safety, adaptive techniques and equipment, self-care, bowel and bladder mgt, patient and caregiver education, left sided attention/awareness, vision, cognitive perceptual awareness, communication, ego support, community reintegration. Goals have been set at min assist for mobility,min assist to supervision with self-care, and mod assist with cognition.    Meredith Staggers, MD, Mellody Drown  See Team Conference Notes for weekly updates to the plan of care

## 2016-04-28 NOTE — Progress Notes (Signed)
Amy [name] PHYSICAL MEDICINE & REHABILITATION     PROGRESS NOTE    Subjective/Complaints: Complains of rash on back. Can't stop the itching. Received a "fungal cream"  ROS: pt denies nausea, vomiting, diarrhea, cough, shortness of breath or chest pain   Objective: Vital Signs: Blood pressure (!) 141/70, pulse 70, temperature 98.1 F (36.7 C), temperature source Oral, resp. rate 17, height 5\' 7"  (1.702 m), weight 85.7 kg (189 lb), SpO2 96 %. No results found.  Recent Labs  04/25/16 1931 04/28/16 0515  WBC 7.2 6.0  HGB 11.3* 11.5*  HCT 33.9* 35.3*  PLT 190 195    Recent Labs  04/25/16 1931 04/28/16 0515  NA  --  136  K  --  3.7  CL  --  103  GLUCOSE  --  146*  BUN  --  18  CREATININE 1.14* 1.09*  CALCIUM  --  8.9   CBG (last 3)   Recent Labs  04/27/16 1622 04/27/16 2126 04/28/16 0635  GLUCAP 192* 141* 145*    Wt Readings from Last 3 Encounters:  04/25/16 85.7 kg (189 lb)  04/22/16 74.8 kg (165 lb)  12/19/15 86.2 kg (190 lb)    Physical Exam:  Constitutional: She appears well-developedand well-nourished.  HENT:  Head: Normocephalic.  Right Ear: External earnormal.  Left Ear: External earnormal.  Eyes: EOMare normal. Pupils are equal, round, and reactive to light.  Pupils round and reactive to light Neck: Normal range of motion. Neck supple. No tracheal deviationpresent. No thyromegalypresent.  Cardiovascular: RRR.  Respiratory: clear without wheezes.  GI: Soft. Bowel sounds are normal. She exhibits no distension. There is no tenderness. There is no rebound.  Musculoskeletal: She exhibits no edema.  Neurological: She is alert.  Alert. Oriented to self, place, month.  Mild left inattention. Sees to all 4 quadrants but less attentive to lower left.. Remains somewhatApraxic. Seems to sense pain in all 4. Motor 4/5 to 5/5 in all 4 limbs proximal to distal. .senses pain in all 4 limbs. DTR's are 1+.  Skin: maculopapular rash over back.   Psychiatric: pleasnt and cooperative  Assessment/Plan: 1. 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 Physiatrist is providing close team supervision and 24 hour management of active medical problems listed below. Physiatrist and rehab team continue to assess barriers to discharge/monitor patient progress toward functional and medical goals.  Function:  Bathing Bathing position   Position: Shower  Bathing parts Body parts bathed by patient: Right arm, Left arm, Chest, Abdomen, Front perineal area, Right upper leg, Left upper leg Body parts bathed by helper: Right lower leg, Left lower leg, Back, Buttocks  Bathing assist Assist Level: Touching or steadying assistance(Pt > 75%)      Upper Body Dressing/Undressing Upper body dressing   What is the patient wearing?: Bra, Pull over shirt/dress Bra - Perfomed by patient: Thread/unthread right bra strap, Thread/unthread left bra strap Bra - Perfomed by helper: Hook/unhook bra (pull down sports bra) Pull over shirt/dress - Perfomed by patient: Thread/unthread right sleeve, Thread/unthread left sleeve Pull over shirt/dress - Perfomed by helper: Put head through opening, Pull shirt over trunk        Upper body assist Assist Level: Touching or steadying assistance(Pt > 75%)      Lower Body Dressing/Undressing Lower body dressing   What is the patient wearing?: Underwear, Pants, Non-skid slipper socks   Underwear -  Performed by helper: Thread/unthread right underwear leg, Thread/unthread left underwear leg, Pull underwear up/down Pants- Performed by patient: Thread/unthread right pants leg, Thread/unthread left pants leg Pants- Performed by helper: Pull pants up/down, Fasten/unfasten pants   Non-skid slipper socks- Performed by helper: Don/doff right sock, Don/doff left sock                   Lower body assist Assist for lower body dressing:  (moderate assist)      Toileting Toileting   Toileting steps completed by patient: Adjust clothing prior to toileting, Performs perineal hygiene, Adjust clothing after toileting Toileting steps completed by helper: Adjust clothing after toileting Toileting Assistive Devices: Grab bar or rail  Toileting assist Assist level: Supervision or verbal cues   Transfers Chair/bed transfer   Chair/bed transfer method: Stand pivot Chair/bed transfer assist level: Touching or steadying assistance (Pt > 75%) Chair/bed transfer assistive device: Armrests     Locomotion Ambulation     Max distance: 10 Assist level: Touching or steadying assistance (Pt > 75%)   Wheelchair   Type: Manual Max wheelchair distance: 139ft  Assist Level: Supervision or verbal cues  Cognition Comprehension Comprehension assist level: Understands basic 90% of the time/cues < 10% of the time  Expression Expression assist level: Expresses complex 90% of the time/cues < 10% of the time  Social Interaction Social Interaction assist level: Interacts appropriately with others - No medications needed.  Problem Solving Problem solving assist level: Solves basic 50 - 74% of the time/requires cueing 25 - 49% of the time  Memory Memory assist level: Recognizes or recalls 75 - 89% of the time/requires cueing 10 - 24% of the time   Medical Problem List and Plan: 1. Decreased functional mobility with balance deficits processing deficits as well as apraxiasecondary to small right cortical brain infarct/?seizure -continue therapies 2. DVT Prophylaxis/Anticoagulation: Subcutaneous Lovenox. Monitor platelet counts and any signs of bleeding 3. Pain Management: Tylenol as needed 4. Mood: Zoloft 100mg  daily 5. Neuropsych: This patient iscapable of making decisions on herown behalf. 6. Skin/Wound Care: contact rash to back  -steroid cream/sarna/double  sheets 7. Fluids/Electrolytes/Nutrition: I personally reviewed the patient's labs today.   -all near normal 8.Seizure prophylaxis. Keppra-XR 500 mg daily 9..Diabetes mellitus with peripheral neuropathy. Hemoglobin A1c 7.1. Patient on Glucotrol 10 mg twice a day, Glucophage 1000 mg twice a day prior to admission.   -resume glucotrol at 5mg  bid 11.Hypertension. No current antihypertensive medication. Patient on Norvasc 5 mg daily prior to admission.   -controlled at present 12.Hypothyroidism. Synthroid 13.Hyperlipidemia. Lipitor   LOS (Days) 3 A FACE TO FACE EVALUATION WAS PERFORMED  Sulamita Lafountain T 04/28/2016 9:19 AM

## 2016-04-28 NOTE — Progress Notes (Addendum)
Occupational Therapy Session Note  Patient Details  Name: MARJARIE MCFARLAIN MRN: [id number] Date of Birth: January 03, 1930  Today's Date: 04/28/2016 OT Individual Time: 1000-1100 OT Individual Time Calculation (min): 60 min      Skilled Therapeutic Interventions/Progress Updates:   patient required extra time to orient andinitiate self dressing.    She required CGA for dynamic standing balance (during pulling up pants and pericleansing after toileting).   Overall for self care, she required extra time - worked at slow pace and [place].  Patient was left seated in w/c with call bell and phone nearby  Therapy Documentation Precautions:  Precautions Precautions: Fall Precaution Comments: last fall in her yard Restrictions Weight Bearing Restrictions: No  Pain:denied but "My back is itching"  RN back in ointments for itching   Other Treatments:    See Function Navigator for Current Functional Status.   Therapy/Group: Individual Therapy  Alfredia Ferguson [facility] 04/28/2016, 10:42 AM

## 2016-04-29 ENCOUNTER — Inpatient Hospital Stay (HOSPITAL_COMMUNITY): Payer: Medicare Other | Admitting: Physical Therapy

## 2016-04-29 ENCOUNTER — Inpatient Hospital Stay (HOSPITAL_COMMUNITY): Payer: Medicare Other | Admitting: Speech Pathology

## 2016-04-29 ENCOUNTER — Inpatient Hospital Stay (HOSPITAL_COMMUNITY): Payer: Medicare Other | Admitting: Occupational Therapy

## 2016-04-29 LAB — ECHOCARDIOGRAM COMPLETE
Height: 66 in
WEIGHTICAEL: 2640 [oz_av]

## 2016-04-29 LAB — GLUCOSE, CAPILLARY
GLUCOSE-CAPILLARY: 112 mg/dL — AB (ref 65–99)
GLUCOSE-CAPILLARY: 135 mg/dL — AB (ref 65–99)
GLUCOSE-CAPILLARY: 157 mg/dL — AB (ref 65–99)
Glucose-Capillary: 85 mg/dL (ref 65–99)

## 2016-04-29 NOTE — Progress Notes (Signed)
Social Work Assessment and Plan Social Work Assessment and Plan  Patient Details  Name: Amy [name] MRN: [id number] Date of Birth: [birth year]  Today's Date: 04/29/2016  Problem List:  Patient Active Problem List   Diagnosis Date Noted  . Subcortical infarction ([place]) 04/25/2016  . 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 Cerebral thrombosis with cerebral infarction 04/23/2016  . Left homonymous hemianopsia 04/22/2016  . Dizziness and giddiness 07/27/2015  . Balance disorder 07/27/2015  . Depression 03/12/2015  . Abdominal pain, generalized 01/30/2015  . LUQ abdominal pain 08/19/2014  . Personal history of colonic polyps 06/18/2011  . NEOPLASM, SKIN, UNCERTAIN BEHAVIOR [id number]  . Diabetes mellitus without complication ([place]) [id number]  . HYPOTHYROIDISM 04/13/2007  . Essential hypertension 04/13/2007   Past Medical History:  Past Medical History:  Diagnosis Date  . ABSCESS 12/18/2009  . DIABETES MELLITUS, TYPE II 05/22/2009  . Diverticulosis   . Hemorrhoids   . HYPERGLYCEMIA 05/22/2009  . HYPERTENSION 04/13/2007  . HYPOTHYROIDISM 04/13/2007  . NEOPLASM, SKIN, UNCERTAIN BEHAVIOR [id number]  . Tubular adenoma of colon 02/2001   Past Surgical History:  Past Surgical History:  Procedure Laterality Date  . ABDOMINAL HYSTERECTOMY    . APPENDECTOMY    . CHOLECYSTECTOMY    . COLONOSCOPY  04-05-04   per Dr. Deatra Ina, no polyps   . OOPHORECTOMY    . THYROIDECTOMY     secondary to nodule, no cancer  . TONSILLECTOMY AND ADENOIDECTOMY     Social History:  reports that she has never smoked. She has never used smokeless tobacco. She reports that she does not drink alcohol or use drugs.  Family / Support Systems Marital Status: Widow/Widower How Long?: 2 years ago Patient Roles: Parent, Volunteer Children: Leda Gauze Taylor-daughter [phone]-cell  Santiago Glad Bodenheimer-daughter [phone]-cell Other Supports: Church members and friends Anticipated Caregiver: Going to a facility-ALF versus  Independent facility Ability/Limitations of Caregiver: Leda Gauze works and karen has a neurological issue Caregiver Availability: Intermittent Family Dynamics: Close with her three children two daughter's are local, son is in [place]. She has numerous church members and friends who visit and check in on her at home. She would like to be as independent as possible before leaving here.  Social History Preferred language: English Religion: Protestant Cultural Background: No issues Education: Secretary/administrator Read: Yes Write: Yes Employment Status: Retired Freight forwarder Issues: No issues Guardian/Conservator: MD feels pt is capable of making her own decisions while here. Her daughter Leda Gauze is her POA and has been involved in decisions while here in hospital   Abuse/Neglect Physical Abuse: Denies Verbal Abuse: Denies Sexual Abuse: Denies Exploitation of patient/patient's resources: Denies Self-Neglect: Denies  Emotional Status Pt's affect, behavior adn adjustment status: Pt is motivated to do well and has always taken care of herself and been independent. She is still processing the fact she has to quit driving and will be moving to an independent living facility when leaves here. She is trying to take in all in stride. Recent Psychosocial Issues: other health issues has had a slow decline in the past two months Pyschiatric History: History of depression since husband's death she has been on zoloft and feels it is helping her. She is able to verbalize her feelings and losses. Probably would beneift from seeing neuro-psych while here for support. Substance Abuse History: No issues  Patient / Family Perceptions, Expectations & Goals Pt/Family understanding of illness & functional limitations: Pt and daughter's can explain her condition and are still seeking information as to what really occured. They have been  told no signs of a stroke but has the symptoms of one and thinking pt had a  seizure. They are wanting the MD to talk with them. Premorbid pt/family roles/activities: Mother, grandmother, recent great grandmother, retiree, church member, etc Anticipated changes in roles/activities/participation: resume Pt/family expectations/goals: Pt states: " I want to take care of myself I always have."  Leda Gauze states: " We want here in the best place and where she can be most independent."  [employer]: None Premorbid Home Care/DME Agencies: None Transportation available at discharge: Family now pt was driving PTA Resource referrals recommended: Neuropsychology, Support group (specify)  Discharge Planning Living Arrangements: Alone Support Systems: Children, Water engineer, Social worker community Type of Residence: Private residence Insurance underwriter Resources: Multimedia programmer (specify) ([place] Medicare) Financial Resources: [place] Referred: No Living Expenses: Own Money Management: Patient Does the patient have any problems obtaining your medications?: No Home Management: Chartered certified accountant once weekly Patient/Family Preliminary Plans: Plans to go to an ALF versus independent living facility where she can be supervised and still have good quality of life. Aware team conf Wed will discuss level most appropriate for pt at discharge. Social Work Anticipated Follow Up Needs: ALF/[place], Support Group  Clinical Impression Pleasant female who is willing to be flexible and do what is the best for her although it may not be exactly what she wants. She is still processing the fact she has lost her driving priviledges and will not be going back to her home upon Discharge from here. Her daughter's are very supportive and including her and allowing her to make the decisions as much as she can for her plan. May benefit from seeing neuro-psych while here with her history of depression this will be  Much change in a short period of time for her. Will  work on the most appropriate discharge level hopefully can go to a  Independent living level at discharge.  Elease Hashimoto 04/29/2016, 8:45 AM

## 2016-04-29 NOTE — Progress Notes (Signed)
Speech Language Pathology Daily Session Note  Patient Details  Name: Amy [name] MRN: [id number] Date of Birth: 07-17-1929  Today's Date: 04/29/2016 SLP Individual Time: [id number] SLP Individual Time Calculation (min): 55 min   Short Term Goals: Week 1: SLP Short Term Goal 1 (Week 1): Pt will demonstrate sustained attention to functional tasks for ~5 minutes with Mod A verbal cues for redirection.  SLP Short Term Goal 2 (Week 1): Pt will demonstrate intellectual awareness by stating 1 cognitive deficits related to current CVA with Mod A verbal cues.  SLP Short Term Goal 3 (Week 1): Pt will demonstrate functional problem solving for mildly complex tasks with Mod A verbal cues.  SLP Short Term Goal 4 (Week 1): Pt will recall and follow 3 step directions with 75% accuracy and Mod A verbal cues.   Skilled Therapeutic Interventions: Pt was seen for skilled ST targeting cognitive goals.  Therapist administered remaining portions of money management subtests of the ALFA to address goals for problem solving.  Pt needed x2 min assist verbal cues for organization to complete functional math calculations from word problems but was otherwise supervision.  Pt needed min assist verbal cues to recognize and correct errors when writing a check and balancing a check book.  Pt was 100% accurate with distant supervision cues for medication management subtests of assessment.  Pt was left in recliner at the end of today's therapy session with call bell within reach.  Continue per current plan of care.       Function:  Eating Eating   Modified Consistency Diet: No Eating Assist Level: No help, No cues           Cognition Comprehension Comprehension assist level: Follows basic conversation/direction with extra time/assistive device  Expression   Expression assist level: Expresses basic needs/ideas: With extra time/assistive device  Social Interaction Social Interaction assist level: Interacts  appropriately with others with medication or extra time (anti-anxiety, antidepressant).  Problem Solving Problem solving assist level: Solves basic 75 - 89% of the time/requires cueing 10 - 24% of the time  Memory Memory assist level: Recognizes or recalls 75 - 89% of the time/requires cueing 10 - 24% of the time    Pain Pain Assessment Pain Assessment: No/denies pain  Therapy/Group: Individual Therapy  Aliayah Tyer, Selinda Orion 04/29/2016, 11:58 AM

## 2016-04-29 NOTE — Progress Notes (Signed)
Occupational Therapy Session Note  Patient Details  Name: Amy [name] MRN: [id number] Date of Birth: 02/07/1930  Today's Date: 04/29/2016 OT Individual Time: [id number] OT Individual Time Calculation (min): 75 min     Short Term Goals:No short term goals set  Skilled Therapeutic Interventions/Progress Updates:    Pt seen for ADL retraining with a focus on processing, safety awareness, dynamic balance. Pt worked on smooth sit to stands pushing up from armrests vs. Reaching for the walker. She ambulated to toilet and then to shower with steadying A.  Once in shower, she stood 75% of the time using one hand on bar for support.  Pt dressed from recliner with S only, A with TED hose.  She stood at sink for grooming with close S.  Used RW to ambulate to gym. Cues to stay in frame of RW as she was pushing it too far in front and leaning forward. Once in gym, pt felt slightly nauseated. Provided pt gingerale and after a rest she worked on sit to stand without UE support.  Ambulated back to room stating her legs felt very tired. Once in room, resting in recliner but continuing with some nausea. RN informed. Pt with call light in reach.   Therapy Documentation Precautions:  Precautions Precautions: Fall Precaution Comments: last fall in her yard Restrictions Weight Bearing Restrictions: No      Pain: Pain Assessment Pain Assessment: No/denies pain ADL: ADL ADL Comments: see Functional Assessment Tool  See Function Navigator for Current Functional Status.   Therapy/Group: Individual Therapy  [place] 04/29/2016, 12:25 PM

## 2016-04-29 NOTE — Progress Notes (Signed)
[place] PHYSICAL MEDICINE & REHABILITATION     PROGRESS NOTE    Subjective/Complaints: Slept ok, concerned about CBG, Constipation but improved with prune juice a couple days ago but no BM since  ROS: pt denies nausea, vomiting, diarrhea, cough, shortness of breath or chest pain   Objective: Vital Signs: Blood pressure 131/66, pulse 77, temperature 97.8 F (36.6 C), temperature source Oral, resp. rate 18, height 5\' 7"  (1.702 m), weight 85.7 kg (189 lb), SpO2 98 %. No results found.  Recent Labs  04/28/16 0515  WBC 6.0  HGB 11.5*  HCT 35.3*  PLT 195    Recent Labs  04/28/16 0515  NA 136  K 3.7  CL 103  GLUCOSE 146*  BUN 18  CREATININE 1.09*  CALCIUM 8.9   CBG (last 3)   Recent Labs  04/28/16 1651 04/28/16 2047 04/29/16 0635  GLUCAP 160* 152* 112*    Wt Readings from Last 3 Encounters:  04/25/16 85.7 kg (189 lb)  04/22/16 74.8 kg (165 lb)  12/19/15 86.2 kg (190 lb)    Physical Exam:  Constitutional: She appears well-developedand well-nourished.  HENT:  Head: Normocephalic.  Right Ear: External earnormal.  Left Ear: External earnormal.  Eyes: EOMare normal. Pupils are equal, round, and reactive to light.  Pupils round and reactive to light Neck: Normal range of motion. Neck supple. No tracheal deviationpresent. No thyromegalypresent.  Cardiovascular: RRR.  Respiratory: clear without wheezes.  GI: Soft. Bowel sounds are normal. She exhibits no distension. There is no tenderness. There is no rebound.  Musculoskeletal: She exhibits no edema.  Neurological: She is alert.  Alert. Oriented to self, place, month.  Mild left inattention. Sees to all 4 quadrants but less attentive to lower left.. Remains somewhatApraxic. Seems to sense pain in all 4. Motor 4/5 to 5/5 in all 4 limbs proximal to distal. .senses pain in all 4 limbs. DTR's are 1+.  Skin: maculopapular rash over back.  Psychiatric: pleasnt and cooperative  Assessment/Plan: 1.  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 Physiatrist is providing close team supervision and 24 hour management of active medical problems listed below. Physiatrist and rehab team continue to assess barriers to discharge/monitor patient progress toward functional and medical goals.  Function:  Bathing Bathing position   Position: Shower  Bathing parts Body parts bathed by patient: Right arm, Left arm, Chest, Abdomen, Front perineal area, Right upper leg, Left upper leg Body parts bathed by helper: Right lower leg, Left lower leg, Back, Buttocks  Bathing assist Assist Level: Touching or steadying assistance(Pt > 75%)      Upper Body Dressing/Undressing Upper body dressing   What is the patient wearing?: Bra, Pull over shirt/dress Bra - Perfomed by patient: Thread/unthread right bra strap, Thread/unthread left bra strap Bra - Perfomed by helper: Hook/unhook bra (pull down sports bra) Pull over shirt/dress - Perfomed by patient: Thread/unthread right sleeve, Thread/unthread left sleeve Pull over shirt/dress - Perfomed by helper: Put head through opening, Pull shirt over trunk        Upper body assist Assist Level: Touching or steadying assistance(Pt > 75%)      Lower Body Dressing/Undressing Lower body dressing   What is the patient wearing?: Underwear, Pants, Non-skid slipper socks   Underwear - Performed by helper: Thread/unthread right underwear leg, Thread/unthread left underwear leg, Pull underwear up/down Pants- Performed by patient: Thread/unthread right pants  leg, Thread/unthread left pants leg Pants- Performed by helper: Pull pants up/down, Fasten/unfasten pants   Non-skid slipper socks- Performed by helper: Don/doff right sock, Don/doff left sock                  Lower body assist Assist for lower body  dressing:  (moderate assist)      Toileting Toileting   Toileting steps completed by patient: Adjust clothing prior to toileting, Performs perineal hygiene, Adjust clothing after toileting Toileting steps completed by helper: Adjust clothing after toileting Toileting Assistive Devices: Grab bar or rail  Toileting assist Assist level: Supervision or verbal cues   Transfers Chair/bed transfer   Chair/bed transfer method: Ambulatory Chair/bed transfer assist level: Touching or steadying assistance (Pt > 75%) Chair/bed transfer assistive device: Armrests     Locomotion Ambulation     Max distance: 130' Assist level: Touching or steadying assistance (Pt > 75%)   Wheelchair   Type: Manual Max wheelchair distance: 173ft  Assist Level: Supervision or verbal cues  Cognition Comprehension Comprehension assist level: Understands basic 90% of the time/cues < 10% of the time  Expression Expression assist level: Expresses complex 90% of the time/cues < 10% of the time  Social Interaction Social Interaction assist level: Interacts appropriately with others - No medications needed.  Problem Solving Problem solving assist level: Solves basic 50 - 74% of the time/requires cueing 25 - 49% of the time  Memory Memory assist level: Recognizes or recalls 75 - 89% of the time/requires cueing 10 - 24% of the time   Medical Problem List and Plan: 1. Decreased functional mobility with balance deficits processing deficits as well as apraxiasecondary to small right cortical brain infarct/?seizure -Team conf in am 2. DVT Prophylaxis/Anticoagulation: Subcutaneous Lovenox. Monitor platelet counts and any signs of bleeding 3. Pain Management: Tylenol as needed 4. Mood: Zoloft 100mg  daily 5. Neuropsych: This patient iscapable of making decisions on herown behalf. 6. Skin/Wound Care: contact rash to back  -steroid cream/sarna/double sheets 7. Fluids/Electrolytes/Nutrition: I personally  reviewed the patient's labs today.   -all near normal 8.Seizure prophylaxis. Keppra-XR 500 mg daily 9..Diabetes mellitus with peripheral neuropathy. Hemoglobin A1c 7.1. Patient on Glucotrol 10 mg twice a day, Glucophage 1000 mg twice a day prior to admission.   -resume glucotrol at 5mg  bid- CBG improving CBG (last 3)   Recent Labs  04/28/16 1651 04/28/16 2047 04/29/16 0635  GLUCAP 160* 152* 112*    11.Hypertension. No current antihypertensive medication. Patient on Norvasc 5 mg daily prior to admission.   -restarted 12/4 monitor Vitals:   04/28/16 1848 04/29/16 0516  BP: (!) 115/92 131/66  Pulse: 73 77  Resp:  18  Temp:  97.8 F (36.6 C)   12.Hypothyroidism. Synthroid 13.Hyperlipidemia. Lipitor   LOS (Days) 4 A FACE TO FACE EVALUATION WAS PERFORMED  Makayla Confer E 04/29/2016 7:06 AM

## 2016-04-29 NOTE — Progress Notes (Signed)
Physical Therapy Note  Patient Details  Name: Amy [name] MRN: [id number] Date of Birth: 05-04-1930 Today's Date: 04/29/2016    Time: 1330  Pt sleeping upon PT arrival. Pt asks PT to come back in 30 minutes.  30 minutes later pt states she is still too fatigued and not willing to participate despite PT encouragement.   Abrahim Sargent 04/29/2016, 2:31 PM

## 2016-04-30 ENCOUNTER — Inpatient Hospital Stay (HOSPITAL_COMMUNITY): Payer: Medicare Other | Admitting: Occupational Therapy

## 2016-04-30 ENCOUNTER — Inpatient Hospital Stay (HOSPITAL_COMMUNITY): Payer: Medicare Other

## 2016-04-30 ENCOUNTER — Inpatient Hospital Stay (HOSPITAL_COMMUNITY): Payer: Medicare Other | Admitting: Speech Pathology

## 2016-04-30 LAB — GLUCOSE, CAPILLARY
GLUCOSE-CAPILLARY: 92 mg/dL (ref 65–99)
Glucose-Capillary: 139 mg/dL — ABNORMAL HIGH (ref 65–99)
Glucose-Capillary: 153 mg/dL — ABNORMAL HIGH (ref 65–99)
Glucose-Capillary: 133 mg/dL — ABNORMAL HIGH (ref 65–99)

## 2016-04-30 LAB — URINALYSIS, ROUTINE W REFLEX MICROSCOPIC
BILIRUBIN URINE: NEGATIVE
Glucose, UA: NEGATIVE mg/dL
HGB URINE DIPSTICK: NEGATIVE
Ketones, ur: NEGATIVE mg/dL
Leukocytes, UA: NEGATIVE
Nitrite: NEGATIVE
PH: 5 (ref 5.0–8.0)
Protein, ur: NEGATIVE mg/dL
SPECIFIC GRAVITY, URINE: 1.019 (ref 1.005–1.030)

## 2016-04-30 MED ORDER — SERTRALINE HCL 50 MG PO TABS
50.0000 mg | ORAL_TABLET | Freq: Every day | ORAL | Status: DC
  Administered 2016-05-01 – 2016-05-07 (×7): 50 mg via ORAL
  Filled 2016-04-30 (×7): qty 1

## 2016-04-30 MED ORDER — METFORMIN HCL 500 MG PO TABS
500.0000 mg | ORAL_TABLET | Freq: Every day | ORAL | Status: DC
  Administered 2016-04-30 – 2016-05-07 (×8): 500 mg via ORAL
  Filled 2016-04-30 (×8): qty 1

## 2016-04-30 NOTE — Progress Notes (Signed)
[place] PHYSICAL MEDICINE & REHABILITATION     PROGRESS NOTE    Subjective/Complaints: Frequent urination no burning, urine appears clear to pt Has urgency, no abd pain No bowel issues , discussed CBGs  ROS: pt denies nausea, vomiting, diarrhea, cough, shortness of breath or chest pain   Objective: Vital Signs: Blood pressure (!) 147/68, pulse 63, temperature 98.1 F (36.7 C), temperature source Oral, resp. rate 18, height 5' 7"  (1.702 m), weight 85.7 kg (189 lb), SpO2 98 %. No results found.  Recent Labs  04/28/16 0515  WBC 6.0  HGB 11.5*  HCT 35.3*  PLT 195    Recent Labs  04/28/16 0515  NA 136  K 3.7  CL 103  GLUCOSE 146*  BUN 18  CREATININE 1.09*  CALCIUM 8.9   CBG (last 3)   Recent Labs  04/29/16 1634 04/29/16 2103 04/30/16 0642  GLUCAP 157* 85 139*    Wt Readings from Last 3 Encounters:  04/25/16 85.7 kg (189 lb)  04/22/16 74.8 kg (165 lb)  12/19/15 86.2 kg (190 lb)    Physical Exam:  Constitutional: She appears well-developedand well-nourished.  HENT:  Head: Normocephalic.  Right Ear: External earnormal.  Left Ear: External earnormal.  Eyes: EOMare normal. Pupils are equal, round, and reactive to light.  Pupils round and reactive to light Neck: Normal range of motion. Neck supple. No tracheal deviationpresent. No thyromegalypresent.  Cardiovascular: RRR.  Respiratory: clear without wheezes.  GI: Soft. Bowel sounds are normal. She exhibits no distension. There is no tenderness. There is no rebound.  Musculoskeletal: She exhibits no edema.  Neurological: She is alert.  Alert. Oriented to self, place, month.  Mild left inattention. Sees to all 4 quadrants but less attentive to lower left.. Remains somewhatApraxic. Seems to sense pain in all 4. Motor 4/5 to 5/5 in all 4 limbs proximal to distal. .senses pain in all 4 limbs. DTR's are 1+.  Skin: maculopapular rash over back.  Psychiatric: pleasnt and  cooperative  Assessment/Plan: 1. 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 Physiatrist is providing close team supervision and 24 hour management of active medical problems listed below. Physiatrist and rehab team continue to assess barriers to discharge/monitor patient progress toward functional and medical goals.  Function:  Bathing Bathing position   Position: Shower  Bathing parts Body parts bathed by patient: Right arm, Left arm, Chest, Abdomen, Front perineal area, Right upper leg, Left upper leg, Buttocks, Right lower leg, Left lower leg Body parts bathed by helper: Back  Bathing assist Assist Level: Supervision or verbal cues      Upper Body Dressing/Undressing Upper body dressing   What is the patient wearing?: Bra, Pull over shirt/dress Bra - Perfomed by patient: Thread/unthread right bra strap, Thread/unthread left bra strap Bra - Perfomed by helper: Hook/unhook bra (pull down sports bra) Pull over shirt/dress - Perfomed by patient: Thread/unthread right sleeve, Thread/unthread left sleeve Pull over shirt/dress - Perfomed by helper: Put head through opening, Pull shirt over trunk        Upper body assist Assist Level: Set up      Lower Body Dressing/Undressing Lower body dressing   What is the patient wearing?: Underwear, Pants, [employer], Shoes Underwear - Performed by patient: Thread/unthread right underwear leg, Thread/unthread left underwear leg, Pull underwear up/down Underwear - Performed by helper: Thread/unthread right underwear leg, Thread/unthread left underwear leg,  Pull underwear up/down Pants- Performed by patient: Thread/unthread right pants leg, Thread/unthread left pants leg, Pull pants up/down Pants- Performed by helper: Pull pants up/down, Fasten/unfasten pants   Non-skid slipper socks- Performed  by helper: Don/doff right sock, Don/doff left sock     Shoes - Performed by patient: Don/doff right shoe, Don/doff left shoe         TED Hose - Performed by helper: Don/doff right TED hose, Don/doff left TED hose  Lower body assist Assist for lower body dressing: Supervision or verbal cues      Toileting Toileting Toileting activity did not occur: Safety/medical concerns Toileting steps completed by patient: Adjust clothing prior to toileting Toileting steps completed by helper: Adjust clothing after toileting Toileting Assistive Devices: Grab bar or rail  Toileting assist Assist level: Supervision or verbal cues   Transfers Chair/bed transfer   Chair/bed transfer method: Ambulatory Chair/bed transfer assist level: Touching or steadying assistance (Pt > 75%) Chair/bed transfer assistive device: Medical sales representative     Max distance: 130' Assist level: Touching or steadying assistance (Pt > 75%)   Wheelchair   Type: Manual Max wheelchair distance: 136f  Assist Level: Supervision or verbal cues  Cognition Comprehension Comprehension assist level: Follows basic conversation/direction with extra time/assistive device  Expression Expression assist level: Expresses basic needs/ideas: With extra time/assistive device  Social Interaction Social Interaction assist level: Interacts appropriately with others with medication or extra time (anti-anxiety, antidepressant).  Problem Solving Problem solving assist level: Solves basic 75 - 89% of the time/requires cueing 10 - 24% of the time  Memory Memory assist level: Recognizes or recalls 75 - 89% of the time/requires cueing 10 - 24% of the time   Medical Problem List and Plan: 1. Decreased functional mobility with balance deficits processing deficits as well as apraxiasecondary to small right cortical brain infarct/?seizure -Team conference today please see physician documentation under team conference tab,  met with team face-to-face to discuss problems,progress, and goals. Formulized individual treatment plan based on medical history, underlying problem and comorbidities. 2. DVT Prophylaxis/Anticoagulation: Subcutaneous Lovenox. Monitor platelet counts and any signs of bleeding 3. Pain Management: Tylenol as needed 4. Mood: Zoloft 10461mdaily 5. Neuropsych: This patient iscapable of making decisions on herown behalf. 6. Skin/Wound Care: contact rash to back  -steroid cream/sarna/double sheets 7. Fluids/Electrolytes/Nutrition: I personally reviewed the patient's labs today.   -all near normal 8.Seizure prophylaxis. Keppra-XR 500 mg daily 9..Diabetes mellitus with peripheral neuropathy. Hemoglobin A1c 7.1. Patient on Glucotrol 10 mg twice a day, Glucophage 1000 mg twice a day prior to admission.   -resume glucotrol at 61m24mid- CBG improving CBG (last 3)   Recent Labs  04/29/16 1634 04/29/16 2103 04/30/16 0642  GLUCAP 157* 85 139*    11.Hypertension. No current antihypertensive medication. Patient on Norvasc 5 mg daily prior to admission.   -restarted 12/4 monitor Vitals:   04/29/16 1443 04/30/16 0604  BP: 131/70 (!) 147/68  Pulse: 67 63  Resp: 18 18  Temp: 98 F (36.7 C) 98.1 F (36.7 C)   12.Hypothyroidism. Synthroid 13.Hyperlipidemia. Lipitor 14.  Urinary freq, post CVA, check UA, consider anticholinergic if neg  LOS (Days) 5 A FACE TO FACE EVALUATION WAS PERFORMED  KIRAlysia Penna12/10/2015 7:40 AM

## 2016-04-30 NOTE — Progress Notes (Signed)
Speech Language Pathology Daily Session Note  Patient Details  Name: Amy [name] MRN: [id number] Date of Birth: 1929/06/15  Today's Date: 04/30/2016 SLP Individual Time: 1500-1530 SLP Individual Time Calculation (min): 30 min   Short Term Goals: Week 1: SLP Short Term Goal 1 (Week 1): Pt will demonstrate sustained attention to functional tasks for ~5 minutes with Mod A verbal cues for redirection.  SLP Short Term Goal 2 (Week 1): Pt will demonstrate intellectual awareness by stating 1 cognitive deficits related to current CVA with Mod A verbal cues.  SLP Short Term Goal 3 (Week 1): Pt will demonstrate functional problem solving for mildly complex tasks with Mod A verbal cues.  SLP Short Term Goal 4 (Week 1): Pt will recall and follow 3 step directions with 75% accuracy and Mod A verbal cues.   Skilled Therapeutic Interventions:  Pt was seen for skilled ST targeting cognitive goals.  SLP facilitated the session with a novel card game to address functional problem solving.  Pt required min assist verbal cues for working memory of task rules and procedures as well as to plan and execute a problem solving strategy during task.  Discussed with pt's daughter who was present activities for cognitive remediation and compensation that family could address to maximize treatment effects in between therapy session.  Pt was left in recliner with call bell within reach and family at bedside.  Continue per current plan of care.    Function:  Eating Eating                 Cognition Comprehension Comprehension assist level: Follows basic conversation/direction with extra time/assistive device  Expression   Expression assist level: Expresses basic needs/ideas: With extra time/assistive device  Social Interaction Social Interaction assist level: Interacts appropriately with others with medication or extra time (anti-anxiety, antidepressant).  Problem Solving Problem solving assist level: Solves  basic 75 - 89% of the time/requires cueing 10 - 24% of the time  Memory Memory assist level: Recognizes or recalls 50 - 74% of the time/requires cueing 25 - 49% of the time    Pain Pain Assessment Pain Assessment: No/denies pain  Therapy/Group: Individual Therapy  Saif Peter, Selinda Orion 04/30/2016, 4:13 PM

## 2016-04-30 NOTE — Progress Notes (Signed)
Occupational Therapy Session Note  Patient Details  Name: Amy [name] MRN: [id number] Date of Birth: 04/01/1930  Today's Date: 04/30/2016 OT Individual Time: 1100-1200 OT Individual Time Calculation (min): 60 min     Short Term Goals:No short term goals set  Skilled Therapeutic Interventions/Progress Updates:    Pt seen for ADL retraining to include toileting, shower, dressing with focus on dynamic balance. Pt demonstrated excellent visual attention today with improved balance. Pt stood for entire shower except sat to wash feet.  Pt was able to stand with S with adjusting clothing over hips and at sink for grooming. Pt taken to lunch via w/c.  Therapy Documentation Precautions:  Precautions Precautions: Fall Precaution Comments: last fall in her yard Restrictions Weight Bearing Restrictions: No Vital Signs: Therapy Vitals Pulse Rate: 73 BP: 128/73 Patient Position (if appropriate): Sitting Pain: Pain Assessment Pain Assessment: No/denies pain Pain Score: 0-No pain ADL: ADL ADL Comments: see Functional Assessment Tool  See Function Navigator for Current Functional Status.   Therapy/Group: Individual Therapy  SAGUIER,JULIA 04/30/2016, 12:34 PM

## 2016-04-30 NOTE — Progress Notes (Signed)
Social Work Patient ID: Amy [name], female   DOB: 01-12-1930, 82 y.o.   MRN: [id number]  Family meeting with two daughter's and son in-law and pt came in later to discuss team conference goals supervision level And target discharge 12/13. Family feels it is not what they were told on admission 12-16 days. Pt can not go to independent living from here and assisted does not offer the assistance she will need. Have discussed Short term NH and then see what level she is and if she continues to flucuates in the her levels. Pt has been to [place] [place] several times to visit friends, so she would be interested in this one. Dan-PA has met with Family to inform of pt's medical issues and test results. Pt does acknowledge her feeling of nausea and dizziness but is doing better now. She has this issue throughout the day also. MD checking her for a UTI. Will work on The appropriate discharge place.

## 2016-04-30 NOTE — Progress Notes (Addendum)
Physical Therapy Note  Patient Details  Name: Amy [name] MRN: [id number] Date of Birth: Feb 11, 1930 Today's Date: 04/30/2016   tx 1:  [id number], 100 min individual tx (30 min make-up time) Pain: none reported  Pt alert and orientated x 4. Pt stated she had had 2 falls in last year.  She mistepped and fell to the floor or ground, but did not injure herself.  She stated she has not felt well x 2 months and has not been to the gym for that long. PT donned pt's TEDs and socks.   Pt had bowel urgency x 3 during session; gait in room without AD with min assist to toilet and sink.  Continent  B and B. Pt felt weak (bil UES) and nauseated after using toilet; PT informed RN. BP 128/72, HR 73. Pt impulsively stood up from the toilet on 3rd visit, before PT asked her to.   neuromuscular re-education via demo for unsupported seated scapular adduction with rowing, no weight using yard stick, x 10 focusing on anterior pelvic tilt, cervical alignment; seated long arc quad knee ext with ankle DF 5 second hold, x 5 R/L; seated heel/toe raises x 20 bil; seated marching x 10 reps. Use of bil UEs to propel w/c with supervision, cues for correcting drift to R.  Mod cues for hand placement during sit>< stand.  Pt had difficulty using telephone to call dtr. She was able to remember items she wanted to ask dtr to bring.  Pt left resting in w/c iwht quick release belt on.  Falls precautions reiterated with pt.  tx 2:  1410-1500, 50 min individual tx Pain: none reported  Pt awakened easily and needed to use toilet for continent voiding.  Gait in room with RW. Pt stated her GI system felt better.  W/c propulsion as above, using bil UEs, veering R consistently requing min assist to correct.  Neuromuscular re-education via forced use, multimodal cues for wt shifting L, full hip and knee ext L, trunk righting and head righting, on Kinetron in sitting and standing without UE support in sitting, with bil UE support in  standing. Pt had difficulty with dual task of counting cycles out loud as she performed marching.    Pt has poor wt shift to L and delayed trunk righting.  Also she performed bil shoulder adduction in sitting x 2 x 10 to address trunk flexion and kyphosis. Gait with RW to return to room, x 130' with min guard assist, mod cues for upright trunk, forward gaze.   Pt left resting in recliner with family present.  PT educated family on falls risk and need to use quick release belt on pt if they leave room. Idil Maslanka 04/30/2016, 7:55 AM

## 2016-04-30 NOTE — Progress Notes (Signed)
Recreational Therapy Session Note  Patient Details  Name: Amy [name] MRN: [id number] Date of Birth: Jan 28, 1930 Today's Date: 04/30/2016  Pain: no c/o Skilled Therapeutic Interventions/Progress Updates: Pt participated in animal assisted activity/therapy seated w/c level with supervision.   [place] 04/30/2016, 3:47 PM

## 2016-05-01 ENCOUNTER — Inpatient Hospital Stay (HOSPITAL_COMMUNITY): Payer: Medicare Other

## 2016-05-01 ENCOUNTER — Inpatient Hospital Stay (HOSPITAL_COMMUNITY): Payer: Medicare Other | Admitting: Physical Therapy

## 2016-05-01 ENCOUNTER — Inpatient Hospital Stay (HOSPITAL_COMMUNITY): Payer: Medicare Other | Admitting: Speech Pathology

## 2016-05-01 ENCOUNTER — Inpatient Hospital Stay (HOSPITAL_COMMUNITY): Payer: Medicare Other | Admitting: Occupational Therapy

## 2016-05-01 LAB — URINE CULTURE

## 2016-05-01 LAB — GLUCOSE, CAPILLARY
GLUCOSE-CAPILLARY: 120 mg/dL — AB (ref 65–99)
Glucose-Capillary: 102 mg/dL — ABNORMAL HIGH (ref 65–99)
Glucose-Capillary: 97 mg/dL (ref 65–99)
Glucose-Capillary: 149 mg/dL — ABNORMAL HIGH (ref 65–99)
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
## 2016-05-01 NOTE — Progress Notes (Signed)
Physical Therapy Session Note  Patient Details  Name: Amy [name] MRN: [id number] Date of Birth: 08/03/1929  Today's Date: 05/01/2016 PT Individual Time: 1505-1530 PT Individual Time Calculation (min): 25 min    Short Term Goals: Week 1:  PT Short Term Goal 1 (Week 1): STG = LTG due to ELOS  Skilled Therapeutic Interventions/Progress Updates:   Pt received supine in bed, denies pain and agreeable to treatment. Supine>sit with S and bedrails. Sit <>stand from EOB x2 reps with S. Performed dynamic standing turning R/L in full circle with S. Gait x150' with minA/min guard and no AD. Performed car transfer with close S. Gait on ramp, uneven mulch surface with HHA, and performed up/down one curb step with 1 handrail and S. Returned to room gait [phone]' with HHA and min cues for attention to obstacles on R side, cues for trunk extension, scapular retraction. Remained seated in recliner at end of session, all needs in reach.   Therapy Documentation Precautions:  Precautions Precautions: Fall Precaution Comments: last fall in her yard Restrictions Weight Bearing Restrictions: No Pain: Pain Assessment Pain Assessment: No/denies pain   See Function Navigator for Current Functional Status.   Therapy/Group: Individual Therapy  Luberta Mutter 05/01/2016, 3:40 PM

## 2016-05-01 NOTE — Progress Notes (Signed)
[facility] PHYSICAL MEDICINE & REHABILITATION     PROGRESS NOTE    Subjective/Complaints: Frequent urination no burning, urine appears clear to pt Has urgency, no abd pain No bowel issues , discussed CBGs  ROS: pt denies nausea, vomiting, diarrhea, cough, shortness of breath or chest pain   Objective: Vital Signs: Blood pressure 140/62, pulse 63, temperature 98.3 F (36.8 C), temperature source Oral, resp. rate 18, height 5\' 7"  (1.702 m), weight 85.7 kg (189 lb), SpO2 98 %. No results found. No results for input(s): WBC, HGB, HCT, PLT in the last 72 hours. No results for input(s): NA, K, CL, GLUCOSE, BUN, CREATININE, CALCIUM in the last 72 hours.  Invalid input(s): CO CBG (last 3)   Recent Labs  04/30/16 1651 04/30/16 2059 05/01/16 0650  GLUCAP 153* 133* 102*    Wt Readings from Last 3 Encounters:  04/25/16 85.7 kg (189 lb)  04/22/16 74.8 kg (165 lb)  12/19/15 86.2 kg (190 lb)    Physical Exam:  Constitutional: She appears well-developedand well-nourished.  HENT:  Head: Normocephalic.  Right Ear: External earnormal.  Left Ear: External earnormal.  Eyes: EOMare normal. Pupils are equal, round, and reactive to light.  Pupils round and reactive to light Neck: Normal range of motion. Neck supple. No tracheal deviationpresent. No thyromegalypresent.  Cardiovascular: RRR.  Respiratory: clear without wheezes.  GI: Soft. Bowel sounds are normal. She exhibits no distension. There is no tenderness. There is no rebound.  Musculoskeletal: She exhibits no edema.  Neurological: She is alert.  Alert. Oriented to self, place, month.  Mild left inattention. Sees to all 4 quadrants but less attentive to lower left.. Remains somewhatApraxic. Seems to sense pain in all 4. Motor 4/5 to 5/5 in all 4 limbs proximal to distal. .senses pain in all 4 limbs. DTR's are 1+.  Skin: maculopapular rash over back.  Psychiatric: pleasnt and cooperative  Assessment/Plan: 1. 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 Physiatrist is providing close team supervision and 24 hour management of active medical problems listed below. Physiatrist and rehab team continue to assess barriers to discharge/monitor patient progress toward functional and medical goals.  Function:  Bathing Bathing position   Position: Shower  Bathing parts Body parts bathed by patient: Right arm, Left arm, Chest, Abdomen, Front perineal area, Right upper leg, Left upper leg, Buttocks, Right lower leg, Left lower leg Body parts bathed by helper: Back  Bathing assist Assist Level: Supervision or verbal cues      Upper Body Dressing/Undressing Upper body dressing   What is the patient wearing?: Bra, Pull over shirt/dress Bra - Perfomed by patient: Thread/unthread right bra strap, Thread/unthread left bra strap, Hook/unhook bra (pull down sports bra) Bra - Perfomed by helper: Hook/unhook bra (pull down sports bra) Pull over shirt/dress - Perfomed by patient: Thread/unthread right sleeve, Thread/unthread left sleeve, Put head through opening, Pull shirt over trunk Pull over shirt/dress - Perfomed by helper: Put head through opening, Pull shirt over trunk        Upper body assist Assist Level: Set up      Lower Body Dressing/Undressing Lower body dressing   What is the patient wearing?: Underwear, Pants, [employer], Shoes Underwear - Performed by patient: Thread/unthread right underwear leg, Thread/unthread left underwear leg, Pull underwear up/down Underwear - Performed by helper: Thread/unthread right underwear leg, Thread/unthread left underwear leg, Pull underwear up/down  Pants- Performed by patient: Thread/unthread right pants leg, Thread/unthread left pants leg, Pull pants up/down Pants- Performed by helper: Pull pants up/down, Fasten/unfasten  pants   Non-skid slipper socks- Performed by helper: Don/doff right sock, Don/doff left sock     Shoes - Performed by patient: Don/doff right shoe, Don/doff left shoe         TED Hose - Performed by helper: Don/doff right TED hose, Don/doff left TED hose  Lower body assist Assist for lower body dressing: Supervision or verbal cues      Toileting Toileting   Toileting steps completed by patient: Adjust clothing prior to toileting, Performs perineal hygiene, Adjust clothing after toileting Toileting steps completed by helper: Adjust clothing prior to toileting, Adjust clothing after toileting Toileting Assistive Devices: Grab bar or rail, Other (comment) Risk manager)  Toileting assist Assist level: Supervision or verbal cues   Transfers Chair/bed transfer Chair/bed transfer activity did not occur: N/A Chair/bed transfer method: Stand pivot Chair/bed transfer assist level: Touching or steadying assistance (Pt > 75%) Chair/bed transfer assistive device: Medical sales representative     Max distance: 10 Assist level: Touching or steadying assistance (Pt > 75%)   Wheelchair   Type: Manual Max wheelchair distance: 100 Assist Level: Supervision or verbal cues  Cognition Comprehension Comprehension assist level: Follows basic conversation/direction with extra time/assistive device  Expression Expression assist level: Expresses basic needs/ideas: With extra time/assistive device  Social Interaction Social Interaction assist level: Interacts appropriately with others with medication or extra time (anti-anxiety, antidepressant).  Problem Solving Problem solving assist level: Solves basic 75 - 89% of the time/requires cueing 10 - 24% of the time  Memory Memory assist level: Recognizes or recalls 50 - 74% of the time/requires cueing 25 - 49% of the time   Medical Problem List and Plan: 1. Decreased functional mobility with balance deficits processing deficits as well as  apraxiasecondary to small right cortical brain infarct/?seizure, in addition pt has multiple bilateral small vessel infarcts and microhemorhages which probably explain her decline over the last year -discussed 12/13 d/c date,  2. DVT Prophylaxis/Anticoagulation: Subcutaneous Lovenox. Monitor platelet counts and any signs of bleeding 3. Pain Management: Tylenol as needed 4. Mood: Zoloft 100mg  daily 5. Neuropsych: This patient iscapable of making decisions on herown behalf. 6. Skin/Wound Care: contact rash to back  -steroid cream/sarna/double sheets 7. Fluids/Electrolytes/Nutrition: I personally reviewed the patient's labs today.   -all near normal 8.Seizure prophylaxis. Keppra-XR 500 mg daily 9..Diabetes mellitus with peripheral neuropathy. Hemoglobin A1c 7.1. Patient on Glucotrol 10 mg twice a day, Glucophage 1000 mg twice a day prior to admission.   -resume glucotrol at 5mg  bid- CBG ok cont current dose CBG (last 3)   Recent Labs  04/30/16 1651 04/30/16 2059 05/01/16 0650  GLUCAP 153* 133* 102*    11.Hypertension. No current antihypertensive medication. Patient on Norvasc 5 mg daily prior to admission.   -restarted 12/4 monitor Vitals:   04/30/16 1523 05/01/16 0448  BP: (!) 126/59 140/62  Pulse: 65 63  Resp: 20 18  Temp: 97.8 F (36.6 C) 98.3 F (36.8 C)   12.Hypothyroidism. Synthroid 13.Hyperlipidemia. Lipitor 14.  Urinary freq, post CVA, neg UA, anticholinergic discussed but pt concerned that she is on too many meds  LOS (Days) 6 A FACE TO FACE EVALUATION WAS PERFORMED  KIRSTEINS,ANDREW E 05/01/2016 7:20 AM

## 2016-05-01 NOTE — Progress Notes (Signed)
Social Work Patient ID: Amy [name], female   DOB: 28-Dec-1929, 81 y.o.   MRN: [id number]   Spoke with [name]-daughter to discuss pt's insurance will not cover SNF due to feels pt is too high level at this time to go to a NH. Discussed the options of assisted living versus independent living with added services. Have contacted [facility] to speak with Venita Sheffield to ask questions and to have RN visit pt here and see the amiunt of care Pt will require at discharge. Will work on appropriate discharge plan for pt.

## 2016-05-01 NOTE — Progress Notes (Signed)
Speech Language Pathology Daily Session Note  Patient Details  Name: Amy [name] MRN: [id number] Date of Birth: [birth year]  Today's Date: 05/01/2016 SLP Individual Time: 1000-1045 SLP Individual Time Calculation (min): 45 min   Short Term Goals: Week 1: SLP Short Term Goal 1 (Week 1): Pt will demonstrate sustained attention to functional tasks for ~5 minutes with Mod A verbal cues for redirection.  SLP Short Term Goal 2 (Week 1): Pt will demonstrate intellectual awareness by stating 1 cognitive deficits related to current CVA with Mod A verbal cues.  SLP Short Term Goal 3 (Week 1): Pt will demonstrate functional problem solving for mildly complex tasks with Mod A verbal cues.  SLP Short Term Goal 4 (Week 1): Pt will recall and follow 3 step directions with 75% accuracy and Mod A verbal cues.   Skilled Therapeutic Interventions:  Pt was seen for skilled ST targeting cognitive goals.  SLP facilitated the session with medication management tasks to address functional problem solving.  Pt recalled function of medications when named for ~75% accuracy with supervision question cues.  Pt needed min assist verbal cues for task organization and working memory when organizing pills into a BID pill pox.  Pt was left in recliner with call bell within reach.  Continue per current plan of care.    Function:  Eating Eating                 Cognition Comprehension Comprehension assist level: Follows basic conversation/direction with extra time/assistive device  Expression   Expression assist level: Expresses basic needs/ideas: With extra time/assistive device  Social Interaction Social Interaction assist level: Interacts appropriately with others with medication or extra time (anti-anxiety, antidepressant).  Problem Solving Problem solving assist level: Solves basic 75 - 89% of the time/requires cueing 10 - 24% of the time  Memory Memory assist level: Recognizes or recalls 75 - 89% of the  time/requires cueing 10 - 24% of the time    Pain Pain Assessment Pain Assessment: No/denies pain  Therapy/Group: Individual Therapy  Amy [name], Amy [name] 05/01/2016, 12:18 PM

## 2016-05-01 NOTE — Progress Notes (Addendum)
Physical Therapy Note  Patient Details  Name: Amy [name] MRN: [id number] Date of Birth: 1929-12-08 Today's Date: 05/01/2016  1300-1430, 90 min individual tx Pain: none reported  Pt donned bil shoes with min assist for laces.  Gait in room with RW to toilet and sink. Pt continues to forget safe hand placement during sit>< stand.  Toilet transfer for continent voiding.  W/c propulsion usinb bil UEs x 100' with supervision, veering R consistently with improved correction today.  Pt did not remember hand placement in order to propel w/c, and needed cues.  PT instructed pt in turns and maneuvering in tight spaces with fair retention of techniques by pt.  neuromuscular re-education via forced use on Nustep for alternating reciprocal movement x 4 extremeities and trunk rotation at level 4 x 5 minutes. Gait on level tile with RW, min guard/supervision assist,  Up/down 12 steps 2 rails with close supervision, cues for sequencing with descending 6" steps with step to method, otherwise step through.  neuromuscular re-education via forced use, demo, and multimodal cues for trunk shortening/lengtheing/rotating when reaching L/R out of BOS.  Pt demonstrated poor L elongation and delayed righting reactions, but this improved with practice.  Pelvic dissociation in high sitting on mat with reciprocal scooting forward/backward, with more difficulty backward. Standing R/L hip abduction x 10 each, with focus on leading with heel to avoid hip external rotation.  Pt had to sit suddenly due to fatigue and nausea.  Returned pt to room, and she stated she needed to use toilet again, for continent small amount of urine.  Pt requested getting back to bed; left resting with bed alarm set and all needs in place; dtr and son in law present in room.  Joh Rao 05/01/2016, 1:33 PM

## 2016-05-01 NOTE — Progress Notes (Signed)
Occupational Therapy Session Note  Patient Details  Name: Amy [name] MRN: [id number] Date of Birth: Jun 11, 1929  Today's Date: 05/01/2016 OT Individual Time: 5397-6734 OT Individual Time Calculation (min): 15 min     Short Term Goals:No short term goals set  Skilled Therapeutic Interventions/Progress Updates:    Pt seen for skilled OT to facilitate processing skills and dynamic balance. Pt opted to not shower today. Pt ambulated in room to sink with close S and no AD.  Pt stood at sink to groom with good static balance and was able to sit to stand from recliner and balance during LB dressing with S. Pt moved quite slowly between steps as she was very talkative today and needed occasional cues to stay on task. Pt in room with all needs met.  D/c'd quick release belt in recliner/ chair as pt states she is fully aware of the fall precautions and possible consequences of getting up by herself.  Pt is aware of the need to use the call light for A.  Discussed with RN and will trial pt without belt to see if she can follow recommendations.  Therapy Documentation Precautions:  Precautions Precautions: Fall Precaution Comments: last fall in her yard Restrictions Weight Bearing Restrictions: No    Vital Signs: Therapy Vitals Temp: 98.3 F (36.8 C) Temp Source: Oral Pulse Rate: 63 Resp: 18 BP: 140/62 Patient Position (if appropriate): Lying Oxygen Therapy SpO2: 98 % O2 Device: Not Delivered Pain: no c/o pain   ADL: ADL ADL Comments: see Functional Assessment Tool  See Function Navigator for Current Functional Status.   Therapy/Group: Individual Therapy  [place] 05/01/2016, 8:30 AM

## 2016-05-02 ENCOUNTER — Inpatient Hospital Stay (HOSPITAL_COMMUNITY): Payer: Medicare Other | Admitting: Occupational Therapy

## 2016-05-02 ENCOUNTER — Inpatient Hospital Stay (HOSPITAL_COMMUNITY): Payer: Medicare Other | Admitting: Physical Therapy

## 2016-05-02 ENCOUNTER — Inpatient Hospital Stay (HOSPITAL_COMMUNITY): Payer: Medicare Other | Admitting: Speech Pathology

## 2016-05-02 DIAGNOSIS — N319 Neuromuscular dysfunction of bladder, unspecified: Secondary | ICD-10-CM

## 2016-05-02 LAB — GLUCOSE, CAPILLARY
GLUCOSE-CAPILLARY: 104 mg/dL — AB (ref 65–99)
Glucose-Capillary: 126 mg/dL — ABNORMAL HIGH (ref 65–99)
Glucose-Capillary: 156 mg/dL — ABNORMAL HIGH (ref 65–99)
Glucose-Capillary: 140 mg/dL — ABNORMAL HIGH (ref 65–99)

## 2016-05-02 NOTE — Progress Notes (Signed)
Speech Language Pathology Weekly Progress and Session Note  Patient Details  Name: Amy [name] MRN: [id number] Date of Birth: [birth year]  Beginning of progress report period: April 26, 2016 End of progress report period: May 02, 2016  Today's Date: 05/02/2016 SLP Individual Time: 1400-1500 SLP Individual Time Calculation (min): 60 min   Short Term Goals: Week 1: SLP Short Term Goal 1 (Week 1): Pt will demonstrate sustained attention to functional tasks for ~5 minutes with Mod A verbal cues for redirection.  SLP Short Term Goal 1 - Progress (Week 1): Met SLP Short Term Goal 2 (Week 1): Pt will demonstrate intellectual awareness by stating 1 cognitive deficits related to current CVA with Mod A verbal cues.  SLP Short Term Goal 2 - Progress (Week 1): Met SLP Short Term Goal 3 (Week 1): Pt will demonstrate functional problem solving for mildly complex tasks with Mod A verbal cues.  SLP Short Term Goal 3 - Progress (Week 1): Met SLP Short Term Goal 4 (Week 1): Pt will recall and follow 3 step directions with 75% accuracy and Mod A verbal cues.  SLP Short Term Goal 4 - Progress (Week 1): Met    New Short Term Goals: Week 2: SLP Short Term Goal 1 (Week 2): Pt will demonstrate sustained attention to functional tasks for ~5 minutes with Min A verbal cues for redirection.  SLP Short Term Goal 2 (Week 2): Pt will demonstrate intellectual awareness by stating 1 cognitive deficits related to current CVA with Min A verbal cues.  SLP Short Term Goal 3 (Week 2): Pt will demonstrate functional problem solving for mildly complex tasks with Min A verbal cues.  SLP Short Term Goal 4 (Week 2): Pt will recall and follow 3 step directions with 90% accuracy and Min A verbal cues.   Weekly Progress Updates:Pt has made functional gains and has met 5 of 5 STGs this reporting period due to improved cognitive function. Currently the pt continues to require cues for sustain attention with Max cues needed in  mildly distracting environment. Pt continues to require skilled ST services to increased functional independence and safety prior to discharge to target areas of attention, problem solving and awareness of deficits.    Intensity: Minumum of 1-2 x/day, 30 to 90 minutes Frequency: 3 to 5 out of 7 days Duration/Length of Stay: 05/07/16 Treatment/Interventions: Functional tasks;Patient/family education;Cognitive remediation/compensation;Cueing hierarchy;Therapeutic Activities;Internal/external aids   Daily Session  Skilled Therapeutic Interventions: Skilled treatment session focused on pt's cognition goals. SLP facilitated session by providing Mod A faded to Min A verbal cues for sustained attention in mildly distraction environment. Pt able to learn novel card game with Mod A verbal cues faded to Min A cues with repeated attempts. Pt able to state that she has difficulty with attention due to her CVA. Pt returned to room and transferred to recliner. All needs were within reach. Continue current plan of care.      Function:   Eating Eating   Modified Consistency Diet: No Eating Assist Level: No help, No cues           Cognition Comprehension Comprehension assist level: Follows complex conversation/direction with extra time/assistive device  Expression   Expression assist level: Expresses complex ideas: With extra time/assistive device  Social Interaction Social Interaction assist level: Interacts appropriately with others with medication or extra time (anti-anxiety, antidepressant).  Problem Solving Problem solving assist level: Solves basic 50 - 74% of the time/requires cueing 25 - 49% of the time (Possibly d/t  attention)  Memory Memory assist level: Recognizes or recalls 75 - 89% of the time/requires cueing 10 - 24% of the time   General    Pain    Therapy/Group: Individual Therapy  Katherine Tout 05/02/2016, 4:07 PM

## 2016-05-02 NOTE — Progress Notes (Signed)
Occupational Therapy Session Note  Patient Details  Name: Amy [name] MRN: [id number] Date of Birth: November 15, 1929  Today's Date: 05/02/2016 OT Individual Time: 7829-5621 OT Individual Time Calculation (min): 75 min     Short Term Goals:No short term goals set  Skilled Therapeutic Interventions/Progress Updates:    Pt seen this session to address dynamic balance and STM skills with ADL retraining.  Pt received in bed, ambulated to toilet and then shower with RW with S. Pt requested to stand the entire shower vs using chair. Pt used bars to steady self when lifting feet up for washing.  Ambulated to recliner for dressing. Discussed discharge plans to ALF and recommendations that her daughter stop by daily to be there when she showers and to ensure she has her medications set up correctly in the pill boxes.  Pt completed more grooming with drying and curling hair and applying makeup. Worked on balance with ambulation in hallway with RW with cues for upright posture as she tends to lean forward over the RW.  Addressed STM skills with reviewing what she did in previous therapy sessions. She did have good recall of therapists, but confused the PT with the OT.  Pt resting in recliner with all needs met.   Therapy Documentation Precautions:  Precautions Precautions: Fall Precaution Comments: last fall in her yard Restrictions Weight Bearing Restrictions: No     Vital Signs: Therapy Vitals Temp: 98.8 F (37.1 C) Temp Source: Oral Pulse Rate: (!) 59 Resp: 17 BP: 126/64 Patient Position (if appropriate): Lying Oxygen Therapy SpO2: 95 % O2 Device: Not Delivered Pain: Pain Assessment Pain Assessment: No/denies pain ADL: ADL ADL Comments: see Functional Assessment Tool  See Function Navigator for Current Functional Status.   Therapy/Group: Individual Therapy  Michelena Culmer 05/02/2016, 9:07 AM

## 2016-05-02 NOTE — Progress Notes (Signed)
[facility] PHYSICAL MEDICINE & REHABILITATION     PROGRESS NOTE    Subjective/Complaints: Discussed post d/c needs with pt, at [place] for gait currently, will likely be at int sup level by d/c  ROS: pt denies nausea, vomiting, diarrhea, cough, shortness of breath or chest pain   Objective: Vital Signs: Blood pressure 126/64, pulse (!) 59, temperature 98.8 F (37.1 C), temperature source Oral, resp. rate 17, height 5\' 7"  (1.702 m), weight 85.7 kg (189 lb), SpO2 95 %. No results found. No results for input(s): WBC, HGB, HCT, PLT in the last 72 hours. No results for input(s): NA, K, CL, GLUCOSE, BUN, CREATININE, CALCIUM in the last 72 hours.  Invalid input(s): CO CBG (last 3)   Recent Labs  05/01/16 1639 05/01/16 2028 05/02/16 0647  GLUCAP 97 149* 126*    Wt Readings from Last 3 Encounters:  04/25/16 85.7 kg (189 lb)  04/22/16 74.8 kg (165 lb)  12/19/15 86.2 kg (190 lb)    Physical Exam:  Constitutional: She appears well-developedand well-nourished.  HENT:  Head: Normocephalic.  Right Ear: External earnormal.  Left Ear: External earnormal.  Eyes: EOMare normal. Pupils are equal, round, and reactive to light.  Pupils round and reactive to light Neck: Normal range of motion. Neck supple. No tracheal deviationpresent. No thyromegalypresent.  Cardiovascular: RRR.  Respiratory: clear without wheezes.  GI: Soft. Bowel sounds are normal. She exhibits no distension. There is no tenderness. There is no rebound.  Musculoskeletal: She exhibits no edema.  Neurological: She is alert.  Alert. Oriented to self, place, month.  Mild left inattention. Sees to all 4 quadrants but less attentive to lower left.. Remains somewhatApraxic. Seems to sense pain in all 4. Motor 4/5 to 5/5 in all 4 limbs proximal to distal. .senses pain in all 4 limbs. DTR's are 1+.  Skin: maculopapular rash over back.  Psychiatric: pleasnt and cooperative  Assessment/Plan: 1. 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 Physiatrist is providing close team supervision and 24 hour management of active medical problems listed below. Physiatrist and rehab team continue to assess barriers to discharge/monitor patient progress toward functional and medical goals.  Function:  Bathing Bathing position   Position: Shower  Bathing parts Body parts bathed by patient: Right arm, Left arm, Chest, Abdomen, Front perineal area, Right upper leg, Left upper leg, Buttocks, Right lower leg, Left lower leg Body parts bathed by helper: Back  Bathing assist Assist Level: Supervision or verbal cues      Upper Body Dressing/Undressing Upper body dressing   What is the patient wearing?: Bra, Pull over shirt/dress Bra - Perfomed by patient: Thread/unthread right bra strap, Thread/unthread left bra strap, Hook/unhook bra (pull down sports bra) Bra - Perfomed by helper: Hook/unhook bra (pull down sports bra) Pull over shirt/dress - Perfomed by patient: Thread/unthread right sleeve, Thread/unthread left sleeve, Put head through opening, Pull shirt over trunk Pull over shirt/dress - Perfomed by helper: Put head through opening, Pull shirt over trunk        Upper body assist Assist Level: Set up      Lower Body Dressing/Undressing Lower body dressing   What is the patient wearing?: Underwear, Pants, [employer], Shoes Underwear - Performed by patient: Thread/unthread right underwear leg, Thread/unthread left underwear leg, Pull underwear up/down Underwear - Performed by helper: Thread/unthread right underwear leg, Thread/unthread left underwear leg, Pull underwear  up/down Pants- Performed by patient: Thread/unthread right pants leg, Thread/unthread left pants leg, Pull pants up/down Pants- Performed by helper: Pull pants up/down, Fasten/unfasten  pants   Non-skid slipper socks- Performed by helper: Don/doff right sock, Don/doff left sock     Shoes - Performed by patient: Don/doff right shoe, Don/doff left shoe         TED Hose - Performed by helper: Don/doff right TED hose, Don/doff left TED hose  Lower body assist Assist for lower body dressing: Supervision or verbal cues      Toileting Toileting Toileting activity did not occur: Safety/medical concerns Toileting steps completed by patient: Adjust clothing prior to toileting, Performs perineal hygiene, Adjust clothing after toileting Toileting steps completed by helper: Adjust clothing prior to toileting, Adjust clothing after toileting Toileting Assistive Devices: Grab bar or rail  Toileting assist Assist level: Touching or steadying assistance (Pt.75%)   Transfers Chair/bed transfer Chair/bed transfer activity did not occur: N/A Chair/bed transfer method: Ambulatory Chair/bed transfer assist level: Supervision or verbal cues Chair/bed transfer assistive device: Medical sales representative     Max distance: 200 Assist level: Touching or steadying assistance (Pt > 75%)   Wheelchair   Type: Manual Max wheelchair distance: 100 Assist Level: Supervision or verbal cues  Cognition Comprehension Comprehension assist level: Follows basic conversation/direction with extra time/assistive device  Expression Expression assist level: Expresses basic needs/ideas: With extra time/assistive device  Social Interaction Social Interaction assist level: Interacts appropriately with others with medication or extra time (anti-anxiety, antidepressant).  Problem Solving Problem solving assist level: Solves basic 50 - 74% of the time/requires cueing 25 - 49% of the time  Memory Memory assist level: Recognizes or recalls 25 - 49% of the time/requires cueing 50 - 75% of the time   Medical Problem List and Plan: 1. Decreased functional mobility with balance deficits processing  deficits as well as apraxiasecondary to small right cortical brain infarct/?seizure, in addition pt has multiple bilateral small vessel infarcts and microhemorhages which probably explain her decline over the last year -discussed 12/13 d/c date, would benefit from Assisted living post d/c 2. DVT Prophylaxis/Anticoagulation: Subcutaneous Lovenox. Monitor platelet counts and any signs of bleeding 3. Pain Management: Tylenol as needed 4. Mood: Zoloft 100mg  daily 5. Neuropsych: This patient iscapable of making decisions on herown behalf. 6. Skin/Wound Care: contact rash to back  -steroid cream/sarna/double sheets 7. Fluids/Electrolytes/Nutrition: I personally reviewed the patient's labs today.   -all near normal 8.Seizure prophylaxis. Keppra-XR 500 mg daily 9..Diabetes mellitus with peripheral neuropathy. Hemoglobin A1c 7.1. Patient on Glucotrol 10 mg twice a day, Glucophage 1000 mg twice a day prior to admission.   -resume glucotrol at 5mg  bid- CBG ok cont current dose CBG (last 3)   Recent Labs  05/01/16 1639 05/01/16 2028 05/02/16 0647  GLUCAP 97 149* 126*    11.Hypertension. No current antihypertensive medication. Patient on Norvasc 5 mg daily prior to admission.   -restarted 12/4 , BPs improved Vitals:   05/01/16 1500 05/02/16 0625  BP: 136/62 126/64  Pulse: 61 (!) 59  Resp: 18 17  Temp: 98.1 F (36.7 C) 98.8 F (37.1 C)   12.Hypothyroidism. Synthroid 13.Hyperlipidemia. Lipitor 14.  Spastic bladder, post CVA, neg UA, anticholinergic discussed but pt concerned that she is on too many meds  LOS (Days) 7 A FACE TO FACE EVALUATION WAS PERFORMED  Charlett Blake 05/02/2016 6:58 AM

## 2016-05-02 NOTE — Progress Notes (Addendum)
Social Work Patient ID: Amy [name], female   DOB: 1930/01/03, 82 y.o.   MRN: [id number]  Spoke with [name] to discuss pt's care needed and if independent living with some added services would be the most  Appropriate place for her. Pt, MD, team and myself feel pt will be safe in an independent living with assistance with bathing and dressing and medication management. Pt is reaching supervision level with ambulation and is feeling better  Each day. Spoke with pt and daughter-[name] to discuss teams' and MD recommendations and daughter will pursue the independent room at [facility] pt would rather go there than a NH, which is not the appropriate level for her. Will work on transitioning pt to [facility]. Daughter to contact facility today. [facility] from faxed to them after pt signed her consent.

## 2016-05-02 NOTE — Progress Notes (Signed)
Physical Therapy Session Note  Patient Details  Name: Amy [name] MRN: [id number] Date of Birth: 02-03-1930  Today's Date: 05/02/2016 PT Individual Time: 1101-1200 PT Individual Time Calculation (min): 59 min    Short Term Goals: Week 1:  PT Short Term Goal 1 (Week 1): STG = LTG due to ELOS  Skilled Therapeutic Interventions/Progress Updates:     Pt received sitting in recliner and agreeable to PT.   Gait trainnig with RW and supervision assist x 19ft, 116ft, 167ft, 153ft. Patient noted to to have poor posture throughout gait training and needed cues to stand erect and minimize looking at floor. Gait training without AD through day room x 60 ft and close supervision assist.  Dynamic balance training to water plants in day room without RW and refill water pot as needed.Supervision assist and Min cues from PT for problem solving obstacle negotiation.    Nustep training/reciprocal movement pattern training x 12 minutes. Level 3>5 with min cues from PT for proper speed of movement. Patient able to sustain attention and complete task while talking without only min cues.  [place] level B dynamic balance training. Side stepping 3ft x 2 Tandem walking with 1 UE support x 52ft  Retro-gait with min assist and no UE support. X 25 ft  Figure 8 at 6 ft. No UE support PT provided min assist throughout [place] exercises as well as min cues for increased BOS with side stepping and retro-gait.   Patient returned to room and left sitting in [facility] with call bell in reach.       Therapy Documentation Precautions:  Precautions Precautions: Fall Precaution Comments: last fall in her yard Restrictions Weight Bearing Restrictions: No General:   Vital Signs:  Pain: Pain Assessment Pain Assessment: No/denies pain Mobility:   Locomotion :    Trunk/Postural Assessment :    Balance:   Exercises:   Other Treatments:     See Function Navigator for Current Functional  Status.   Therapy/Group: Individual Therapy  Lorie Phenix 05/02/2016, 11:58 AM

## 2016-05-03 ENCOUNTER — Ambulatory Visit (HOSPITAL_COMMUNITY): Payer: Medicare Other | Admitting: Physical Therapy

## 2016-05-03 ENCOUNTER — Inpatient Hospital Stay (HOSPITAL_COMMUNITY): Payer: Medicare Other | Admitting: Speech Pathology

## 2016-05-03 LAB — URINALYSIS, ROUTINE W REFLEX MICROSCOPIC
Bilirubin Urine: NEGATIVE
Glucose, UA: NEGATIVE mg/dL
HGB URINE DIPSTICK: NEGATIVE
KETONES UR: NEGATIVE mg/dL
LEUKOCYTES UA: NEGATIVE
Nitrite: NEGATIVE
PROTEIN: NEGATIVE mg/dL
Specific Gravity, Urine: 1.011 (ref 1.005–1.030)
pH: 5 (ref 5.0–8.0)

## 2016-05-03 LAB — GLUCOSE, CAPILLARY
GLUCOSE-CAPILLARY: 103 mg/dL — AB (ref 65–99)
GLUCOSE-CAPILLARY: 105 mg/dL — AB (ref 65–99)
Glucose-Capillary: 118 mg/dL — ABNORMAL HIGH (ref 65–99)
Glucose-Capillary: 162 mg/dL — ABNORMAL HIGH (ref 65–99)

## 2016-05-03 NOTE — Progress Notes (Addendum)
Speech Language Pathology Daily Session Note  Patient Details  Name: Amy [name] MRN: [id number] Date of Birth: 10/19/1929  Today's Date: 05/03/2016 SLP Individual Time: 0930-1000 SLP Individual Time Calculation (min): 30 min   Short Term Goals: Week 2: SLP Short Term Goal 1 (Week 2): Pt will demonstrate sustained attention to functional tasks for ~5 minutes with Min A verbal cues for redirection.  SLP Short Term Goal 2 (Week 2): Pt will demonstrate intellectual awareness by stating 1 cognitive deficits related to current CVA with Min A verbal cues.  SLP Short Term Goal 3 (Week 2): Pt will demonstrate functional problem solving for mildly complex tasks with Min A verbal cues.  SLP Short Term Goal 4 (Week 2): Pt will recall and follow 3 step directions with 90% accuracy and Min A verbal cues.   Skilled Therapeutic Interventions: Skilled treatment session focused on cognition goals. SLP facilitated session by providing supervision to Mod I problem solving deductive reasoning puzzle. Pt with increased sustained attention to functional tasks for ~10 minutes with Min A verbal cues. Pt left in room and PT. Continue current plan of care.   Function:   Cognition Comprehension Comprehension assist level: Follows complex conversation/direction with no assist  Expression   Expression assist level: Expresses complex ideas: With no assist  Social Interaction Social Interaction assist level: Interacts appropriately with others - No medications needed.  Problem Solving Problem solving assist level: Solves basic problems with no assist  Memory Memory assist level: Recognizes or recalls 90% of the time/requires cueing < 10% of the time    Pain    Therapy/Group: Individual Therapy  Chelesea Weiand 05/03/2016, 10:15

## 2016-05-03 NOTE — Progress Notes (Signed)
Physical Therapy Session Note  Patient Details  Name: Amy [name] MRN: [id number] Date of Birth: [birth year]  Today's Date: 05/03/2016 PT Individual Time: 1005-1100 PT Individual Time Calculation (min): 55 min    Short Term Goals: Week 1:  PT Short Term Goal 1 (Week 1): STG = LTG due to ELOS  Skilled Therapeutic Interventions/Progress Updates:    Pt received walking through room without AD or assistance, stating that she was just trying to get dressed. PT educated patient on need for staff when walking around room for safety due to balance deficits.  PT instructed patient in gait training through hospital with RW x 528f and emphasis on problem solving to locate hospital gift shop with signs in unfamiliar environment. Pt required moderate cues for awareness of surrounding and to retain location in hospital of gift shop. Gait through gift shop with RW distant supervision assist from PT.    Balance and problem solving training to build pipe tree cross on level surface. Pt instructed in building field goal with pip tree on 3 inch wedge. Patient required min assist initially to prevent posterior LOB, but able to self correct and only require supervision assist for balance for last 5 minutes. Moderate cues to refer to reference picture as well as identifying errors.   Patient returned to room and left sitting in recliner with seat alarm set and all needs met.     Therapy Documentation Precautions:  Precautions Precautions: Fall Precaution Comments: last fall in her yard Restrictions Weight Bearing Restrictions: No   See Function Navigator for Current Functional Status.   Therapy/Group: Individual Therapy  Amy Phenix12/01/2016, 1:25 PM

## 2016-05-03 NOTE — Progress Notes (Signed)
[place] PHYSICAL MEDICINE & REHABILITATION     PROGRESS NOTE    Subjective/Complaints: Pt without new issues overnite  ROS: pt denies nausea, vomiting, diarrhea, cough, shortness of breath or chest pain   Objective: Vital Signs: Blood pressure (!) 147/56, pulse 62, temperature 98.4 F (36.9 C), temperature source Oral, resp. rate 18, height 5\' 7"  (1.702 m), weight 88.5 kg (195 lb), SpO2 99 %. No results found. No results for input(s): WBC, HGB, HCT, PLT in the last 72 hours. No results for input(s): NA, K, CL, GLUCOSE, BUN, CREATININE, CALCIUM in the last 72 hours.  Invalid input(s): CO CBG (last 3)   Recent Labs  05/02/16 1631 05/02/16 2039 05/03/16 0652  GLUCAP 156* 140* 118*    Wt Readings from Last 3 Encounters:  05/02/16 88.5 kg (195 lb)  04/22/16 74.8 kg (165 lb)  12/19/15 86.2 kg (190 lb)    Physical Exam:  Constitutional: She appears well-developedand well-nourished.  HENT:  Head: Normocephalic.  Right Ear: External earnormal.  Left Ear: External earnormal.  Eyes: EOMare normal. Pupils are equal, round, and reactive to light.  Pupils round and reactive to light Neck: Normal range of motion. Neck supple. No tracheal deviationpresent. No thyromegalypresent.  Cardiovascular: RRR.  Respiratory: clear without wheezes.  GI: Soft. Bowel sounds are normal. She exhibits no distension. There is no tenderness. There is no rebound.  Musculoskeletal: She exhibits no edema.  Neurological: She is alert.  Alert. Oriented to self, place, month.  Mild left inattention. Sees to all 4 quadrants but less attentive to lower left.. Remains somewhatApraxic. Seems to sense pain in all 4. Motor 4/5 to 5/5 in all 4 limbs proximal to distal. .senses pain in all 4 limbs. DTR's are 1+.  Skin: maculopapular rash over back.  Psychiatric: pleasnt and cooperative  Assessment/Plan: 1. Functional and mobility deficits secondary to small right cortical brain infarct with  ?seizure which require 3+ hours per day of interdisciplinary therapy in a comprehensive inpatient rehab setting. Physiatrist is providing close team supervision and 24 hour management of active medical problems listed below. Physiatrist and rehab team continue to assess barriers to discharge/monitor patient progress toward functional and medical goals.  Function:  Bathing Bathing position   Position: Shower  Bathing parts Body parts bathed by patient: Right arm, Left arm, Chest, Abdomen, Front perineal area, Right upper leg, Left upper leg, Buttocks, Right lower leg, Left lower leg Body parts bathed by helper: Back  Bathing assist Assist Level: Supervision or verbal cues      Upper Body Dressing/Undressing Upper body dressing   What is the patient wearing?: Bra, Pull over shirt/dress Bra - Perfomed by patient: Thread/unthread right bra strap, Thread/unthread left bra strap, Hook/unhook bra (pull down sports bra) Bra - Perfomed by helper: Hook/unhook bra (pull down sports bra) Pull over shirt/dress - Perfomed by patient: Thread/unthread right sleeve, Thread/unthread left sleeve, Put head through opening, Pull shirt over trunk Pull over shirt/dress - Perfomed by helper: Put head through opening, Pull shirt over trunk        Upper body assist Assist Level: Set up      Lower Body Dressing/Undressing Lower body dressing   What is the patient wearing?: Underwear, Pants, [employer], Shoes Underwear - Performed by patient: Thread/unthread right underwear leg, Thread/unthread left underwear leg, Pull underwear up/down Underwear - Performed by helper: Thread/unthread right underwear leg, Thread/unthread left underwear leg, Pull underwear up/down Pants- Performed by patient: Thread/unthread right pants leg, Thread/unthread left pants leg, Pull pants  up/down Pants- Performed by helper: Pull pants up/down, Fasten/unfasten pants   Non-skid slipper socks- Performed by helper: Don/doff right sock,  Don/doff left sock     Shoes - Performed by patient: Don/doff right shoe, Don/doff left shoe, Fasten right, Fasten left         TED Hose - Performed by helper: Don/doff right TED hose, Don/doff left TED hose  Lower body assist Assist for lower body dressing: Supervision or verbal cues      Toileting Toileting Toileting activity did not occur: Safety/medical concerns Toileting steps completed by patient: Adjust clothing prior to toileting, Performs perineal hygiene, Adjust clothing after toileting Toileting steps completed by helper: Adjust clothing prior to toileting, Adjust clothing after toileting Toileting Assistive Devices: Grab bar or rail  Toileting assist Assist level: Supervision or verbal cues   Transfers Chair/bed transfer Chair/bed transfer activity did not occur: N/A Chair/bed transfer method: Stand pivot Chair/bed transfer assist level: Supervision or verbal cues Chair/bed transfer assistive device: Armrests, Medical sales representative     Max distance:  175 Assist level: Supervision or verbal cues   Wheelchair   Type: Manual Max wheelchair distance: 100 Assist Level: Supervision or verbal cues  Cognition Comprehension Comprehension assist level: Follows complex conversation/direction with no assist  Expression Expression assist level: Expresses complex ideas: With no assist  Social Interaction Social Interaction assist level: Interacts appropriately with others - No medications needed.  Problem Solving Problem solving assist level: Solves basic problems with no assist  Memory Memory assist level: Recognizes or recalls 90% of the time/requires cueing < 10% of the time   Medical Problem List and Plan: 1. Decreased functional mobility with balance deficits processing deficits as well as apraxiasecondary to small right cortical brain infarct/?seizure, in addition pt has multiple bilateral small vessel infarcts and microhemorhages which probably explain  her decline over the last year Cont CIR, PT, OT, modI/Sup goals 2. DVT Prophylaxis/Anticoagulation: Subcutaneous Lovenox. Monitor platelet counts and any signs of bleeding 3. Pain Management: Tylenol as needed 4. Mood: Zoloft 100mg  daily 5. Neuropsych: This patient iscapable of making decisions on herown behalf. 6. Skin/Wound Care: contact rash to back  -steroid cream/sarna/double sheets 7. Fluids/Electrolytes/Nutrition: I personally reviewed the patient's labs today.   -all near normal 8.Seizure prophylaxis. Keppra-XR 500 mg daily 9..Diabetes mellitus with peripheral neuropathy. Hemoglobin A1c 7.1. Patient on Glucotrol 10 mg twice a day, Glucophage 1000 mg twice a day prior to admission.   -resume glucotrol at 5mg  bid- CBG ok cont current dose CBG (last 3)   Recent Labs  05/02/16 1631 05/02/16 2039 05/03/16 0652  GLUCAP 156* 140* 118*    11.Hypertension. No current antihypertensive medication. Patient on Norvasc 5 mg daily prior to admission.   -restarted 12/4 , BPs improved Vitals:   05/02/16 1337 05/03/16 0555  BP: (!) 146/65 (!) 147/56  Pulse: 60 62  Resp: 18 18  Temp: 97.8 F (36.6 C) 98.4 F (36.9 C)   12.Hypothyroidism. Synthroid 13.Hyperlipidemia. Lipitor 14.  Spastic bladder, post CVA, neg UA, anticholinergic discussed but pt concerned that she is on too many meds  LOS (Days) [place] E 05/03/2016 6:55 AM

## 2016-05-04 LAB — URINE CULTURE

## 2016-05-04 LAB — GLUCOSE, CAPILLARY
GLUCOSE-CAPILLARY: 122 mg/dL — AB (ref 65–99)
GLUCOSE-CAPILLARY: 137 mg/dL — AB (ref 65–99)
Glucose-Capillary: 78 mg/dL (ref 65–99)
Glucose-Capillary: 106 mg/dL — ABNORMAL HIGH (ref 65–99)

## 2016-05-04 MED ORDER — DOCUSATE SODIUM 100 MG PO CAPS
100.0000 mg | ORAL_CAPSULE | Freq: Two times a day (BID) | ORAL | Status: DC
  Administered 2016-05-04 – 2016-05-07 (×7): 100 mg via ORAL
  Filled 2016-05-04 (×7): qty 1

## 2016-05-04 NOTE — Progress Notes (Signed)
[facility] PHYSICAL MEDICINE & REHABILITATION     PROGRESS NOTE    Subjective/Complaints: Pt was up a lot last noc, had 2 BMs after sorbitol, also up to urinate Did not remember whether she walked to the gift shop with PT ROS: pt denies nausea, vomiting, diarrhea, cough, shortness of breath or chest pain   Objective: Vital Signs: Blood pressure (!) 144/69, pulse 65, temperature 98.2 F (36.8 C), temperature source Oral, resp. rate 18, height 5\' 7"  (1.702 m), weight 88.5 kg (195 lb), SpO2 93 %. No results found. No results for input(s): WBC, HGB, HCT, PLT in the last 72 hours. No results for input(s): NA, K, CL, GLUCOSE, BUN, CREATININE, CALCIUM in the last 72 hours.  Invalid input(s): CO CBG (last 3)   Recent Labs  05/03/16 1648 05/03/16 2039 05/04/16 0631  GLUCAP 105* 162* 122*    Wt Readings from Last 3 Encounters:  05/02/16 88.5 kg (195 lb)  04/22/16 74.8 kg (165 lb)  12/19/15 86.2 kg (190 lb)    Physical Exam:  Constitutional: She appears well-developedand well-nourished.  HENT:  Head: Normocephalic.  Right Ear: External earnormal.  Left Ear: External earnormal.  Eyes: EOMare normal. Pupils are equal, round, and reactive to light.  Pupils round and reactive to light Neck: Normal range of motion. Neck supple. No tracheal deviationpresent. No thyromegalypresent.  Cardiovascular: RRR.  Respiratory: clear without wheezes.  GI: Soft. Bowel sounds are normal. She exhibits no distension. There is no tenderness. There is no rebound.  Musculoskeletal: She exhibits no edema.  Neurological: She is alert.  Alert. Oriented to self, place, month.  Mild left inattention. Sees to all 4 quadrants but less attentive to lower left.. Remains somewhatApraxic. Seems to sense pain in all 4. Motor 4/5 to 5/5 in all 4 limbs proximal to distal. .senses pain in all 4 limbs. DTR's are 1+.  Skin: maculopapular rash over back.  Psychiatric: pleasnt and  cooperative  Assessment/Plan: 1. 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 Physiatrist is providing close team supervision and 24 hour management of active medical problems listed below. Physiatrist and rehab team continue to assess barriers to discharge/monitor patient progress toward functional and medical goals.  Function:  Bathing Bathing position   Position: Shower  Bathing parts Body parts bathed by patient: Right arm, Left arm, Chest, Abdomen, Front perineal area, Right upper leg, Left upper leg, Buttocks, Right lower leg, Left lower leg Body parts bathed by helper: Back  Bathing assist Assist Level: Supervision or verbal cues      Upper Body Dressing/Undressing Upper body dressing   What is the patient wearing?: Bra, Pull over shirt/dress Bra - Perfomed by patient: Thread/unthread right bra strap, Thread/unthread left bra strap, Hook/unhook bra (pull down sports bra) Bra - Perfomed by helper: Hook/unhook bra (pull down sports bra) Pull over shirt/dress - Perfomed by patient: Thread/unthread right sleeve, Thread/unthread left sleeve, Put head through opening, Pull shirt over trunk Pull over shirt/dress - Perfomed by helper: Put head through opening, Pull shirt over trunk        Upper body assist Assist Level: Set up      Lower Body Dressing/Undressing Lower body dressing   What is the patient wearing?: Underwear, Pants, [employer], Shoes Underwear - Performed by patient: Thread/unthread right underwear leg, Thread/unthread left underwear leg, Pull underwear up/down Underwear - Performed by helper: Thread/unthread right underwear  leg, Thread/unthread left underwear leg, Pull underwear up/down Pants- Performed by patient: Thread/unthread right pants leg, Thread/unthread left pants leg, Pull pants up/down Pants- Performed by  helper: Pull pants up/down, Fasten/unfasten pants   Non-skid slipper socks- Performed by helper: Don/doff right sock, Don/doff left sock     Shoes - Performed by patient: Don/doff right shoe, Don/doff left shoe, Fasten right, Fasten left         TED Hose - Performed by helper: Don/doff right TED hose, Don/doff left TED hose  Lower body assist Assist for lower body dressing: Supervision or verbal cues      Toileting Toileting Toileting activity did not occur: Safety/medical concerns Toileting steps completed by patient: Adjust clothing prior to toileting, Performs perineal hygiene, Adjust clothing after toileting Toileting steps completed by helper: Adjust clothing prior to toileting, Adjust clothing after toileting Toileting Assistive Devices: Grab bar or rail  Toileting assist Assist level: Supervision or verbal cues   Transfers Chair/bed transfer Chair/bed transfer activity did not occur: N/A Chair/bed transfer method: Stand pivot Chair/bed transfer assist level: Supervision or verbal cues Chair/bed transfer assistive device: Armrests, Medical sales representative     Max distance:  175 Assist level: Supervision or verbal cues   Wheelchair   Type: Manual Max wheelchair distance: 100 Assist Level: Supervision or verbal cues  Cognition Comprehension Comprehension assist level: Follows complex conversation/direction with no assist  Expression Expression assist level: Expresses complex ideas: With no assist  Social Interaction Social Interaction assist level: Interacts appropriately with others - No medications needed.  Problem Solving Problem solving assist level: Solves basic problems with no assist  Memory Memory assist level: Recognizes or recalls 90% of the time/requires cueing < 10% of the time   Medical Problem List and Plan: 1. Decreased functional mobility with balance deficits processing deficits as well as apraxiasecondary to small right cortical brain  infarct/?seizure, in addition pt has multiple bilateral small vessel infarcts and microhemorhages which probably explain her decline over the last year Cont CIR, PT, OT, has cognitive deficits as well due to multi subcortical infarcts 2. DVT Prophylaxis/Anticoagulation: Subcutaneous Lovenox. Monitor platelet counts and any signs of bleeding 3. Pain Management: Tylenol as needed 4. Mood: Zoloft 100mg  daily 5. Neuropsych: This patient iscapable of making decisions on herown behalf. 6. Skin/Wound Care: contact rash to back  -steroid cream/sarna/double sheets 7. Fluids/Electrolytes/Nutrition: I personally reviewed the patient's labs today.   -all near normal 8.Seizure prophylaxis. Keppra-XR 500 mg daily 9..Diabetes mellitus with peripheral neuropathy. Hemoglobin A1c 7.1. Patient on Glucotrol 10 mg twice a day, Glucophage 1000 mg twice a day prior to admission.   -resume glucotrol at 5mg  bid- CBG ok cont current dose CBG (last 3)   Recent Labs  05/03/16 1648 05/03/16 2039 05/04/16 0631  GLUCAP 105* 162* 122*    11.Hypertension. No current antihypertensive medication. Patient on Norvasc 5 mg daily prior to admission.   -restarted 12/4 , BPs improved Vitals:   05/03/16 1331 05/04/16 0444  BP: 131/81 (!) 144/69  Pulse: 68 65  Resp: 18 18  Temp: 98.3 F (36.8 C) 98.2 F (36.8 C)   12.Hypothyroidism. Synthroid 13.Hyperlipidemia. Lipitor 14.  Spastic bladder, post CVA, neg UA, anticholinergic discussed but pt concerned that she is on too many meds 15.  Constipation improved with sorbitol, add colace for prophyllaxis LOS (Days) 9 A FACE TO FACE EVALUATION WAS PERFORMED  KIRSTEINS,ANDREW E 05/04/2016 7:02 AM

## 2016-05-04 NOTE — Progress Notes (Signed)
Physical Therapy Weekly Progress Note  Patient Details  Name: Amy [name] MRN: [id number] Date of Birth: 29-Mar-1930  Beginning of progress report period: April 26, 2016 End of progress report period: May 03, 2016  Today's Date: 05/03/2016  Patient has met 1 of 1 short term goals.  Patient continues to demonstrate cognitive deficits that limit safety with gait, and functional tasks due to inability to multitask. Supervision assist for gait and, transfers, is required with and without AD. Occassionally, patient demonstrates neear LOB without AD requiring min assist to prevent fall.   Patient continues to demonstrate the following deficits muscle weakness, decreased problem solving, decreased memory and delayed processing and decreased standing balance and decreased balance strategies and therefore will continue to benefit from skilled PT intervention to increase functional independence with mobility.  Patient progressing toward long term goals..  Continue plan of care.  PT Short Term Goals Week 1:  PT Short Term Goal 1 (Week 1): STG = LTG due to ELOS Week 2:    STG = LTG due to estimated D/c date.    Therapy Documentation Precautions:  Precautions Precautions: Fall Precaution Comments: last fall in her yard Restrictions Weight Bearing Restrictions: No General:   Vital Signs: Therapy Vitals Temp: 98.2 F (36.8 C) Temp Source: Oral Pulse Rate: 65 Resp: 18 BP: (!) 144/69 Patient Position (if appropriate): Lying Oxygen Therapy SpO2: 93 % O2 Device: Not Delivered   See Function Navigator for Current Functional Status.  Therapy/Group: Individual Therapy  Lorie Phenix 05/04/2016, 5:46 AM

## 2016-05-05 ENCOUNTER — Inpatient Hospital Stay (HOSPITAL_COMMUNITY): Payer: Medicare Other | Admitting: Occupational Therapy

## 2016-05-05 ENCOUNTER — Inpatient Hospital Stay (HOSPITAL_COMMUNITY): Payer: Medicare Other

## 2016-05-05 ENCOUNTER — Inpatient Hospital Stay (HOSPITAL_COMMUNITY): Payer: Medicare Other | Admitting: Speech Pathology

## 2016-05-05 LAB — GLUCOSE, CAPILLARY
GLUCOSE-CAPILLARY: 132 mg/dL — AB (ref 65–99)
GLUCOSE-CAPILLARY: 110 mg/dL — AB (ref 65–99)
Glucose-Capillary: 127 mg/dL — ABNORMAL HIGH (ref 65–99)
Glucose-Capillary: 179 mg/dL — ABNORMAL HIGH (ref 65–99)

## 2016-05-05 NOTE — Progress Notes (Signed)
Occupational Therapy Session Note  Patient Details  Name: LAURANA BATTEY MRN: [id number] Date of Birth: [birth year]  Today's Date: 05/05/2016 OT Individual Time: [id number] OT Individual Time Calculation (min): 40 min     Skilled Therapeutic Interventions/Progress Updates:    Pt completed functional mobility to and from the ADL apartment with use of the RW and supervision.  Educated pt on use of the RW in the kitchen for transporting items using the counter tops as well as discussion of a walker bag. .  Because she is going to ALF at discharge, and it is new, she does not know how things will be setup.  Finished session by having her transfer items throughout the kitchen using the RW for safety.  Pt needs supervision for safety with this task as she would at times attempt to take steps while holding the walker with one hand and the object she was attempting to move down the countertop with the other at times.  Returned to room at end of session with pt requesting to sit in the bedside chair.  Call button and phone in reach.     Therapy Documentation Precautions:  Precautions Precautions: Fall Precaution Comments: last fall in her yard Restrictions Weight Bearing Restrictions: No  Pain: Pain Assessment Pain Assessment: No/denies pain ADL: See Function Navigator for Current Functional Status.   Therapy/Group: Individual Therapy  Saraiya Kozma OTR/L 05/05/2016, 3:55 PM

## 2016-05-05 NOTE — Progress Notes (Signed)
Occupational Therapy Session Note  Patient Details  Name: Amy [name] MRN: [id number] Date of Birth: 08-08-1929  Today's Date: 05/05/2016 OT Individual Time: 5449-2010 OT Individual Time Calculation (min): 55 min     Short Term Goals: No short term goals set  Skilled Therapeutic Interventions/Progress Updates:    Pt seen this session to challenge dynamic balance, safety awareness, memory during ADL retraining. Pt continues to demonstrate progress with balance as she is stable standing in the shower.  She continues to need reminders to use the RW as she will often step out of the bathroom without it.  She only needs Supervision with all of her self care with occasional cues for safety, such as "sit down on chair to dry feet well before coming out of the bathroom.  She did recall going to the gift shop over the weekend with therapy and discussed plans for her to have S at her ALF.  Pt in room resting in recliner with all needs met. RN with pt and will set chair alarm.    Therapy Documentation Precautions:  Precautions Precautions: Fall Precaution Comments: last fall in her yard Restrictions Weight Bearing Restrictions: No    Vital Signs: Therapy Vitals Temp: 98.1 F (36.7 C) Temp Source: Oral Pulse Rate: 66 Resp: 18 BP: (!) 150/61 Patient Position (if appropriate): Sitting Oxygen Therapy SpO2: 97 % O2 Device: Not Delivered Pain: Pain Assessment Pain Assessment: No/denies pain ADL: ADL ADL Comments: see Functional Assessment Tool  See Function Navigator for Current Functional Status.   Therapy/Group: Individual Therapy  SAGUIER,JULIA 05/05/2016, 9:38 AM

## 2016-05-05 NOTE — Progress Notes (Signed)
[facility] PHYSICAL MEDICINE & REHABILITATION     PROGRESS NOTE    Subjective/Complaints: Up at EOB. No complaints. In good spirits  ROS: pt denies nausea, vomiting, diarrhea, cough, shortness of breath or chest pain    Objective: Vital Signs: Blood pressure (!) 150/61, pulse 66, temperature 98.1 F (36.7 C), temperature source Oral, resp. rate 18, height 5\' 7"  (1.702 m), weight 88.5 kg (195 lb), SpO2 97 %. No results found. No results for input(s): WBC, HGB, HCT, PLT in the last 72 hours. No results for input(s): NA, K, CL, GLUCOSE, BUN, CREATININE, CALCIUM in the last 72 hours.  Invalid input(s): CO CBG (last 3)   Recent Labs  05/04/16 1623 05/04/16 2033 05/05/16 0642  GLUCAP 137* 106* 132*    Wt Readings from Last 3 Encounters:  05/02/16 88.5 kg (195 lb)  04/22/16 74.8 kg (165 lb)  12/19/15 86.2 kg (190 lb)    Physical Exam:  Constitutional: She appears well-developedand well-nourished.  HENT:  Head: Normocephalic.  Right Ear: External earnormal.  Left Ear: External earnormal.  Eyes: EOMare normal. Pupils are equal, round, and reactive to light.  Pupils round and reactive to light Neck: Normal range of motion. Neck supple. No tracheal deviationpresent. No thyromegalypresent.  Cardiovascular: RRR Respiratory: clear without wheezes.  GI: Soft. Bowel sounds are normal. She exhibits no distension. There is no tenderness. There is no rebound.  Musculoskeletal: She exhibits no edema.  Neurological: She is alert.  Alert. Oriented to self, place, month.  Mild left inattention improving.  . Remains somewhatApraxic. Seems to sense pain in all 4. Motor 4/5 to 5/5 in all 4 limbs proximal to distal. .senses pain in all 4 limbs. DTR's are 1+.  Skin: intact  Psychiatric: pleasnt and cooperative  Assessment/Plan: 1. Functional and mobility deficits secondary to small right cortical brain infarct with ?seizure which require 3+ hours per day of interdisciplinary  therapy in a comprehensive inpatient rehab setting. Physiatrist is providing close team supervision and 24 hour management of active medical problems listed below. Physiatrist and rehab team continue to assess barriers to discharge/monitor patient progress toward functional and medical goals.  Function:  Bathing Bathing position   Position: Shower  Bathing parts Body parts bathed by patient: Right arm, Left arm, Chest, Abdomen, Front perineal area, Right upper leg, Left upper leg, Buttocks, Right lower leg, Left lower leg Body parts bathed by helper: Back  Bathing assist Assist Level: Supervision or verbal cues      Upper Body Dressing/Undressing Upper body dressing   What is the patient wearing?: Bra, Pull over shirt/dress Bra - Perfomed by patient: Thread/unthread right bra strap, Thread/unthread left bra strap, Hook/unhook bra (pull down sports bra) Bra - Perfomed by helper: Hook/unhook bra (pull down sports bra) Pull over shirt/dress - Perfomed by patient: Thread/unthread right sleeve, Thread/unthread left sleeve, Put head through opening, Pull shirt over trunk Pull over shirt/dress - Perfomed by helper: Put head through opening, Pull shirt over trunk        Upper body assist Assist Level: Set up      Lower Body Dressing/Undressing Lower body dressing   What is the patient wearing?: Underwear, Pants, [employer], Shoes Underwear - Performed by patient: Thread/unthread right underwear leg, Thread/unthread left underwear leg, Pull underwear up/down Underwear - Performed by helper: Thread/unthread right underwear leg, Thread/unthread left underwear leg, Pull underwear up/down Pants- Performed by patient: Thread/unthread right pants leg, Thread/unthread left pants leg, Pull pants up/down Pants- Performed by helper: Pull pants up/down,  Fasten/unfasten pants   Non-skid slipper socks- Performed by helper: Don/doff right sock, Don/doff left sock     Shoes - Performed by patient:  Don/doff right shoe, Don/doff left shoe, Fasten right, Fasten left         TED Hose - Performed by helper: Don/doff right TED hose, Don/doff left TED hose  Lower body assist Assist for lower body dressing: Supervision or verbal cues      Toileting Toileting Toileting activity did not occur: Safety/medical concerns Toileting steps completed by patient: Adjust clothing prior to toileting, Performs perineal hygiene, Adjust clothing after toileting Toileting steps completed by helper: Adjust clothing prior to toileting, Adjust clothing after toileting Toileting Assistive Devices: Grab bar or rail  Toileting assist Assist level: Supervision or verbal cues   Transfers Chair/bed transfer Chair/bed transfer activity did not occur: N/A Chair/bed transfer method: Stand pivot Chair/bed transfer assist level: Supervision or verbal cues Chair/bed transfer assistive device: Armrests, Medical sales representative     Max distance:  175 Assist level: Supervision or verbal cues   Wheelchair   Type: Manual Max wheelchair distance: 100 Assist Level: Supervision or verbal cues  Cognition Comprehension Comprehension assist level: Follows complex conversation/direction with no assist  Expression Expression assist level: Expresses complex ideas: With no assist  Social Interaction Social Interaction assist level: Interacts appropriately with others - No medications needed.  Problem Solving Problem solving assist level: Solves basic problems with no assist  Memory Memory assist level: Recognizes or recalls 90% of the time/requires cueing < 10% of the time   Medical Problem List and Plan: 1. Decreased functional mobility with balance deficits processing deficits as well as apraxiasecondary to small right cortical brain infarct/?seizure, in addition pt has multiple bilateral small vessel infarcts and microhemorhages which probably explain her decline over the last year Cont CIR, PT, OT,  has cognitive deficits as well due to multi subcortical infarcts  -working toward [place] at [employer] 2. DVT Prophylaxis/Anticoagulation: Subcutaneous Lovenox. Monitor platelet counts and any signs of bleeding 3. Pain Management: Tylenol as needed 4. Mood: Zoloft 100mg  daily 5. Neuropsych: This patient iscapable of making decisions on herown behalf. 6. Skin/Wound Care: contact rash to back  -steroid cream/sarna/double sheets 7. Fluids/Electrolytes/Nutrition: I personally reviewed the patient's labs today.   -all near normal 8.Seizure prophylaxis. Keppra-XR 500 mg daily 9..Diabetes mellitus with peripheral neuropathy. Hemoglobin A1c 7.1. Patient on Glucotrol 10 mg twice a day, Glucophage 1000 mg twice a day prior to admission.   -resumed glucotrol at 5mg  bid- CBG's reasonable-- cont current dose CBG (last 3)   Recent Labs  05/04/16 1623 05/04/16 2033 05/05/16 0642  GLUCAP 137* 106* 132*    11.Hypertension. No current antihypertensive medication. Patient on Norvasc 5 mg daily prior to admission.   -restarted 12/4 , BPs improved Vitals:   05/04/16 1350 05/05/16 0619  BP: (!) 144/58 (!) 150/61  Pulse: 64 66  Resp: 18 18  Temp: 97.9 F (36.6 C) 98.1 F (36.7 C)   12.Hypothyroidism. Synthroid 13.Hyperlipidemia. Lipitor 14.  Spastic bladder, post CVA, neg UA, anticholinergic discussed but pt concerned that she is on too many meds. Frequency a little better late yesterday 46.  Constipation improved with sorbitol, added colace for prophyllaxis  -one loose bm yesterday    LOS (Days) 10 A FACE TO FACE EVALUATION WAS PERFORMED  SWARTZ,ZACHARY T 05/05/2016 8:33 AM

## 2016-05-05 NOTE — Progress Notes (Signed)
Social Work Patient ID: Amy [name], female   DOB: 08/31/1929, 82 y.o.   MRN: [id number]  Met with pt and spoke with [name]-daughter to see where we are in the plans. [facility] has the room and daughter has placed a deposit down and getting it ready-painted and furniture moved in. Moving toward Wed moving into the independent living room with additional services. Pt is on board and feels doing better each day here. Working on discharge.  

## 2016-05-05 NOTE — Progress Notes (Signed)
Physical Therapy Note  Patient Details  Name: AADYA VANESS MRN: [id number] Date of Birth: [birth year] Today's Date: 05/05/2016  1400-1545, 45 min individual tx Pain: none reported  Discussed d/c plans.  Pt sad that she will not have any garden space at [facility]; PT provided support.  Gait from room to gym over level tile, without AD, at pt's request.  L foot scuffs floor but clears, q step.  During turn L to sit on NUStep, pt had LOB forward and to L requiring mod assist.  Pt stated "I wonder why that happens?", indicating poor awareness of deficits in functional context.  neuromuscular re-education via forced use for alternating reciprocal movement x 4 extremities, at level at level 4 x 5 minutes.     Lamees Gable 05/05/2016, 2:36 PM

## 2016-05-05 NOTE — Progress Notes (Signed)
Speech Language Pathology Daily Session Note  Patient Details  Name: Amy [name] MRN: [id number] Date of Birth: 11-Jan-1930  Today's Date: 05/05/2016 SLP Individual Time: 0950-1030 SLP Individual Time Calculation (min): 40 min   Short Term Goals: Week 2: SLP Short Term Goal 1 (Week 2): Pt will demonstrate sustained attention to functional tasks for ~5 minutes with Min A verbal cues for redirection.  SLP Short Term Goal 2 (Week 2): Pt will demonstrate intellectual awareness by stating 1 cognitive deficits related to current CVA with Min A verbal cues.  SLP Short Term Goal 3 (Week 2): Pt will demonstrate functional problem solving for mildly complex tasks with Min A verbal cues.  SLP Short Term Goal 4 (Week 2): Pt will recall and follow 3 step directions with 90% accuracy and Min A verbal cues.   Skilled Therapeutic Interventions: Pt was seen for skilled ST targeting cognitive goals.  SLP facilitated the session with a previously taught card game to address recall of new information.  Pt needed min assist verbal cues to recall rules and procedures of task from previous therapy session.  She was able to generate and recall new word-picture associations with supervision question cues.  Furthermore, she was able to recall 100% of category members within task with mod I.  Pt needed min assist for anticipatory awareness of how her current deficits will impact her safety in an independent living facility given that team continues to recommend 24/7 supervision.  Pt was left in recliner with call bell within reach.  Continue per current plan of care.      Function:  Eating Eating                 Cognition Comprehension Comprehension assist level: Understands complex 90% of the time/cues 10% of the time  Expression   Expression assist level: Expresses complex 90% of the time/cues < 10% of the time  Social Interaction Social Interaction assist level: Interacts appropriately with others with  medication or extra time (anti-anxiety, antidepressant).  Problem Solving Problem solving assist level: Solves basic 75 - 89% of the time/requires cueing 10 - 24% of the time  [name] [name] assist level: Recognizes or recalls 75 - 89% of the time/requires cueing 10 - 24% of the time    Pain Pain Assessment Pain Assessment: No/denies pain  Therapy/Group: Individual Therapy  Amy [name], Amy [name] 05/05/2016, 4:17 PM

## 2016-05-06 ENCOUNTER — Inpatient Hospital Stay (HOSPITAL_COMMUNITY): Payer: Medicare Other

## 2016-05-06 ENCOUNTER — Inpatient Hospital Stay (HOSPITAL_COMMUNITY): Payer: Medicare Other | Admitting: Physical Therapy

## 2016-05-06 ENCOUNTER — Inpatient Hospital Stay (HOSPITAL_COMMUNITY): Payer: Medicare Other | Admitting: Occupational Therapy

## 2016-05-06 ENCOUNTER — Inpatient Hospital Stay (HOSPITAL_COMMUNITY): Payer: Medicare Other | Admitting: Speech Pathology

## 2016-05-06 LAB — GLUCOSE, CAPILLARY
GLUCOSE-CAPILLARY: 124 mg/dL — AB (ref 65–99)
GLUCOSE-CAPILLARY: 81 mg/dL (ref 65–99)
Glucose-Capillary: 145 mg/dL — ABNORMAL HIGH (ref 65–99)
Glucose-Capillary: 118 mg/dL — ABNORMAL HIGH (ref 65–99)

## 2016-05-06 NOTE — Discharge Summary (Signed)
Discharge summary job # [id number]

## 2016-05-06 NOTE — Progress Notes (Signed)
Physical Therapy Note  Patient Details  Name: Amy [name] MRN: [id number] Date of Birth: 10-Jan-1930 Today's Date: 05/06/2016    Time: 1000-1045 45 minutes  1:1 No c/o pain. Pt fatigued from previous speech and OT sessions but willing to do "as much as I can".  Pt performed w/c mobility throughout unit up to 150' with supervision with bilat LEs.  Car transfer to simulated SUV height with supervision, cues for safety.  Gait with RW in controlled and home environments with supervision.  Gait on uneven surfaces with cues for safety and supervision, no LOB.  Stair negotiation x 12 stairs with bilat handrails with supervision.  Nustep for LE/UE strengthening x 10 minutes level 5. Pt reports she is going to try her best to go slowly and be aware of her safety when she d/cs tomorrow.   DONAWERTH,KAREN 05/06/2016, 10:50 AM

## 2016-05-06 NOTE — Progress Notes (Signed)
Speech Language Pathology Daily Session Note  Patient Details  Name: Amy [name] MRN: [id number] Date of Birth: May 13, 1930  Today's Date: 05/06/2016 SLP Individual Time: 0830-0905 SLP Individual Time Calculation (min): 35 min   Short Term Goals: Week 2: SLP Short Term Goal 1 (Week 2): Pt will demonstrate sustained attention to functional tasks for ~5 minutes with Min A verbal cues for redirection.  SLP Short Term Goal 2 (Week 2): Pt will demonstrate intellectual awareness by stating 1 cognitive deficits related to current CVA with Min A verbal cues.  SLP Short Term Goal 3 (Week 2): Pt will demonstrate functional problem solving for mildly complex tasks with Min A verbal cues.  SLP Short Term Goal 4 (Week 2): Pt will recall and follow 3 step directions with 90% accuracy and Min A verbal cues.   Skilled Therapeutic Interventions:   Skilled treatment session focused on addressing cognition goals and education for home management.  SLP facilitated session by providing handout and discussion regarding recommended home management strategies for recall and problem solving for home management.  Patient required Supervision level question cues to anticipate how she could implement strategies upon discharge.  Left patient with handout for recall and carryover.  Education with patient complete.     Function:  Cognition Comprehension Comprehension assist level: Understands complex 90% of the time/cues 10% of the time  Expression   Expression assist level: Expresses complex 90% of the time/cues < 10% of the time  Social Interaction Social Interaction assist level: Interacts appropriately with others with medication or extra time (anti-anxiety, antidepressant).  Problem Solving Problem solving assist level: Solves basic 90% of the time/requires cueing < 10% of the time  Memory Memory assist level: Recognizes or recalls 90% of the time/requires cueing < 10% of the time    Pain Pain  Assessment Pain Assessment: No/denies pain  Therapy/Group: Individual Therapy  Carmelia Roller., CCC-SLP [id number]  [place] 05/06/2016, 9:31 AM

## 2016-05-06 NOTE — Progress Notes (Signed)
Occupational Therapy Discharge Summary  Patient Details  Name: Amy [name] MRN: [id number] Date of Birth: 1930-01-11  Today's Date: 05/06/2016 OT Individual Time: 0900-1000 OT Individual Time Calculation (min): 60 min   1:1 Grad day! Self care retraining at shower level with focus on working memory, sequencing and organization of tasks, functional mobility with RW and dynamic standing balance. Continued education and discussion about safety in new [facility] apartment and recommendation to continue to use RW. Pt able to complete all functional tasks at goal level of Mod I to supervision    Patient has met 11 of 11 long term goals due to improved activity tolerance, improved balance, postural control, ability to compensate for deficits, improved attention, improved awareness and improved coordination.  Patient to discharge at overall mod I to supervision with RW level at ALF level  Reasons goals not met: n/a  Recommendation:  Patient will benefit from ongoing skilled OT services in home health setting to continue to advance functional skills in the area of BADL, iADL and Reduce care partner burden.  Equipment: No equipment provided  Reasons for discharge: treatment goals met and discharge from hospital  Patient/family agrees with progress made and goals achieved: Yes  OT Discharge Precautions/Restrictions Precautions Precaution Comments: decr memory and safety awareness Restrictions Weight Bearing Restrictions: No Pain Pain Assessment Pain Assessment: No/denies pain ADL ADL ADL Comments: see functional navigator Vision/Perception  Vision- History Baseline Vision/History: Wears glasses Wears Glasses: Reading only Patient Visual Report: No change from baseline Vision- Assessment Vision Assessment?: Yes Eye Alignment: Within Functional Limits Ocular Range of Motion: Within Functional Limits Alignment/Gaze Preference: Within Defined Limits Tracking/Visual Pursuits: Able to  track stimulus in all quads without difficulty Praxis Praxis-Other Comments: improved motor planning  Cognition Arousal/Alertness: Awake/alert Orientation Level: Oriented X4 Attention: Sustained Sustained Attention: Appears intact Selective Attention: Impaired Selective Attention Impairment: Functional basic Memory Impairment: Storage deficit;Retrieval deficit;Prospective memory;Decreased short term memory;Decreased recall of new information Awareness Impairment: Anticipatory impairment Sequencing: Appears intact Organizing: Appears intact Initiating: Appears intact Self Monitoring: Appears intact Sensation Sensation Light Touch: Appears Intact Stereognosis: Appears Intact Hot/Cold: Appears Intact Proprioception: Appears Intact Coordination Gross Motor Movements are Fluid and Coordinated: Yes Fine Motor Movements are Fluid and Coordinated: Yes Motor  Motor Motor - Discharge Observations: improved overall  Mobility  Transfers Sit to Stand: 6: Modified independent (Device/Increase time)  Trunk/Postural Assessment  Cervical Assessment Cervical Assessment: Within Functional Limits Thoracic Assessment Thoracic Assessment: Within Functional Limits Lumbar Assessment Lumbar Assessment: Within Functional Limits Postural Control Postural Control: Within Functional Limits Righting Reactions: improved  Balance Static Sitting Balance Static Sitting - Level of Assistance: 6: Modified independent (Device/Increase time) Dynamic Sitting Balance Dynamic Sitting - Level of Assistance: 6: Modified independent (Device/Increase time) Static Standing Balance Static Standing - Level of Assistance: 6: Modified independent (Device/Increase time) Dynamic Standing Balance Dynamic Standing - Level of Assistance: 5: Stand by assistance Extremity/Trunk Assessment RUE Assessment RUE Assessment: Within Functional Limits LUE Assessment LUE Assessment: Within Functional Limits   See Function  Navigator for Current Functional Status.  Willeen Cass [facility] 05/06/2016, 9:57 AM

## 2016-05-06 NOTE — Progress Notes (Signed)
[place] PHYSICAL MEDICINE & REHABILITATION     PROGRESS NOTE    Subjective/Complaints: Appreciate SWnote Pt still with freq of urination  ROS: pt denies nausea, vomiting, diarrhea, cough, shortness of breath or chest pain    Objective: Vital Signs: Blood pressure (!) 139/55, pulse 60, temperature 98.1 F (36.7 C), temperature source Oral, resp. rate 17, height 5\' 7"  (1.702 m), weight 88.5 kg (195 lb), SpO2 97 %. No results found. No results for input(s): WBC, HGB, HCT, PLT in the last 72 hours. No results for input(s): NA, K, CL, GLUCOSE, BUN, CREATININE, CALCIUM in the last 72 hours.  Invalid input(s): CO CBG (last 3)   Recent Labs  05/05/16 1650 05/05/16 2044 05/06/16 0624  GLUCAP 110* 179* 124*    Wt Readings from Last 3 Encounters:  05/02/16 88.5 kg (195 lb)  04/22/16 74.8 kg (165 lb)  12/19/15 86.2 kg (190 lb)    Physical Exam:  Constitutional: She appears well-developedand well-nourished.  HENT:  Head: Normocephalic.  Right Ear: External earnormal.  Left Ear: External earnormal.  Eyes: EOMare normal. Pupils are equal, round, and reactive to light.  Pupils round and reactive to light Neck: Normal range of motion. Neck supple. No tracheal deviationpresent. No thyromegalypresent.  Cardiovascular: RRR Respiratory: clear without wheezes.  GI: Soft. Bowel sounds are normal. She exhibits no distension. There is no tenderness. There is no rebound.  Musculoskeletal: She exhibits no edema.  Neurological: She is alert.  Alert. Oriented to self, place, month.  Mild left inattention improving.  . Remains somewhatApraxic. Seems to sense pain in all 4. Motor 4/5 to 5/5 in all 4 limbs proximal to distal. .senses pain in all 4 limbs. DTR's are 1+.  Skin: intact  Psychiatric: pleasnt and cooperative  Assessment/Plan: 1. 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 Physiatrist is providing close team supervision and 24 hour management of active medical problems listed below. Physiatrist and rehab team continue to assess barriers to discharge/monitor patient progress toward functional and medical goals.  Function:  Bathing Bathing position   Position: Shower  Bathing parts Body parts bathed by patient: Right arm, Left arm, Chest, Abdomen, Front perineal area, Right upper leg, Left upper leg, Buttocks, Right lower leg, Left lower leg Body parts bathed by helper: Back  Bathing assist Assist Level: Supervision or verbal cues (standing 100% of the time)      Upper Body Dressing/Undressing Upper body dressing   What is the patient wearing?: Bra, Pull over shirt/dress Bra - Perfomed by patient: Thread/unthread right bra strap, Thread/unthread left bra strap, Hook/unhook bra (pull down sports bra) Bra - Perfomed by helper: Hook/unhook bra (pull down sports bra) Pull over shirt/dress - Perfomed by patient: Thread/unthread right sleeve, Thread/unthread left sleeve, Put head through opening, Pull shirt over trunk Pull over shirt/dress - Perfomed by helper: Put head through opening, Pull shirt over trunk        Upper body assist Assist Level: Set up      Lower Body Dressing/Undressing Lower body dressing   What is the patient wearing?: Underwear, Pants, [employer], Shoes Underwear - Performed by patient: Thread/unthread right underwear leg, Thread/unthread left underwear leg, Pull underwear up/down Underwear - Performed by helper: Thread/unthread right underwear leg, Thread/unthread left underwear leg, Pull underwear up/down Pants- Performed by patient: Thread/unthread right pants leg, Thread/unthread left pants leg, Pull pants up/down Pants- Performed  by helper: Pull pants up/down, Fasten/unfasten pants   Non-skid slipper socks- Performed by helper: Don/doff right sock, Don/doff left  sock     Shoes - Performed by patient: Don/doff right shoe, Don/doff left shoe, Fasten right, Fasten left         TED Hose - Performed by helper: Don/doff right TED hose, Don/doff left TED hose  Lower body assist Assist for lower body dressing: Supervision or verbal cues      Toileting Toileting Toileting activity did not occur: Safety/medical concerns Toileting steps completed by patient: Adjust clothing prior to toileting, Performs perineal hygiene, Adjust clothing after toileting Toileting steps completed by helper: Adjust clothing prior to toileting, Adjust clothing after toileting Toileting Assistive Devices: Grab bar or rail  Toileting assist Assist level: Supervision or verbal cues   Transfers Chair/bed transfer Chair/bed transfer activity did not occur: N/A Chair/bed transfer method: Stand pivot Chair/bed transfer assist level: Supervision or verbal cues Chair/bed transfer assistive device: Armrests, Medical sales representative     Max distance: 150 Assist level: Moderate assist (Pt 50 - 74%)   Wheelchair   Type: Manual Max wheelchair distance: 100 Assist Level: Supervision or verbal cues  Cognition Comprehension Comprehension assist level: Understands complex 90% of the time/cues 10% of the time  Expression Expression assist level: Expresses complex 90% of the time/cues < 10% of the time  Social Interaction Social Interaction assist level: Interacts appropriately with others with medication or extra time (anti-anxiety, antidepressant).  Problem Solving Problem solving assist level: Solves basic 25 - 49% of the time - needs direction more than half the time to initiate, plan or complete simple activities  Memory Memory assist level: Recognizes or recalls 75 - 89% of the time/requires cueing 10 - 24% of the time   Medical Problem List and Plan: 1. Decreased functional mobility with balance deficits processing deficits as well as apraxiasecondary to small  right cortical brain infarct/?seizure, in addition pt has multiple bilateral small vessel infarcts and microhemorhages which probably explain her decline over the last year Cont CIR, PT, OT, has cognitive deficits as well due to multi subcortical infarcts  -Plan D/C in am to ILF at [facility] with added services 2. DVT Prophylaxis/Anticoagulation: Subcutaneous Lovenox. Monitor platelet counts and any signs of bleeding 3. Pain Management: Tylenol as needed 4. Mood: Zoloft 100mg  daily 5. Neuropsych: This patient iscapable of making decisions on herown behalf. 6. Skin/Wound Care: contact rash to back  -steroid cream/sarna/double sheets 7. Fluids/Electrolytes/Nutrition: I personally reviewed the patient's labs today.   -all near normal 8.Seizure prophylaxis. Keppra-XR 500 mg daily 9..Diabetes mellitus with peripheral neuropathy. Hemoglobin A1c 7.1. Patient on Glucotrol 10 mg twice a day, Glucophage 1000 mg twice a day prior to admission.   -resumed glucotrol at 5mg  bid- CBG's reasonable-- cont current dose CBG (last 3)   Recent Labs  05/05/16 1650 05/05/16 2044 05/06/16 0624  GLUCAP 110* 179* 124*    11.Hypertension. No current antihypertensive medication. Patient on Norvasc 5 mg daily prior to admission.   -restarted 12/4 , BPs improved Vitals:   05/05/16 1332 05/06/16 0547  BP: 138/71 (!) 139/55  Pulse: 62 60  Resp: 17 17  Temp: 98.1 F (36.7 C) 98.1 F (36.7 C)   12.Hypothyroidism. Synthroid 13.Hyperlipidemia. Lipitor 14.  Spastic bladder, post CVA, neg UA, anticholinergic discussed but pt concerned that she is on too many meds.  15.  Constipation improved with sorbitol, colace for prophyllaxis, pt reports good BMs  LOS (Days) 11 A FACE TO FACE EVALUATION WAS PERFORMED  Charlett Blake 05/06/2016 6:48 AM

## 2016-05-06 NOTE — Progress Notes (Signed)
Physical Therapy Discharge Summary  Patient Details  Name: Amy [name] MRN: [id number] Date of Birth: June 07, 1929   Patient has met 9 of 9 long term goals due to improved activity tolerance, improved balance, improved postural control, increased strength, decreased pain, ability to compensate for deficits and improved awareness.  Patient to discharge at an ambulatory level Supervision.   Patient to d/c to [facility].  Reasons goals not met: n/a  Recommendation:  Patient will benefit from ongoing skilled PT services in home health setting to continue to advance safe functional mobility, address ongoing impairments in balance, gait, strength, and minimize fall risk.  Equipment: RW  Reasons for discharge: treatment goals met and discharge from hospital  Patient/family agrees with progress made and goals achieved: Yes  PT Discharge Precautions/Restrictions Precautions Precautions: Fall Precaution Comments: decr memory and safety awareness Restrictions Weight Bearing Restrictions: No Pain Pain Assessment Pain Assessment: No/denies pain  Cognition Arousal/Alertness: Awake/alert Orientation Level: Oriented X4 Attention: Sustained Sustained Attention: Appears intact Selective Attention: Impaired Selective Attention Impairment: Functional basic Memory: Impaired Memory Impairment: Storage deficit;Retrieval deficit;Prospective memory;Decreased short term memory;Decreased recall of new information Awareness: Impaired Awareness Impairment: Anticipatory impairment Sequencing: Appears intact Organizing: Appears intact Initiating: Appears intact Self Monitoring: Appears intact Safety/Judgment: Impaired Sensation Sensation Light Touch: Appears Intact Stereognosis: Appears Intact Hot/Cold: Appears Intact Proprioception: Appears Intact Coordination Gross Motor Movements are Fluid and Coordinated: Yes Fine Motor Movements are Fluid and Coordinated: Yes Motor   Motor Motor: Within Functional Limits Motor - Skilled Clinical Observations: Generalized weakness Motor - Discharge Observations: improved overall    Trunk/Postural Assessment  Cervical Assessment Cervical Assessment: Within Functional Limits Thoracic Assessment Thoracic Assessment: Within Functional Limits Lumbar Assessment Lumbar Assessment: Within Functional Limits Postural Control Postural Control: Within Functional Limits Righting Reactions: improved  Balance Static Sitting Balance Static Sitting - Level of Assistance: 6: Modified independent (Device/Increase time) Dynamic Sitting Balance Dynamic Sitting - Level of Assistance: 6: Modified independent (Device/Increase time) Static Standing Balance Static Standing - Level of Assistance: 6: Modified independent (Device/Increase time) Dynamic Standing Balance Dynamic Standing - Level of Assistance: 5: Stand by assistance  Standardized Balance Assessment Standardized Balance Assessment: Berg Balance Test Berg Balance Test Sit to Stand: Able to stand without using hands and stabilize independently Standing Unsupported: Able to stand safely 2 minutes Sitting with Back Unsupported but Feet Supported on Floor or Stool: Able to sit safely and securely 2 minutes Stand to Sit: Sits safely with minimal use of hands Transfers: Able to transfer safely, minor use of hands Standing Unsupported with Eyes Closed: Able to stand 10 seconds with supervision Standing Ubsupported with Feet Together: Able to place feet together independently but unable to hold for 30 seconds From Standing, Reach Forward with Outstretched Arm: Can reach forward >12 cm safely (5") From Standing Position, Pick up Object from Floor: Able to pick up shoe, needs supervision From Standing Position, Turn to Look Behind Over each Shoulder: Looks behind one side only/other side shows less weight shift Turn 360 Degrees: Needs close supervision or verbal cueing Standing  Unsupported, Alternately Place Feet on Step/Stool: Able to complete >2 steps/needs minimal assist Standing Unsupported, One Foot in Front: Loses balance while stepping or standing Standing on One Leg: Unable to try or needs assist to prevent fall Total Score: 36   Extremity Assessment  RUE Assessment RUE Assessment: Within Functional Limits LUE Assessment LUE Assessment: Within Functional Limits RLE Assessment RLE Assessment: Within Functional Limits LLE Assessment LLE Assessment: Within Functional Limits   See  Function Navigator for Current Functional Status.  DONAWERTH,KAREN 05/06/2016, 10:30 AM   Lars Masson, PT, DPT 3:23 PM

## 2016-05-06 NOTE — Progress Notes (Signed)
Speech Language Pathology Discharge Summary  Patient Details  Name: Amy [name] MRN: [id number] Date of Birth: 12/11/1929  Today's Date: 05/06/2016 SLP Individual Time: 1350-1430 SLP Individual Time Calculation (min): 40 min    Skilled Therapeutic Interventions:  Pt was seen for skilled ST targeting grad day activities.  SLP administered portions of the CLQT to measure progress from initial evaluation.  Pt needed mod assist for task organization during clock drawing task and min assist for error awareness during symbol cancellation subtest.  Pt scored within mild-moderate range of impairment for story retelling subtest and within normal limits for design recall subtests.  Discussed findings with pt and reviewed and reinforced recommendations that pt have assistance for medication and financial management at discharge.  Pt verbalized understanding.  Pt left in recliner with call bell within reach.  Pt discharging to independent living facility tomorrow.      Patient has met 4 of 4 long term goals.  Patient to discharge at overall Min level.  Reasons goals not met:     Clinical Impression/Discharge Summary:   Pt has made functional gains while inpatient and is discharging having met 4 out of 4 long term goals.  Pt is at an overall min assist level for semi-complex cognitive tasks due to moderate cognitive impairment.  Team is recommending 24/7 supervision at discharge but pt is transferring to independent living facility.  Pt education completed regarding cognitive compensatory strategies to maximize functional independence in new environment.  Therapist recommends that pt at least have assistance for medication and financial management at discharge.  Pt aware and reports that her family will be able to provide this.  Care Partner:  Caregiver Able to Provide Assistance:  (Independent living facility )  Type of Caregiver Assistance:  (independent living facility )  Recommendation:  24 hour  supervision/assistance  Rationale for SLP Follow Up: Maximize cognitive function and independence;Reduce caregiver burden   Equipment: none recommended by SLP    Reasons for discharge: Discharged from hospital   Patient/Family Agrees with Progress Made and Goals Achieved: Yes   Function:  Eating Eating                 Cognition Comprehension Comprehension assist level: Understands complex 90% of the time/cues 10% of the time  Expression   Expression assist level: Expresses complex 90% of the time/cues < 10% of the time  Social Interaction Social Interaction assist level: Interacts appropriately with others with medication or extra time (anti-anxiety, antidepressant).  Problem Solving Problem solving assist level: Solves basic 75 - 89% of the time/requires cueing 10 - 24% of the time  Memory Memory assist level: Recognizes or recalls 75 - 89% of the time/requires cueing 10 - 24% of the time   ,  L 05/06/2016, 4:24 PM    

## 2016-05-06 NOTE — Progress Notes (Signed)
Physical Therapy Session Note  Patient Details  Name: Amy [name] MRN: [id number] Date of Birth: 02/28/1930  Today's Date: 05/06/2016 PT Individual Time: 1500-1530 PT Individual Time Calculation (min): 30 min    Short Term Goals: Week 2:  PT Short Term Goal 1 (Week 2): STG = LTG due to d/c date.   Skilled Therapeutic Interventions/Progress Updates:    Session focused on functional gait on unit with RW and administered Berg Balance test to reassess fall risk. See results below. Discussed with patient and verbalized understanding of recommendation to use RW for all mobility. Pt's coordinator for assistance at [facility] present for parts of session and made aware of mobility recommendations for use of RW and balance deficits. Daughter also present during session intermittently.   Therapy Documentation Precautions:  Precautions Precautions: Fall Precaution Comments: decr memory and safety awareness Restrictions Weight Bearing Restrictions: No Pain:  Denies pain.    Balance: Standardized Balance Assessment Standardized Balance Assessment: Berg Balance Test Berg Balance Test Sit to Stand: Able to stand without using hands and stabilize independently Standing Unsupported: Able to stand safely 2 minutes Sitting with Back Unsupported but Feet Supported on Floor or Stool: Able to sit safely and securely 2 minutes Stand to Sit: Sits safely with minimal use of hands Transfers: Able to transfer safely, minor use of hands Standing Unsupported with Eyes Closed: Able to stand 10 seconds with supervision Standing Ubsupported with Feet Together: Able to place feet together independently but unable to hold for 30 seconds From Standing, Reach Forward with Outstretched Arm: Can reach forward >12 cm safely (5") From Standing Position, Pick up Object from Floor: Able to pick up shoe, needs supervision From Standing Position, Turn to Look Behind Over each Shoulder: Looks behind one side  only/other side shows less weight shift Turn 360 Degrees: Needs close supervision or verbal cueing Standing Unsupported, Alternately Place Feet on Step/Stool: Able to complete >2 steps/needs minimal assist Standing Unsupported, One Foot in Front: Loses balance while stepping or standing Standing on One Leg: Unable to try or needs assist to prevent fall Total Score: 36   See Function Navigator for Current Functional Status.   Therapy/Group: Individual Therapy  Canary Brim Ivory Broad, PT, DPT  05/06/2016, 3:42 PM

## 2016-05-06 NOTE — Discharge Instructions (Signed)
Inpatient Rehab Discharge Instructions  Amy [name] Discharge date and time: No discharge date for patient encounter.   Activities/Precautions/ Functional Status: Activity: activity as tolerated Diet: diabetic diet Wound Care: none needed Functional status:  ___ No restrictions     ___ Walk up steps independently ___ 24/7 supervision/assistance   ___ Walk up steps with assistance ___ Intermittent supervision/assistance  ___ Bathe/dress independently ___ Walk with walker     _x STROKE/TIA DISCHARGE INSTRUCTIONS SMOKING Cigarette smoking nearly doubles your risk of having a stroke & is the single most alterable risk factor  If you smoke or have smoked in the last 12 months, you are advised to quit smoking for your health.  Most of the excess cardiovascular risk related to smoking disappears within a year of stopping.  Ask you doctor about anti-smoking medications  [place] Quit Line: 1-800-QUIT NOW  Free Smoking Cessation Classes [phone]  CHOLESTEROL Know your levels; limit fat & cholesterol in your diet  Lipid Panel     Component Value Date/Time   CHOL 206 (H) 04/23/2016 0435   TRIG 135 04/23/2016 0435   TRIG 249 (HH) 03/11/2006 1510   HDL 65 04/23/2016 0435   CHOLHDL 3.2 04/23/2016 0435   VLDL 27 04/23/2016 0435   LDLCALC 114 (H) 04/23/2016 0435      Many patients benefit from treatment even if their cholesterol is at goal.  Goal: Total Cholesterol (CHOL) less than 160  Goal:  Triglycerides (TRIG) less than 150  Goal:  HDL greater than 40  Goal:  LDL (LDLCALC) less than 100   BLOOD PRESSURE American Stroke Association blood pressure target is less that 120/80 mm/Hg  Your discharge blood pressure is:  BP: (!) 150/61  Monitor your blood pressure  Limit your salt and alcohol intake  Many individuals will require more than one medication for high blood pressure  DIABETES (A1c is a blood sugar average for last 3 months) Goal HGBA1c is under 7% (HBGA1c is blood  sugar average for last 3 months)  Diabetes:    Lab Results  Component Value Date   HGBA1C 7.1 (H) 04/23/2016     Your HGBA1c can be lowered with medications, healthy diet, and exercise.  Check your blood sugar as directed by your physician  Call your physician if you experience unexplained or low blood sugars.  PHYSICAL ACTIVITY/REHABILITATION Goal is 30 minutes at least 4 days per week  Activity: Increase activity slowly, Therapies: Physical Therapy: Home Health Return to work:   Activity decreases your risk of heart attack and stroke and makes your heart stronger.  It helps control your weight and blood pressure; helps you relax and can improve your mood.  Participate in a regular exercise program.  Talk with your doctor about the best form of exercise for you (dancing, walking, swimming, cycling).  DIET/WEIGHT Goal is to maintain a healthy weight  Your discharge diet is: Diet Carb Modified Fluid consistency: Thin; Room service appropriate? Yes  liquids Your height is:  Height: 5\' 7"  (170.2 cm) Your current weight is: Weight: 88.5 kg (195 lb) Your Body Mass Index (BMI) is:  BMI (Calculated): 29.7  Following the type of diet specifically designed for you will help prevent another stroke.  Your goal weight range is:    Your goal Body Mass Index (BMI) is 19-24.  Healthy food habits can help reduce 3 risk factors for stroke:  High cholesterol, hypertension, and excess weight.  RESOURCES Stroke/Support Group:  Call [facility]  PROVIDED/REVIEWED AND GIVEN TO PATIENT Stroke warning signs and symptoms How to activate emergency medical system (call 911). Medications prescribed at discharge. Need for follow-up after discharge. Personal risk factors for stroke. Pneumonia vaccine given:  Flu vaccine given:  My questions have been answered, the writing is legible, and I understand these instructions.  I will adhere to these goals & educational materials that have  been provided to me after my discharge from the hospital.   __ Bathe/dress with assistance ___ Walk Independently    ___ Shower independently ___ Walk with assistance    ___ Shower with assistance ___ No alcohol     ___ Return to work/school ________  Special Instructions:    COMMUNITY REFERRALS UPON DISCHARGE:    Home Health:   PT,OT,SP    Agency:[facility]   Phone:[phone]   Date of last service:05/07/2016  Medical Equipment/Items [place]   [id number]  My questions have been answered and I understand these instructions. I will adhere to these goals and the provided educational materials after my discharge from the hospital.  Patient/Caregiver Signature _______________________________ Date __________  Clinician Signature _______________________________________ Date __________  Please bring this form and your medication list with you to all your follow-up doctor's appointments.

## 2016-05-06 NOTE — Progress Notes (Signed)
Social Work Patient ID: Amy [name], female   DOB: May 22, 1930, 82 y.o.   MRN: [id number]  Met with daughter and pt along with [facility] representative who is here to observe pt in therapies and determine how much Private duty care she will need at discharge tomorrow. Room set up and ready for her tomorrow and follow up therapies arranged. Will see daughter tomorrow for any last minute questions.

## 2016-05-07 LAB — GLUCOSE, CAPILLARY
GLUCOSE-CAPILLARY: 110 mg/dL — AB (ref 65–99)
Glucose-Capillary: 122 mg/dL — ABNORMAL HIGH (ref 65–99)

## 2016-05-07 MED ORDER — AMLODIPINE BESYLATE 5 MG PO TABS: 5.0000 mg | ORAL_TABLET | Freq: Every day | ORAL | 3 refills | Status: DC

## 2016-05-07 MED ORDER — GLIPIZIDE 5 MG PO TABS: 5.0000 mg | ORAL_TABLET | Freq: Two times a day (BID) | ORAL | 1 refills | Status: DC

## 2016-05-07 MED ORDER — OMEPRAZOLE 40 MG PO CPDR: 40.0000 mg | DELAYED_RELEASE_CAPSULE | Freq: Every day | ORAL | 11 refills | Status: DC

## 2016-05-07 MED ORDER — ATORVASTATIN CALCIUM 40 MG PO TABS: 40.0000 mg | ORAL_TABLET | Freq: Every day | ORAL | 1 refills | Status: DC

## 2016-05-07 MED ORDER — LEVETIRACETAM ER 500 MG PO TB24: 500.0000 mg | ORAL_TABLET | Freq: Every day | ORAL | 1 refills | Status: DC

## 2016-05-07 MED ORDER — DOCUSATE SODIUM 100 MG PO CAPS: 100.0000 mg | ORAL_CAPSULE | Freq: Two times a day (BID) | ORAL | 0 refills | Status: AC

## 2016-05-07 MED ORDER — METFORMIN HCL 500 MG PO TABS: 500.0000 mg | ORAL_TABLET | Freq: Every day | ORAL | 1 refills | Status: DC

## 2016-05-07 MED ORDER — SERTRALINE HCL 50 MG PO TABS: 50.0000 mg | ORAL_TABLET | Freq: Every day | ORAL | 1 refills | Status: DC

## 2016-05-07 MED ORDER — LEVOTHYROXINE SODIUM 175 MCG PO TABS: 175.0000 ug | ORAL_TABLET | Freq: Every day | ORAL | 11 refills | Status: DC

## 2016-05-07 NOTE — Discharge Summary (Signed)
NAMECHENE, GREG               ACCOUNT NO.:  [id number]  MEDICAL RECORD NO.:  [id number]  LOCATION:  4M03C                        FACILITY:  [facility]  PHYSICIAN:  Charlett Blake, M.D.DATE OF BIRTH:  [birth year]  DATE OF ADMISSION:  04/25/2016 DATE OF DISCHARGE:                              DISCHARGE SUMMARY   DISCHARGE DIAGNOSES: 1. 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 2. Subcutaneous Lovenox for deep venous thrombosis prophylaxis. 3. Pain management. 4. Depression. 5. Seizure prophylaxis. 6. Diabetes mellitus. 7. Peripheral neuropathy. 8. Hypertension. 9. Hypothyroidism. 10.Hyperlipidemia. 11.Spastic bladder. 12.Constipation.  HISTORY OF PRESENT ILLNESS:  This 82 year old right-handed female history of diabetes mellitus, hypertension, who lives alone independent prior to admission.  Presented on April 22, 2016, with altered mental status, decreased vision on the left.  Daughter had reported she had difficulty with her bowel for the past several weeks.  Cranial CT scan as well as CT cervical spine negative for acute changes.  There was moderate periventricular subcortical and deep white matter small vessel disease identified on cranial CT.  The patient did not receive tPA.  CT of the head and neck showed no significant stenosis or large vessel occlusion.  MRI of the brain showed no acute findings or changes compared to February 2017.  Microhemorrhages in the cortex  suggestive of possible amyloid angiopathy.  Carotid Doppler showed no ICA stenosis. EEG showed no seizure activity.  She remained on Keppra for seizure prophylaxis.  Neurology consulted, suspect right brain infarction, maintained on aspirin therapy.  Subcutaneous Lovenox for DVT prophylaxis.  Physical and occupational therapy ongoing.  The patient was admitted for comprehensive rehab program.  PAST MEDICAL HISTORY:  See discharge  diagnoses.  SOCIAL HISTORY:  Lives alone, independent prior to admission. Functional status upon admission to rehab services was moderate assist to stand; max assist to supine, moderate max activities daily living.  PHYSICAL EXAMINATION:  VITAL SIGNS:  Blood pressure 147/73, pulse 72, temperature 98, respirations 18. GENERAL:  This was an alert female, well developed, well nourished. Pupils round and reactive to light. NECK:  Showed no JVD.  Normal range of motion. CARDIAC:  Regular rate and rhythm.  No murmur. ABDOMEN:  Soft, nontender.  Good bowel sounds. RESPIRATORY:  Effort normal.  Breath sounds.  No respiratory distress. NEUROLOGIC:  She was alert and oriented to self.  Had difficulties with dates still in attention to the left.  REHABILITATION HOSPITAL COURSE:  The patient was admitted to inpatient rehab services with therapies initiated on a 3-hour daily basis, consisting of physical therapy, occupational therapy, speech therapy, and rehabilitation nursing.  The following issues were addressed during the patient's rehabilitation stay.  Pertaining to Ms. Lambrix, right cortical brain infarct as well as multiple bilateral small vessel infarcts and microhemorrhages remained stable, continued on aspirin therapy.  She would follow up Neurology Services. Subcutaneous Lovenox for DVT prophylaxis.  No bleeding episodes.  She continued on Zoloft for history of depression with emotional support provided.  She continued on Keppra for seizure prophylaxis.  EEG negative.  Diabetes mellitus. Peripheral neuropathy.  Hemoglobin A1c of 7.1. She remained on Glucotrol as well  as Glucophage.  Blood pressures overall controlled. No orthostatic changes.  Hormone supplement for hypothyroidism.  Bouts of constipation resolved with laxative assistance.  The patient received weekly collaborative interdisciplinary team conferences to discuss estimated length of stay, family teaching any barriers to her  discharge. The patient performed wheelchair mobility, supervision; bilateral lower extremities.  Car transfers to an SUV height for supervision, ambulating rolling walker and controlled environment with supervision, negotiating stairs with supervision.  Could gather belongings for activities of daily living and homemaking.  She could communicate her needs.  Working with attention to the left.  Full teaching completed and plan was to discharge home back to [facility] with added services.  DISCHARGE MEDICATIONS:  Include Norvasc 5 mg p.o. daily, aspirin 325 mg p.o. daily, Lipitor 40 mg p.o. daily, Colace 100 mg p.o. b.i.d., Glucotrol 5 mg p.o. b.i.d., Keppra XR 500 mg p.o. daily, Synthroid 175 mcg p.o. daily, Glucophage 500 mg p.o. daily, Protonix 80 mg p.o. daily, Zoloft 50 mg p.o. daily.  DIET:  1800 calorie ADA diet.  FOLLOWUP:  Patient would follow up with Dr. [name] at the outpatient rehab service center as directed; Dr. Antony Contras, Neurology Services, call for appointment 1 month; and Dr. Delma Freeze May 16, 2016.     Lauraine Rinne, P.A.   ______________________________ Charlett Blake, M.D.    DA/MEDQ  D:  05/06/2016  T:  05/07/2016  Job:  [id number]  cc:   Charlett Blake, M.D. Pramod P. Leonie Man, MD Ishmael Holter Sarajane Jews, MD

## 2016-05-07 NOTE — Progress Notes (Signed)
Social Work  Discharge Note  The overall goal for the admission was met for:   Discharge location: Yes-GOING TO [place]  Length of Stay: Yes-12 DAYS  Discharge activity level: Yes-SUPERVISION-MOD/I LEVEL  Home/community participation: Yes  Services provided included: MD, RD, PT, OT, SLP, RN, CM, TR, Pharmacy and SW  Financial Services: Private Insurance: BLUE MEDICARE  Follow-up services arranged: Home Health: [facility] DME: [place] and Patient/Family request agency HH: PREF HAD BEFORE, DME: NO PREF  Comments (or additional information):DAUGHTER HERE DAILY AND [facility] FOR PRIVATE SERVICES HERE YESTERDAY TO MAKE SURE PT WILL HAVE THE SERVICES SHE NEEDS AT [place]. ALL IN AGREEMENT WITH PLAN TO GO TO INDEPENDENT LIVING AT [facility] WITH ADDED SERVICES.  Patient/Family verbalized understanding of follow-up arrangements: Yes  Individual responsible for coordination of the follow-up plan: [name]-DAUGHTER  Confirmed correct DME delivered: Elease Hashimoto 05/07/2016    Elease Hashimoto

## 2016-05-07 NOTE — Progress Notes (Signed)
[place] PHYSICAL MEDICINE & REHABILITATION     PROGRESS NOTE    Subjective/Complaints: No problems overnite  ROS: pt denies nausea, vomiting, diarrhea, cough, shortness of breath or chest pain    Objective: Vital Signs: Blood pressure (!) 139/53, pulse (!) 59, temperature 98.2 F (36.8 C), temperature source Oral, resp. rate 17, height 5\' 7"  (1.702 m), weight 88.5 kg (195 lb), SpO2 96 %. No results found. No results for input(s): WBC, HGB, HCT, PLT in the last 72 hours. No results for input(s): NA, K, CL, GLUCOSE, BUN, CREATININE, CALCIUM in the last 72 hours.  Invalid input(s): CO CBG (last 3)   Recent Labs  05/06/16 1638 05/06/16 2039 05/07/16 0644  GLUCAP 145* 118* 110*    Wt Readings from Last 3 Encounters:  05/02/16 88.5 kg (195 lb)  04/22/16 74.8 kg (165 lb)  12/19/15 86.2 kg (190 lb)    Physical Exam:  Constitutional: She appears well-developedand well-nourished.  HENT:  Head: Normocephalic.  Right Ear: External earnormal.  Left Ear: External earnormal.  Eyes: EOMare normal. Pupils are equal, round, and reactive to light.  Pupils round and reactive to light Neck: Normal range of motion. Neck supple. No tracheal deviationpresent. No thyromegalypresent.  Cardiovascular: RRR Respiratory: clear without wheezes.  GI: Soft. Bowel sounds are normal. She exhibits no distension. There is no tenderness. There is no rebound.  Musculoskeletal: She exhibits no edema.  Neurological: She is alert.  Alert. Oriented to self, place, month.  Mild left inattention improving.  . Remains somewhatApraxic. Seems to sense pain in all 4. Motor 4/5 to 5/5 in all 4 limbs proximal to distal. .senses pain in all 4 limbs. DTR's are 1+.  Skin: intact  Psychiatric: pleasnt and cooperative  Assessment/Plan: 1. Functional and mobility deficits secondary to small right cortical brain infarct Stable for D/C today F/u PCP in 3-4 weeks F/u PM&R 2 weeks See D/C summary See  D/C instructions Function:  Bathing Bathing position   Position: Shower  Bathing parts Body parts bathed by patient: Right arm, Left arm, Chest, Abdomen, Front perineal area, Right upper leg, Left upper leg, Buttocks, Right lower leg, Left lower leg Body parts bathed by helper: Back  Bathing assist Assist Level: Supervision or verbal cues      Upper Body Dressing/Undressing Upper body dressing   What is the patient wearing?: Pull over shirt/dress, Bra Bra - Perfomed by patient: Thread/unthread right bra strap, Thread/unthread left bra strap, Hook/unhook bra (pull down sports bra) Bra - Perfomed by helper: Hook/unhook bra (pull down sports bra) Pull over shirt/dress - Perfomed by patient: Thread/unthread right sleeve, Thread/unthread left sleeve, Put head through opening, Pull shirt over trunk Pull over shirt/dress - Perfomed by helper: Put head through opening, Pull shirt over trunk        Upper body assist Assist Level: More than reasonable time      Lower Body Dressing/Undressing Lower body dressing   What is the patient wearing?: Underwear, Pants, [employer], Shoes Underwear - Performed by patient: Thread/unthread right underwear leg, Thread/unthread left underwear leg, Pull underwear up/down Underwear - Performed by helper: Thread/unthread right underwear leg, Thread/unthread left underwear leg, Pull underwear up/down Pants- Performed by patient: Thread/unthread right pants leg, Thread/unthread left pants leg, Pull pants up/down Pants- Performed by helper: Pull pants up/down, Fasten/unfasten pants   Non-skid slipper socks- Performed by helper: Don/doff right sock, Don/doff left sock     Shoes - Performed by patient: Don/doff right shoe, Don/doff left shoe, Fasten right, [employer]  left         TED Hose - Performed by helper: Don/doff right TED hose, Don/doff left TED hose  Lower body assist Assist for lower body dressing: Supervision or verbal cues      Toileting Toileting  Toileting activity did not occur: Safety/medical concerns Toileting steps completed by patient: Adjust clothing prior to toileting, Performs perineal hygiene, Adjust clothing after toileting Toileting steps completed by helper: Adjust clothing prior to toileting, Adjust clothing after toileting Toileting Assistive Devices: Grab bar or rail  Toileting assist Assist level: More than reasonable time   Transfers Chair/bed transfer Chair/bed transfer activity did not occur: N/A Chair/bed transfer method: Stand pivot Chair/bed transfer assist level: No Help, no cues, assistive device, takes more than a reasonable amount of time Chair/bed transfer assistive device: Armrests, Medical sales representative     Max distance: 150 Assist level: Supervision or verbal cues   Wheelchair   Type: Manual Max wheelchair distance: 150 Assist Level: Supervision or verbal cues  Cognition Comprehension Comprehension assist level: Understands complex 90% of the time/cues 10% of the time  Expression Expression assist level: Expresses complex 90% of the time/cues < 10% of the time  Social Interaction Social Interaction assist level: Interacts appropriately with others with medication or extra time (anti-anxiety, antidepressant).  Problem Solving Problem solving assist level: Solves basic 75 - 89% of the time/requires cueing 10 - 24% of the time  Memory Memory assist level: Recognizes or recalls 75 - 89% of the time/requires cueing 10 - 24% of the time   Medical Problem List and Plan: 1. Decreased functional mobility with balance deficits processing deficits as well as apraxiasecondary to small right cortical brain infarct/?seizure, in addition pt has multiple bilateral small vessel infarcts and microhemorhages which probably explain her decline over the last year   -Plan D/C to ILF at [facility] with added services 2. DVT Prophylaxis/Anticoagulation: Subcutaneous Lovenox. Monitor platelet counts  and any signs of bleeding 3. Pain Management: Tylenol as needed 4. Mood: Zoloft 100mg  daily 5. Neuropsych: This patient iscapable of making decisions on herown behalf. 6. Skin/Wound Care: contact rash to back  -steroid cream/sarna/double sheets 7. Fluids/Electrolytes/Nutrition: I personally reviewed the patient's labs today.   -all near normal 8.Seizure prophylaxis. Keppra-XR 500 mg daily 9..Diabetes mellitus with peripheral neuropathy. Hemoglobin A1c 7.1. Patient on Glucotrol 10 mg twice a day, Glucophage 1000 mg twice a day prior to admission.   -resumed glucotrol at 5mg  bid- CBG's reasonable-- cont current dose CBG (last 3)   Recent Labs  05/06/16 1638 05/06/16 2039 05/07/16 0644  GLUCAP 145* 118* 110*    11.Hypertension. No current antihypertensive medication. Patient on Norvasc 5 mg daily prior to admission.   -restarted 12/4 , BPs improved Vitals:   05/06/16 1318 05/07/16 0624  BP: (!) 151/66 (!) 139/53  Pulse: 69 (!) 59  Resp: 18 17  Temp: 98 F (36.7 C) 98.2 F (36.8 C)   12.Hypothyroidism. Synthroid 13.Hyperlipidemia. Lipitor 14.  Spastic bladder, post CVA, neg UA, anticholinergic discussed but pt concerned that she is on too many meds.  15.  Constipation improved with sorbitol, colace for prophyllaxis, pt reports good BMs     LOS (Days) 12 A FACE TO FACE EVALUATION WAS PERFORMED  [name] E 05/07/2016 7:54 AM

## 2016-05-08 ENCOUNTER — Telehealth: Payer: Self-pay | Admitting: *Deleted

## 2016-05-08 ENCOUNTER — Telehealth: Payer: Self-pay

## 2016-05-08 NOTE — Telephone Encounter (Signed)
Transitional care call completed, appointment confirmed, No packet mailed, patient asked to arrive 30 minutes early to fill out paperwork  Transitional Care Questions  Questions for our staff to ask patients on Transitional care 48 hour phone call:   1. Are you/is patient experiencing any problems since coming home? No  Are there any questions regarding any aspect of care? No   2. Are there any questions regarding medications administration/dosing?  No  Are meds being taken as prescribed? Yes Patient should review meds with caller to confirm   3. Have there been any falls? No  4. Has Home Health been to the house and/or have they contacted you? Yes  If not, have you tried to contact them? Can we help you contact them?   5. Are bowels and bladder emptying properly? Yes Are there any unexpected incontinence issues? No If applicable, is patient following bowel/bladder programs?  6. Any fevers, problems with breathing, unexpected pain?  No   7. Are there any skin problems or new areas of breakdown? No  8. Has the patient/family member arranged specialty MD follow up (ie cardiology/neurology/renal/surgical/etc)?Yes, they were encouraged to contact Dr. Leonie Man at neurology as well Can we help arrange? No   9. Does the patient need any other services or support that we can help arrange? No  10. Are caregivers following through as expected in assisting the patient? Yes   Has the patient quit smoking, drinking alcohol, or using drugs as recommended? No

## 2016-05-08 NOTE — Telephone Encounter (Deleted)
Transitional care call completed, appointment confirmed, No packet mailed, patient asked to arrive 30 minutes early to fill out paperwork  Transitional Care Questions  Questions for our staff to ask patients on Transitional care 48 hour phone call:   1. Are you/is patient experiencing any problems since coming home? No  Are there any questions regarding any aspect of care? No   2. Are there any questions regarding medications administration/dosing?  No  Are meds being taken as prescribed? Yes Patient should review meds with caller to confirm   3. Have there been any falls? No  4. Has Home Health been to the house and/or have they contacted you? Yes  If not, have you tried to contact them? Can we help you contact them?   5. Are bowels and bladder emptying properly? Yes Are there any unexpected incontinence issues? No If applicable, is patient following bowel/bladder programs?  6. Any fevers, problems with breathing, unexpected pain?  No   7. Are there any skin problems or new areas of breakdown? No  8. Has the patient/family member arranged specialty MD follow up (ie cardiology/neurology/renal/surgical/etc)?  Can we help arrange?   9. Does the patient need any other services or support that we can help arrange?  10. Are caregivers following through as expected in assisting the patient?   Has the patient quit smoking, drinking alcohol, or using drugs as recommended?

## 2016-05-08 NOTE — Telephone Encounter (Signed)
ATC pt at home number. N/A and no vm. WCB.

## 2016-05-09 ENCOUNTER — Encounter: Payer: Medicare Other | Admitting: Physical Medicine & Rehabilitation

## 2016-05-09 NOTE — Telephone Encounter (Signed)
ATC pt at home number. N/A and no vm. WCB.

## 2016-05-12 ENCOUNTER — Telehealth: Payer: Self-pay | Admitting: Family Medicine

## 2016-05-12 MED ORDER — ASPIRIN 81 MG PO TABS: 81.0000 mg | ORAL_TABLET | Freq: Every day | ORAL | 0 refills | Status: AC

## 2016-05-12 NOTE — Telephone Encounter (Signed)
Amy [name] with [facility] states they sent over a request on Friday. But pt has requested they fill her pill box daily and they need a current   med list with the dr's signature. Also need clarification on specifically the aspirin.  Pt was on 325 mg , but came home form hospital with orders for 81 mg. Which one should it be?  Fax:  [phone]

## 2016-05-12 NOTE — Telephone Encounter (Signed)
I would go with the 81 mg dose of aspirin. Please print out a current med list to fax them.

## 2016-05-13 NOTE — Telephone Encounter (Signed)
Pt is now resident at [facility]. Pt has not returned phone calls. Closing message.

## 2016-05-13 NOTE — Telephone Encounter (Signed)
Pt is returning misty call °

## 2016-05-13 NOTE — Telephone Encounter (Signed)
Left a message for a return call.

## 2016-05-14 ENCOUNTER — Telehealth: Payer: Self-pay | Admitting: Family Medicine

## 2016-05-14 NOTE — Telephone Encounter (Signed)
I faxed over the information requested to below number.

## 2016-05-14 NOTE — Telephone Encounter (Signed)
°  Darlina Guys with [facility] call to say that the pt will start home health on Friday 05/16/16    [phone]

## 2016-05-15 NOTE — Telephone Encounter (Signed)
lori called to confirm they received med list ans all is ok.

## 2016-05-16 ENCOUNTER — Encounter: Payer: Self-pay | Admitting: Family Medicine

## 2016-05-16 ENCOUNTER — Ambulatory Visit (INDEPENDENT_AMBULATORY_CARE_PROVIDER_SITE_OTHER): Payer: Medicare Other | Admitting: Family Medicine

## 2016-05-16 VITALS — BP 128/80 | HR 83 | Temp 98.1°F

## 2016-05-16 DIAGNOSIS — I639 Cerebral infarction, unspecified: Secondary | ICD-10-CM | POA: Diagnosis not present

## 2016-05-16 DIAGNOSIS — R2689 Other abnormalities of gait and mobility: Secondary | ICD-10-CM

## 2016-05-16 DIAGNOSIS — E119 Type 2 diabetes mellitus without complications: Secondary | ICD-10-CM | POA: Diagnosis not present

## 2016-05-16 DIAGNOSIS — Z23 Encounter for immunization: Secondary | ICD-10-CM

## 2016-05-16 NOTE — Progress Notes (Signed)
   Subjective:    Patient ID: Amy [name], female    DOB: Apr 09, 1930, 82 y.o.   MRN: [id number]  HPI Here to follow up a hospital stay from 04-22-16 to 04-25-16 for a right sided brain infarct. She was found by a neighbor lying on her floor and was taken to the ER. She presented with left sided weakness, left hemianopsia, and left sided neglect. A CT angiogram showed no evidence of a vascular stenosis or bleed. An MRI was unremarkable with no clear evidence of infarct. An EEG was normal. The possibility was raised of a possible seizure, but looking back I think this is very unlikely. There was no loss of bowel or bladder control. Labs were unremarkable. Her A1c was 7.1. Her symptoms quickly began to recede, and she was almost back to baseline within 48 hours. She was seen by Neurology and by Dr. [name] of [facility]. She was started on PT and OT. She was discharged to live in an independent unit at [facility]. She no longer drives and her car was sold. She is getting PT and OT. She is currently on aspirin 81 mg daily and Keppra XR 500 mg daily. She feels well in general.      Review of Systems  Constitutional: Positive for fatigue.  Respiratory: Negative.   Cardiovascular: Negative.   Gastrointestinal: Negative.   Genitourinary: Negative.   Neurological: Negative.        Objective:   Physical Exam  Constitutional: She is oriented to person, place, and time.  Alert, walks with a rolling walker   Neck: No thyromegaly present.  Cardiovascular: Normal rate, regular rhythm, normal heart sounds and intact distal pulses.   Pulmonary/Chest: Effort normal and breath sounds normal.  Lymphadenopathy:    She has no cervical adenopathy.  Neurological: She is alert and oriented to person, place, and time. No cranial nerve deficit. She exhibits normal muscle tone. Coordination normal.          Assessment & Plan:  She is recovering nicely from a right sided brain event,  most likely an infarct. She is getting PT and OT. She is on Keppra XR and aspirin. Her neurologic deficits have almost totally resolved. Her BP is table and her diabetes is well controlled. We will schedule her to follow up with Neurology soon. Given a Prevnar shot.  [name], MD

## 2016-05-16 NOTE — Progress Notes (Signed)
Pre visit review using our clinic review tool, if applicable. No additional management support is needed unless otherwise documented below in the visit note. 

## 2016-05-21 ENCOUNTER — Telehealth: Payer: Self-pay | Admitting: Family Medicine

## 2016-05-21 NOTE — Telephone Encounter (Signed)
Amy [name] with [facility] would like verbal for home health PT 1 wk/ 1 2 wk/ 6 For strengthening, balance and gait training

## 2016-05-22 ENCOUNTER — Telehealth: Payer: Self-pay | Admitting: Family Medicine

## 2016-05-22 ENCOUNTER — Telehealth: Payer: Self-pay | Admitting: Physical Medicine & Rehabilitation

## 2016-05-22 NOTE — Telephone Encounter (Signed)
Sp Therapist Hershal Coria [facility] needs verbal order for Sp Therapy for patient 1 wk1 2 wk3 please authorize and return call to [phone]

## 2016-05-22 NOTE — Telephone Encounter (Signed)
Okay to refill medications? They were last filled by the hospital

## 2016-05-22 NOTE — Telephone Encounter (Signed)
Amy [name] pharmacist is calling pt has been refilling her meds at another pharm and needs new rx for each medication send to [facility]

## 2016-05-23 NOTE — Telephone Encounter (Signed)
Verbal order given  

## 2016-05-23 NOTE — Telephone Encounter (Signed)
I spoke with Gracie and gave the below verbal orders.

## 2016-05-23 NOTE — Telephone Encounter (Signed)
Please call in this order  

## 2016-05-23 NOTE — Telephone Encounter (Signed)
Send 6 months of refills for all present meds to [facility]

## 2016-05-27 MED ORDER — LEVOTHYROXINE SODIUM 175 MCG PO TABS: 175.0000 ug | ORAL_TABLET | Freq: Every day | ORAL | 0 refills | Status: DC

## 2016-05-27 MED ORDER — SERTRALINE HCL 50 MG PO TABS: 50.0000 mg | ORAL_TABLET | Freq: Every day | ORAL | 1 refills | Status: DC

## 2016-05-27 MED ORDER — ATORVASTATIN CALCIUM 40 MG PO TABS: 40.0000 mg | ORAL_TABLET | Freq: Every day | ORAL | 0 refills | Status: DC

## 2016-05-27 MED ORDER — AMLODIPINE BESYLATE 5 MG PO TABS: 5.0000 mg | ORAL_TABLET | Freq: Every day | ORAL | 0 refills | Status: DC

## 2016-05-27 MED ORDER — METFORMIN HCL 500 MG PO TABS: 500.0000 mg | ORAL_TABLET | Freq: Every day | ORAL | 0 refills | Status: DC

## 2016-05-27 MED ORDER — LEVETIRACETAM ER 500 MG PO TB24: 500.0000 mg | ORAL_TABLET | Freq: Every day | ORAL | 0 refills | Status: DC

## 2016-05-27 MED ORDER — OMEPRAZOLE 40 MG PO CPDR: 40.0000 mg | DELAYED_RELEASE_CAPSULE | Freq: Every day | ORAL | 0 refills | Status: DC

## 2016-05-27 MED ORDER — GLIPIZIDE 5 MG PO TABS: 5.0000 mg | ORAL_TABLET | Freq: Two times a day (BID) | ORAL | 0 refills | Status: DC

## 2016-05-27 NOTE — Telephone Encounter (Signed)
I printed 8 scripts and faxed to [facility].

## 2016-05-27 NOTE — Addendum Note (Signed)
Addended by: Aggie Hacker A on: 05/27/2016 04:19 PM   Modules accepted: Orders

## 2016-05-28 ENCOUNTER — Telehealth: Payer: Self-pay | Admitting: Family Medicine

## 2016-05-28 NOTE — Telephone Encounter (Signed)
Juliann Pulse fax rxs this afternoon

## 2016-05-28 NOTE — Telephone Encounter (Signed)
[place] never received 8 rxs please refax

## 2016-05-28 NOTE — Telephone Encounter (Signed)
We are going to look for these scripts first.

## 2016-06-05 DIAGNOSIS — F329 Major depressive disorder, single episode, unspecified: Secondary | ICD-10-CM

## 2016-06-05 DIAGNOSIS — I10 Essential (primary) hypertension: Secondary | ICD-10-CM | POA: Diagnosis not present

## 2016-06-05 DIAGNOSIS — E039 Hypothyroidism, unspecified: Secondary | ICD-10-CM

## 2016-06-05 DIAGNOSIS — E1140 Type 2 diabetes mellitus with diabetic neuropathy, unspecified: Secondary | ICD-10-CM | POA: Diagnosis not present

## 2016-06-05 DIAGNOSIS — E785 Hyperlipidemia, unspecified: Secondary | ICD-10-CM | POA: Diagnosis not present

## 2016-06-05 DIAGNOSIS — I69354 Hemiplegia and hemiparesis following cerebral infarction affecting left non-dominant side: Secondary | ICD-10-CM | POA: Diagnosis not present

## 2016-06-16 ENCOUNTER — Ambulatory Visit (INDEPENDENT_AMBULATORY_CARE_PROVIDER_SITE_OTHER): Payer: Medicare HMO | Admitting: Family Medicine

## 2016-06-16 ENCOUNTER — Ambulatory Visit (INDEPENDENT_AMBULATORY_CARE_PROVIDER_SITE_OTHER)
Admission: RE | Admit: 2016-06-16 | Discharge: 2016-06-16 | Disposition: A | Discharge status: Home / Self Care | Payer: Medicare HMO | Source: Ambulatory Visit | Attending: Family Medicine | Admitting: Family Medicine

## 2016-06-16 ENCOUNTER — Encounter: Payer: Self-pay | Admitting: Family Medicine

## 2016-06-16 VITALS — BP 149/84 | HR 74 | Temp 98.4°F

## 2016-06-16 DIAGNOSIS — M546 Pain in thoracic spine: Secondary | ICD-10-CM

## 2016-06-16 MED ORDER — TRAMADOL HCL 50 MG PO TABS: 50.0000 mg | ORAL_TABLET | Freq: Four times a day (QID) | ORAL | 0 refills | Status: DC | PRN

## 2016-06-16 NOTE — Progress Notes (Signed)
   Subjective:    Patient ID: Amy [name], female    DOB: 09-Jun-1929, 83 y.o.   MRN: [id number]  HPI Here with a friend complaining of middle spine pain after she feel at home a week ago. She got up in the night to use the bathroom and fell against a wall. No LOC. Since then she has had pain in the center of her back which radiates around both flanks. Using Ibuprofen. No SOB.    Review of Systems  Constitutional: Negative.   Respiratory: Negative.   Cardiovascular: Negative.   Musculoskeletal: Positive for back pain.  Neurological: Negative.        Objective:   Physical Exam  Constitutional: She is oriented to person, place, and time. No distress.  Using a rolling walker  Cardiovascular: Normal rate, regular rhythm, normal heart sounds and intact distal pulses.   Pulmonary/Chest: Effort normal and breath sounds normal. No respiratory distress. She has no wheezes. She has no rales.  Musculoskeletal:  Tender over the thoracic spine, no spasms. Full extension of the spine is painful. All ribs are intact   Neurological: She is alert and oriented to person, place, and time.          Assessment & Plan:  Thoracic spine pain after a fall, possible compression fracture. She will take Aleve bid and use Tramadol at night for pain. Get Xrays of the thoracic spine.  [name], MD

## 2016-06-16 NOTE — Progress Notes (Signed)
Pre visit review using our clinic review tool, if applicable. No additional management support is needed unless otherwise documented below in the visit note. Pt declined to weigh 

## 2016-06-16 NOTE — Addendum Note (Signed)
Addended by: [name] A on: 06/16/2016 11:31 AM   Modules accepted: Orders

## 2016-06-16 NOTE — Addendum Note (Signed)
Addended by: [name] A on: 06/16/2016 04:42 PM   Modules accepted: Orders

## 2016-06-22 ENCOUNTER — Ambulatory Visit
Admission: RE | Admit: 2016-06-22 | Discharge: 2016-06-22 | Disposition: A | Discharge status: Home / Self Care | Payer: Medicare HMO | Source: Ambulatory Visit | Attending: Family Medicine | Admitting: Family Medicine

## 2016-06-22 DIAGNOSIS — M546 Pain in thoracic spine: Secondary | ICD-10-CM

## 2016-06-22 DIAGNOSIS — M5124 Other intervertebral disc displacement, thoracic region: Secondary | ICD-10-CM | POA: Diagnosis not present

## 2016-06-23 NOTE — Addendum Note (Signed)
Addended by: [name] A on: 06/23/2016 06:41 AM   Modules accepted: Orders

## 2016-06-24 ENCOUNTER — Other Ambulatory Visit: Payer: Self-pay | Admitting: Family Medicine

## 2016-06-24 ENCOUNTER — Telehealth: Payer: Self-pay | Admitting: Family Medicine

## 2016-06-24 MED ORDER — HYDROCODONE-ACETAMINOPHEN 5-325 MG PO TABS: 1.0000 | ORAL_TABLET | Freq: Four times a day (QID) | ORAL | 0 refills | Status: DC | PRN

## 2016-06-24 NOTE — Addendum Note (Signed)
Addended by: [name] A on: 06/24/2016 03:22 PM   Modules accepted: Orders

## 2016-06-24 NOTE — Telephone Encounter (Signed)
I spoke with pt and went over results from MRI. Pt requesting something else for pain, Tramadol is not helping at all.

## 2016-06-24 NOTE — Telephone Encounter (Signed)
I wrote for some Norco to try for pain

## 2016-06-24 NOTE — Telephone Encounter (Signed)
Script is ready for pick up here at front office and I left a voice message for pt with this information.  

## 2016-06-24 NOTE — Telephone Encounter (Signed)
Pt would like to hear about MRI results asap. Pt states she is very comfortable.

## 2016-06-25 ENCOUNTER — Other Ambulatory Visit: Payer: Self-pay | Admitting: Family Medicine

## 2016-06-25 DIAGNOSIS — M546 Pain in thoracic spine: Secondary | ICD-10-CM

## 2016-06-26 ENCOUNTER — Ambulatory Visit
Admission: RE | Admit: 2016-06-26 | Discharge: 2016-06-26 | Disposition: A | Discharge status: Home / Self Care | Payer: Medicare HMO | Source: Ambulatory Visit | Attending: Family Medicine | Admitting: Family Medicine

## 2016-06-26 DIAGNOSIS — M546 Pain in thoracic spine: Secondary | ICD-10-CM

## 2016-07-01 ENCOUNTER — Ambulatory Visit
Admission: RE | Admit: 2016-07-01 | Discharge: 2016-07-01 | Disposition: A | Discharge status: Home / Self Care | Payer: Medicare HMO | Source: Ambulatory Visit | Attending: Family Medicine | Admitting: Family Medicine

## 2016-07-01 DIAGNOSIS — M4854XA Collapsed vertebra, not elsewhere classified, thoracic region, initial encounter for fracture: Secondary | ICD-10-CM | POA: Diagnosis not present

## 2016-07-01 DIAGNOSIS — M546 Pain in thoracic spine: Secondary | ICD-10-CM

## 2016-07-01 MED ORDER — CEFAZOLIN SODIUM-DEXTROSE 2-4 GM/100ML-% IV SOLN
2.0000 g | Freq: Once | INTRAVENOUS | Status: AC
  Administered 2016-07-01: 2 g via INTRAVENOUS

## 2016-07-01 MED ORDER — FENTANYL CITRATE (PF) 100 MCG/2ML IJ SOLN
25.0000 ug | INTRAMUSCULAR | Status: DC | PRN
  Administered 2016-07-01 (×2): 25 ug via INTRAVENOUS

## 2016-07-01 MED ORDER — KETOROLAC TROMETHAMINE 30 MG/ML IJ SOLN: 30.0000 mg | Freq: Once | INTRAMUSCULAR | Status: DC

## 2016-07-01 MED ORDER — SODIUM CHLORIDE 0.9 % IV SOLN
Freq: Once | INTRAVENOUS | Status: AC
  Administered 2016-07-01: 08:00:00 via INTRAVENOUS

## 2016-07-01 MED ORDER — MIDAZOLAM HCL 2 MG/2ML IJ SOLN
1.0000 mg | INTRAMUSCULAR | Status: DC | PRN
  Administered 2016-07-01: 1 mg via INTRAVENOUS
  Administered 2016-07-01: 0.5 mg via INTRAVENOUS

## 2016-07-01 MED ORDER — IOPAMIDOL (ISOVUE-M 200) INJECTION 41%
3.0000 mL | Freq: Once | INTRAMUSCULAR | Status: AC
  Administered 2016-07-01: 3 mL

## 2016-07-01 NOTE — Discharge Instructions (Signed)
Vertebroplasty Discharge Instructions  1. You may resume a regular diet and any medications that you routinely take (including pain medications). 2. No driving the day of the procedure. 3. Upon discharge go home and rest for at least 4 hours.  May use an ice pack as needed to injection sites on back. 4. You may change the dressing on your back to bandaids tomorrow.  Change the bandaids daily until the site is healed. 5. Follow up with your doctor in 2-3 weeks to let him know how you are feeling. 6. Discuss with your doctor if bone-building therapy is appropriate for you, if you are not on this type of medication already.    Please contact our office at [phone] for the following symptoms:   Fever greater than 100 degrees  Increased swelling, pain, or redness at injection site.   Thank you for visiting [facility].

## 2016-07-01 NOTE — Progress Notes (Signed)
Sedation time 42 minutes for T7 VP. jkl

## 2016-07-08 ENCOUNTER — Ambulatory Visit: Payer: Self-pay | Admitting: Neurology

## 2016-07-10 ENCOUNTER — Telehealth: Payer: Self-pay

## 2016-07-10 ENCOUNTER — Encounter: Payer: Self-pay | Admitting: Neurology

## 2016-07-10 ENCOUNTER — Ambulatory Visit (INDEPENDENT_AMBULATORY_CARE_PROVIDER_SITE_OTHER): Payer: Medicare HMO | Admitting: Neurology

## 2016-07-10 VITALS — BP 125/77 | HR 81 | Ht 67.0 in | Wt 190.0 lb

## 2016-07-10 DIAGNOSIS — G40009 Localization-related (focal) (partial) idiopathic epilepsy and epileptic syndromes with seizures of localized onset, not intractable, without status epilepticus: Secondary | ICD-10-CM

## 2016-07-10 NOTE — Progress Notes (Signed)
[facility] [address] [place]. [place] [address] [phone]       OFFICE FOLLOW-UP NOTE  [name] Date of Birth:  [birth year] Medical Record Number:  [id number]   HPI:  69 year Caucasian lady seen today for the first office follow-up visit after hospital admission in November 2017 for suspected stroke versus seizure. She is accompanied by her son-in-law who provides most of the history. I have also reviewed hospital chart as well.Amy [name] an 83 y.o.femalewith a history of hypertension, hypothyroidism, diabetes mellitus and diverticulosis, presenting with new onset loss of vision on the left side as well as confusion, and to some extent agitation. She has no previous history of stroke nor TIA clinically, and has been taking aspirin 81 mg per day. CT scan of the head showed no acute intracranial abnormality. MRI was attempted,but patient was agitated and study could not be obtained. She was noted in the ED to be confused and to have complete visual loss on the left side. No focal weakness was noted. NIH stroke score the time of this evaluation was 10. She was LKW 04/21/2016. Patient was not administered IV t-PA secondary to beyond time under for treatment consideration. She was admitted for further evaluation and treatment. MRI scan of the brain did not show an acute infarct. There are several microhemorrhages noted on gradient-echo sequences suggestive of possible amyloid angiopathy. MRA of the brain was not done. Carotid Doppler showed no significant extracranial stenosis. CT angiogram of the brain showed no significant large vessel intracranial or extracranial stenosis.Transthoracic echo showed normal ejection fraction. LDL cholesterol was 114 mg percent. Hemoglobin A1c was 7.1. EEG was abnormal and showed focal right frontotemporal slowing suggestive of regional cortical or cerebellar dysfunction. No definite epileptiform activity was noted.  Patient  states that she's had remote history of 2 minor closed head injuries last summer. On one occasion she fell in church and hit her head but did not lose consciousness. She did subsequently her primary care physician and had outpatient imaging studies of the brain which was negative. On a second occasion she missed a step in her yard and fell on the ground. She has no known prior history of seizures. Patient since discharge has now moved into [facility] independent living apartment. She still has some balance issues and fell once  About a month ago. She however hurt her back and required the kyphoplasty by radiologist. She is currently walking with a wheeled walker and feels more confident.  ROS:   14 system review of systems is positive for confusion, memory loss, anxiety, restless legs, hearing loss, ringing in the ears and all other systems negative PMH:  Past Medical History:  Diagnosis Date  . ABSCESS 12/18/2009  . DIABETES MELLITUS, TYPE II 05/22/2009  . Diverticulosis   . Hemorrhoids   . HYPERGLYCEMIA 05/22/2009  . HYPERTENSION 04/13/2007  . HYPOTHYROIDISM 04/13/2007  . NEOPLASM, SKIN, UNCERTAIN BEHAVIOR 99991111  . Stroke ([place])   . Tubular adenoma of colon 02/2001    Social History:  Social History   Social History  . Marital status: Widowed    Spouse name: N/A  . Number of children: 3  . Years of education: N/A   Occupational History  . Retired    Social History Main Topics  . Smoking status: Never Smoker  . Smokeless tobacco: Never Used  . Alcohol use No  . Drug use: No  . Sexual activity: Not on file   Other Topics Concern  .  Not on file   Social History Narrative   Daily caffeine     Medications:   Current Outpatient Prescriptions on File Prior to Visit  Medication Sig Dispense Refill  . amLODipine (NORVASC) 5 MG tablet Take 1 tablet (5 mg total) by mouth daily. 90 tablet 0  . aspirin 81 MG tablet Take 1 tablet (81 mg total) by mouth daily. 30 tablet 0  .  atorvastatin (LIPITOR) 40 MG tablet Take 1 tablet (40 mg total) by mouth daily. 90 tablet 0  . docusate sodium (COLACE) 100 MG capsule Take 1 capsule (100 mg total) by mouth 2 (two) times daily. 10 capsule 0  . glipiZIDE (GLUCOTROL) 5 MG tablet Take 1 tablet (5 mg total) by mouth 2 (two) times daily before a meal. 180 tablet 0  . HYDROcodone-acetaminophen (NORCO) 5-325 MG tablet Take 1 tablet by mouth every 6 (six) hours as needed for moderate pain. 60 tablet 0  . levETIRAcetam (KEPPRA XR) 500 MG 24 hr tablet Take 1 tablet (500 mg total) by mouth daily. 90 tablet 0  . levothyroxine (SYNTHROID) 175 MCG tablet Take 1 tablet (175 mcg total) by mouth daily before breakfast. 90 tablet 0  . metFORMIN (GLUCOPHAGE) 500 MG tablet Take 1 tablet (500 mg total) by mouth daily with breakfast. 90 tablet 0  . omeprazole (PRILOSEC) 40 MG capsule Take 1 capsule (40 mg total) by mouth daily. 90 capsule 0  . sertraline (ZOLOFT) 50 MG tablet Take 1 tablet (50 mg total) by mouth daily. 30 tablet 1  . traMADol (ULTRAM) 50 MG tablet Take 1 tablet (50 mg total) by mouth every 6 (six) hours as needed for moderate pain. 60 tablet 0   No current facility-administered medications on file prior to visit.     Allergies:   Allergies  Allergen Reactions  . Ativan [Lorazepam] Other (See Comments)    Caused delirium, confusion, disorientation, slightly increased agitation.  . Diclofenac Sodium Nausea And Vomiting  . Feldene [Piroxicam] Other (See Comments)    Dizzy  . Latex Rash  . Tape Rash    Physical Exam General: well developed, well nourished elderly Caucasian lady, seated, in no evident distress Head: head normocephalic and atraumatic.  Neck: supple with no carotid or supraclavicular bruits Cardiovascular: regular rate and rhythm, no murmurs Musculoskeletal: no deformity Skin:  no rash/petichiae Vascular:  Normal pulses all extremities Vitals:   07/10/16 1422  BP: 125/77  Pulse: 81   Neurologic  Exam Mental Status: Awake and fully alert. Oriented to place and time. Recent and remote memory Diminished. Attention span, concentration and fund of knowledge in pad. Mood and affect appropriate.  Cranial Nerves: Fundoscopic exam reveals sharp disc margins. Pupils equal, briskly reactive to light. Extraocular movements full without nystagmus. Visual fields full to confrontation. Hearing intact. Facial sensation intact. Face, tongue, palate moves normally and symmetrically.  Motor: Normal bulk and tone. Normal strength in all tested extremity muscles. Sensory.: intact to touch ,pinprick .position and vibratory sensation.  Coordination: Rapid alternating movements normal in all extremities. Finger-to-nose and heel-to-shin performed accurately bilaterally. Gait and Station: Arises from chair without difficulty. Stance is stooped. Uses a wheeled walker and gait demonstrates mild imbalance .   Reflexes: 1+ and symmetric. Toes downgoing.       ASSESSMENT: 49 year lady with transient episode of confusion and left-sided neglect and weakness with an abnormal EEG likely unwitnessed seizure followed by postictal state.    PLAN: I had a long discussion with the patient and her  son-in-law regarding her recent episode of confusion and memory loss likely representing the complex partial seizure. I recommend she continue Keppra XR 500 mg daily. I advised her not to drive aspirin [employer]. We also discussed fall and safety prevention precautions. I recommend she use a walker at all times. She will return for follow-up in 6 months or call earlier if necessary. Greater than 50% of time during this 25 minute visit was spent on counseling,explanation of diagnosis, planning of further management, discussion with patient and family and coordination of care Antony Contras, MD  [facility] [address] [place], [place] [address]  Phone [phone] Fax [phone] Note:  This document was prepared with digital dictation and possible smart phrase technology. Any transcriptional errors that result from this process are unintentional

## 2016-07-10 NOTE — Patient Instructions (Signed)
I had a long discussion with the patient and her son-in-law regarding her recent episode of confusion and memory loss likely representing the complex partial seizure. I recommend she continue Keppra XR 500 mg daily. I advised her not to drive aspirin [employer]. We also discussed fall and safety prevention precautions. I recommend she use a walker at all times. She will return for follow-up in 6 months or call earlier if necessary.  Fall Prevention in the Home Falls can cause injuries and can affect people from all age groups. There are many simple things that you can do to make your home safe and to help prevent falls. What can I do on the outside of my home?  Regularly repair the edges of walkways and driveways and fix any cracks.  Remove high doorway thresholds.  Trim any shrubbery on the main path into your home.  Use bright outdoor lighting.  Clear walkways of debris and clutter, including tools and rocks.  Regularly check that handrails are securely fastened and in good repair. Both sides of any steps should have handrails.  Install guardrails along the edges of any raised decks or porches.  Have leaves, snow, and ice cleared regularly.  Use sand or salt on walkways during winter months.  In the garage, clean up any spills right away, including grease or oil spills. What can I do in the bathroom?  Use night lights.  Install grab bars by the toilet and in the tub and shower. Do not use towel bars as grab bars.  Use non-skid mats or decals on the floor of the tub or shower.  If you need to sit down while you are in the shower, use a plastic, non-slip stool.  Keep the floor dry. Immediately clean up any water that spills on the floor.  Remove soap buildup in the tub or shower on a regular basis.  Attach bath mats securely with double-sided non-slip rug tape.  Remove throw rugs and other tripping hazards from the floor. What can I do in the bedroom?  Use night  lights.  Make sure that a bedside light is easy to reach.  Do not use oversized bedding that drapes onto the floor.  Have a firm chair that has side arms to use for getting dressed.  Remove throw rugs and other tripping hazards from the floor. What can I do in the kitchen?  Clean up any spills right away.  Avoid walking on wet floors.  Place frequently used items in easy-to-reach places.  If you need to reach for something above you, use a sturdy step stool that has a grab bar.  Keep electrical cables out of the way.  Do not use floor polish or wax that makes floors slippery. If you have to use wax, make sure that it is non-skid floor wax.  Remove throw rugs and other tripping hazards from the floor. What can I do in the stairways?  Do not leave any items on the stairs.  Make sure that there are handrails on both sides of the stairs. Fix handrails that are broken or loose. Make sure that handrails are as long as the stairways.  Check any carpeting to make sure that it is firmly attached to the stairs. Fix any carpet that is loose or worn.  Avoid having throw rugs at the top or bottom of stairways, or secure the rugs with carpet tape to prevent them from moving.  Make sure that you have a light switch at  the top of the stairs and the bottom of the stairs. If you do not have them, have them installed. What are some other fall prevention tips?  Wear closed-toe shoes that fit well and support your feet. Wear shoes that have rubber soles or low heels.  When you use a stepladder, make sure that it is completely opened and that the sides are firmly locked. Have someone hold the ladder while you are using it. Do not climb a closed stepladder.  Add color or contrast paint or tape to grab bars and handrails in your home. Place contrasting color strips on the first and last steps.  Use mobility aids as needed, such as canes, walkers, scooters, and crutches.  Turn on lights if it is  dark. Replace any light bulbs that burn out.  Set up furniture so that there are clear paths. Keep the furniture in the same spot.  Fix any uneven floor surfaces.  Choose a carpet design that does not hide the edge of steps of a stairway.  Be aware of any and all pets.  Review your medicines with your healthcare provider. Some medicines can cause dizziness or changes in blood pressure, which increase your risk of falling. Talk with your health care provider about other ways that you can decrease your risk of falls. This may include working with a physical therapist or trainer to improve your strength, balance, and endurance. This information is not intended to replace advice given to you by your health care provider. Make sure you discuss any questions you have with your health care provider. Document Released: 05/02/2002 Document Revised: 10/09/2015 Document Reviewed: 06/16/2014 Elsevier Interactive Patient Education  2017 [employer].

## 2016-07-10 NOTE — Telephone Encounter (Signed)
Spoke with patient this morning in follow up to her vertebroplasty here 07/01/16.  She states she is doing "just fine" and wouldn't be afraid to come back here if she needed another VP!  She said we were "super nice" to her and treated her and her family "with such kindness."  Brita Romp, RN

## 2016-07-16 ENCOUNTER — Ambulatory Visit (INDEPENDENT_AMBULATORY_CARE_PROVIDER_SITE_OTHER): Payer: Medicare HMO | Admitting: Family Medicine

## 2016-07-16 ENCOUNTER — Encounter: Payer: Self-pay | Admitting: Family Medicine

## 2016-07-16 VITALS — BP 134/94 | HR 101 | Temp 97.7°F | Ht 67.0 in

## 2016-07-16 DIAGNOSIS — R1012 Left upper quadrant pain: Secondary | ICD-10-CM | POA: Diagnosis not present

## 2016-07-16 MED ORDER — DICYCLOMINE HCL 20 MG PO TABS: 20.0000 mg | ORAL_TABLET | Freq: Three times a day (TID) | ORAL | 2 refills | Status: DC | PRN

## 2016-07-16 MED ORDER — HYDROCODONE-ACETAMINOPHEN 5-325 MG PO TABS: 1.0000 | ORAL_TABLET | Freq: Four times a day (QID) | ORAL | 0 refills | Status: DC | PRN

## 2016-07-16 NOTE — Progress Notes (Signed)
Pre visit review using our clinic review tool, if applicable. No additional management support is needed unless otherwise documented below in the visit note. 

## 2016-07-16 NOTE — Progress Notes (Signed)
   Subjective:    Patient ID: Amy [name], female    DOB: 1929-12-29, 83 y.o.   MRN: [id number]  HPI Here for 3 days of constant sharp pains in the LUQ of the abdomen. These are similar in nature to the pains we have attempted to work up over the past 2 years, although the are more severe than ever. Changing positions does not affect the pain. Eating food does not affect the pain. She is nauseated but has not vomited. She is passing loose stools. No fever. Work up in the past has included labs, a CT of the abdomen and pelvis, and even upper endoscopy but no etiology for the pain was found. She has been under a lot of family stress lately and stress seems to make these pains worse. She has been using Tramadol with no relief.    Review of Systems  Constitutional: Negative.   Respiratory: Negative.   Cardiovascular: Negative.   Gastrointestinal: Positive for abdominal pain and nausea. Negative for abdominal distention, anal bleeding, blood in stool, constipation, diarrhea, rectal pain and vomiting.  Genitourinary: Negative.        Objective:   Physical Exam  Constitutional: She appears well-developed and well-nourished.  In pain, anxious, almost tearful   Eyes: No scleral icterus.  Cardiovascular: Normal rate, regular rhythm, normal heart sounds and intact distal pulses.   Pulmonary/Chest: Effort normal and breath sounds normal.  Abdominal: Soft. Bowel sounds are normal. She exhibits no distension and no mass. There is no rebound and no guarding.  Mildly tender under the left lower ribs and LUQ           Assessment & Plan:  Recurrent LUQ pain. Given Norco for pain. She will try Dicyclomine 20 mg in case this involves any GI spasms. Sent for labs.  [name], MD

## 2016-07-17 LAB — BASIC METABOLIC PANEL
BUN: 20 mg/dL (ref 6–23)
CALCIUM: 9.8 mg/dL (ref 8.4–10.5)
CO2: 29 mEq/L (ref 19–32)
CREATININE: 0.95 mg/dL (ref 0.40–1.20)
Chloride: 101 mEq/L (ref 96–112)
GFR: 59.49 mL/min — ABNORMAL LOW (ref >60.00)
Glucose, Bld: 122 mg/dL — ABNORMAL HIGH (ref 70–99)
Potassium: 4 mEq/L (ref 3.5–5.1)
SODIUM: 139 meq/L (ref 135–145)

## 2016-07-17 LAB — CBC WITH DIFFERENTIAL/PLATELET
BASOS ABS: 0.1 10*3/uL (ref 0.0–0.1)
Basophils Relative: 1 % (ref 0.0–3.0)
EOS ABS: 0.1 10*3/uL (ref 0.0–0.7)
Eosinophils Relative: 1 % (ref 0.0–5.0)
HEMATOCRIT: 36.8 % (ref 36.0–46.0)
Hemoglobin: 12.2 g/dL (ref 12.0–15.0)
LYMPHS PCT: 26.8 % (ref 12.0–46.0)
Lymphs Abs: 2.2 10*3/uL (ref 0.7–4.0)
MCHC: 33.2 g/dL (ref 30.0–36.0)
MCV: 89.2 fl (ref 78.0–100.0)
MONOS PCT: 8.7 % (ref 3.0–12.0)
Monocytes Absolute: 0.7 10*3/uL (ref 0.1–1.0)
NEUTROS ABS: 5.2 10*3/uL (ref 1.4–7.7)
NEUTROS PCT: 62.5 % (ref 43.0–77.0)
PLATELETS: 273 10*3/uL (ref 150.0–400.0)
RBC: 4.13 Mil/uL (ref 3.87–5.11)
RDW: 13.1 % (ref 11.5–15.5)
WBC: 8.2 10*3/uL (ref 4.0–10.5)

## 2016-07-17 LAB — HEPATIC FUNCTION PANEL
ALK PHOS: 97 U/L (ref 39–117)
ALT: 11 U/L (ref 0–35)
AST: 14 U/L (ref 0–37)
Albumin: 4.2 g/dL (ref 3.5–5.2)
BILIRUBIN DIRECT: 0.1 mg/dL (ref 0.0–0.3)
Total Bilirubin: 0.7 mg/dL (ref 0.2–1.2)
Total Protein: 7 g/dL (ref 6.0–8.3)

## 2016-07-17 LAB — LIPASE: LIPASE: 31 U/L (ref 11.0–59.0)

## 2016-07-17 LAB — AMYLASE: AMYLASE: 25 U/L — AB (ref 27–131)

## 2016-07-21 ENCOUNTER — Telehealth: Payer: Self-pay | Admitting: Family Medicine

## 2016-07-21 NOTE — Telephone Encounter (Signed)
Amy [name] please call this patient back

## 2016-07-21 NOTE — Telephone Encounter (Signed)
I is clear that stress plays a role in this pain. Call in Zoloft 25 mg to take daily, #30 with 2 rf. If she is not feeling better in 2 weeks, we will increase the dose

## 2016-07-21 NOTE — Telephone Encounter (Signed)
Pt state that her side is not feeling any better from last week and would like to know what Dr. Sarajane Jews would like for her to do.  Pharm:  [facility]

## 2016-07-22 NOTE — Telephone Encounter (Signed)
I spoke with pt, she is already taking Zoloft bid, she is not sure if it is stress related or not. Can we recommend anything else? She has already seen GI, Urology, and Neurology. She is getting ready to move and this is causing a little more stress in her life.

## 2016-07-22 NOTE — Telephone Encounter (Signed)
Tell her to increase the Zoloft to a total of 100 mg a day (take 4 tabs at a time). If she needs a new rx, call in 100 mg tabs to take one a day, #30 with 2 rf

## 2016-07-22 NOTE — Telephone Encounter (Signed)
I tried to call pt and no answer, will try again later.

## 2016-07-23 NOTE — Telephone Encounter (Signed)
I left a voice message for pt with below information, also updated dose change in chart.

## 2016-08-06 ENCOUNTER — Ambulatory Visit: Payer: Self-pay | Admitting: Neurology

## 2016-08-21 ENCOUNTER — Other Ambulatory Visit: Payer: Self-pay | Admitting: Family Medicine

## 2016-08-25 ENCOUNTER — Other Ambulatory Visit: Payer: Self-pay | Admitting: Family Medicine

## 2016-08-25 NOTE — Telephone Encounter (Signed)
Printed Rxs were called to [facility].

## 2016-08-27 ENCOUNTER — Other Ambulatory Visit: Payer: Self-pay | Admitting: Family Medicine

## 2016-09-18 ENCOUNTER — Other Ambulatory Visit: Payer: Self-pay | Admitting: Family Medicine

## 2016-09-18 MED ORDER — GLIPIZIDE 5 MG PO TABS: ORAL_TABLET | ORAL | 5 refills | Status: DC

## 2016-09-18 NOTE — Telephone Encounter (Signed)
Printed Rx called to [facility] at [phone].

## 2016-09-18 NOTE — Addendum Note (Signed)
Addended by: Agnes Lawrence on: 09/18/2016 06:01 PM   Modules accepted: Orders

## 2016-09-19 DIAGNOSIS — E039 Hypothyroidism, unspecified: Secondary | ICD-10-CM | POA: Diagnosis not present

## 2016-09-19 DIAGNOSIS — E119 Type 2 diabetes mellitus without complications: Secondary | ICD-10-CM | POA: Diagnosis not present

## 2016-09-19 DIAGNOSIS — R69 Illness, unspecified: Secondary | ICD-10-CM | POA: Diagnosis not present

## 2016-09-19 DIAGNOSIS — I10 Essential (primary) hypertension: Secondary | ICD-10-CM | POA: Diagnosis not present

## 2016-09-19 DIAGNOSIS — K219 Gastro-esophageal reflux disease without esophagitis: Secondary | ICD-10-CM | POA: Diagnosis not present

## 2016-09-19 DIAGNOSIS — G40909 Epilepsy, unspecified, not intractable, without status epilepticus: Secondary | ICD-10-CM | POA: Diagnosis not present

## 2016-09-19 DIAGNOSIS — E669 Obesity, unspecified: Secondary | ICD-10-CM | POA: Diagnosis not present

## 2016-09-19 DIAGNOSIS — K5900 Constipation, unspecified: Secondary | ICD-10-CM | POA: Diagnosis not present

## 2016-09-19 DIAGNOSIS — E785 Hyperlipidemia, unspecified: Secondary | ICD-10-CM | POA: Diagnosis not present

## 2016-09-19 DIAGNOSIS — Z7984 Long term (current) use of oral hypoglycemic drugs: Secondary | ICD-10-CM | POA: Diagnosis not present

## 2016-09-19 DIAGNOSIS — Z0000 Encounter for general adult medical examination without abnormal findings: Secondary | ICD-10-CM | POA: Diagnosis not present

## 2016-09-19 DIAGNOSIS — Z9181 History of falling: Secondary | ICD-10-CM | POA: Diagnosis not present

## 2016-09-19 DIAGNOSIS — Z79899 Other long term (current) drug therapy: Secondary | ICD-10-CM | POA: Diagnosis not present

## 2016-10-02 ENCOUNTER — Other Ambulatory Visit: Payer: Self-pay | Admitting: Family Medicine

## 2016-10-02 NOTE — Telephone Encounter (Signed)
Per Dr. Sarajane Jews okay to refill Zoloft for 1 year.

## 2016-10-16 ENCOUNTER — Other Ambulatory Visit: Payer: Self-pay | Admitting: Family Medicine

## 2016-10-29 ENCOUNTER — Ambulatory Visit (INDEPENDENT_AMBULATORY_CARE_PROVIDER_SITE_OTHER): Payer: Medicare HMO | Admitting: Family Medicine

## 2016-10-29 ENCOUNTER — Encounter: Payer: Self-pay | Admitting: Family Medicine

## 2016-10-29 VITALS — BP 126/78 | Temp 98.8°F

## 2016-10-29 DIAGNOSIS — E119 Type 2 diabetes mellitus without complications: Secondary | ICD-10-CM | POA: Diagnosis not present

## 2016-10-29 DIAGNOSIS — E039 Hypothyroidism, unspecified: Secondary | ICD-10-CM

## 2016-10-29 DIAGNOSIS — R2689 Other abnormalities of gait and mobility: Secondary | ICD-10-CM | POA: Diagnosis not present

## 2016-10-29 DIAGNOSIS — F329 Major depressive disorder, single episode, unspecified: Secondary | ICD-10-CM

## 2016-10-29 DIAGNOSIS — F32A Depression, unspecified: Secondary | ICD-10-CM

## 2016-10-29 DIAGNOSIS — I10 Essential (primary) hypertension: Secondary | ICD-10-CM | POA: Diagnosis not present

## 2016-10-29 DIAGNOSIS — R1012 Left upper quadrant pain: Secondary | ICD-10-CM

## 2016-10-29 DIAGNOSIS — R69 Illness, unspecified: Secondary | ICD-10-CM | POA: Diagnosis not present

## 2016-10-29 LAB — POC URINALSYSI DIPSTICK (AUTOMATED)
BILIRUBIN UA: NEGATIVE
GLUCOSE UA: NEGATIVE
Ketones, UA: NEGATIVE
NITRITE UA: NEGATIVE
PH UA: 6 (ref 5.0–8.0)
Protein, UA: NEGATIVE
RBC UA: NEGATIVE
UROBILINOGEN UA: 0.2 U/dL

## 2016-10-29 LAB — CBC WITH DIFFERENTIAL/PLATELET
BASOS ABS: 0 10*3/uL (ref 0.0–0.1)
Basophils Relative: 0.5 % (ref 0.0–3.0)
EOS ABS: 0.1 10*3/uL (ref 0.0–0.7)
Eosinophils Relative: 1.1 % (ref 0.0–5.0)
HEMATOCRIT: 36.8 % (ref 36.0–46.0)
Hemoglobin: 12.2 g/dL (ref 12.0–15.0)
LYMPHS PCT: 25.1 % (ref 12.0–46.0)
Lymphs Abs: 2 10*3/uL (ref 0.7–4.0)
MCHC: 33.2 g/dL (ref 30.0–36.0)
MCV: 88 fl (ref 78.0–100.0)
MONO ABS: 0.6 10*3/uL (ref 0.1–1.0)
Monocytes Relative: 7.1 % (ref 3.0–12.0)
NEUTROS PCT: 66.2 % (ref 43.0–77.0)
Neutro Abs: 5.3 10*3/uL (ref 1.4–7.7)
Platelets: 226 10*3/uL (ref 150.0–400.0)
RBC: 4.18 Mil/uL (ref 3.87–5.11)
RDW: 13.7 % (ref 11.5–15.5)
WBC: 8.1 10*3/uL (ref 4.0–10.5)

## 2016-10-29 LAB — BASIC METABOLIC PANEL
BUN: 19 mg/dL (ref 6–23)
CALCIUM: 10 mg/dL (ref 8.4–10.5)
CO2: 29 meq/L (ref 19–32)
Chloride: 102 mEq/L (ref 96–112)
Creatinine, Ser: 0.94 mg/dL (ref 0.40–1.20)
GFR: 60.18 mL/min (ref >60.00)
Glucose, Bld: 138 mg/dL — ABNORMAL HIGH (ref 70–99)
Potassium: 4.5 mEq/L (ref 3.5–5.1)
Sodium: 140 mEq/L (ref 135–145)

## 2016-10-29 LAB — TSH: TSH: 0.05 u[IU]/mL — AB (ref 0.35–4.50)

## 2016-10-29 LAB — HEPATIC FUNCTION PANEL
ALBUMIN: 4.3 g/dL (ref 3.5–5.2)
ALT: 12 U/L (ref 0–35)
AST: 13 U/L (ref 0–37)
Alkaline Phosphatase: 98 U/L (ref 39–117)
Bilirubin, Direct: 0.2 mg/dL (ref 0.0–0.3)
TOTAL PROTEIN: 7.1 g/dL (ref 6.0–8.3)
Total Bilirubin: 1 mg/dL (ref 0.2–1.2)

## 2016-10-29 LAB — LIPID PANEL
CHOLESTEROL: 130 mg/dL (ref 0–200)
HDL: 55.4 mg/dL (ref >39.00)
LDL CALC: 45 mg/dL (ref 0–99)
NonHDL: 74.8
TRIGLYCERIDES: 148 mg/dL (ref 0.0–149.0)
Total CHOL/HDL Ratio: 2
VLDL: 29.6 mg/dL (ref 0.0–40.0)

## 2016-10-29 LAB — HEMOGLOBIN A1C: HEMOGLOBIN A1C: 7.3 % — AB (ref 4.6–6.5)

## 2016-10-29 MED ORDER — GABAPENTIN 100 MG PO CAPS: 100.0000 mg | ORAL_CAPSULE | Freq: Three times a day (TID) | ORAL | 2 refills | Status: DC

## 2016-10-29 NOTE — Patient Instructions (Signed)
WE NOW OFFER   [facility] FAST TRACK!!!  SAME DAY Appointments for ACUTE CARE  Such as: Sprains, Injuries, cuts, abrasions, rashes, muscle pain, joint pain, back pain Colds, flu, sore throats, headache, allergies, cough, fever  Ear pain, sinus and eye infections Abdominal pain, nausea, vomiting, diarrhea, upset stomach Animal/insect bites  3 Easy Ways to Schedule: Walk-In Scheduling Call in scheduling Mychart Sign-up: [email or web address]         

## 2016-10-29 NOTE — Progress Notes (Signed)
   Subjective:    Patient ID: Amy [name], female    DOB: 1929-11-11, 83 y.o.   MRN: [id number]  HPI Here to follow up on several issues. She still has constant LUQ abdominal pain that really drives her crazy. Norco helps for awhile but she does not want to take this very often often. She has been taking Dicyclomine but this has not helped at all. Other than that she has done well. Er BP is stable. She continues on Keppra and she is due to follow up with Dr. Leonie Man in August.    Review of Systems  Constitutional: Negative.   HENT: Negative.   Eyes: Negative.   Respiratory: Negative.   Cardiovascular: Negative.   Gastrointestinal: Positive for abdominal pain. Negative for abdominal distention, anal bleeding, blood in stool, constipation, diarrhea, nausea, rectal pain and vomiting.  Genitourinary: Negative for decreased urine volume, difficulty urinating, dyspareunia, dysuria, enuresis, flank pain, frequency, hematuria, pelvic pain and urgency.  Musculoskeletal: Negative.   Skin: Negative.   Neurological: Negative.   Psychiatric/Behavioral: Negative.        Objective:   Physical Exam  Constitutional: She is oriented to person, place, and time. She appears well-developed and well-nourished. No distress.  HENT:  Head: Normocephalic and atraumatic.  Right Ear: External ear normal.  Left Ear: External ear normal.  Nose: Nose normal.  Mouth/Throat: Oropharynx is clear and moist. No oropharyngeal exudate.  Eyes: Conjunctivae and EOM are normal. Pupils are equal, round, and reactive to light. No scleral icterus.  Neck: Normal range of motion. Neck supple. No JVD present. No thyromegaly present.  Cardiovascular: Normal rate, regular rhythm, normal heart sounds and intact distal pulses.  Exam reveals no gallop and no friction rub.   No murmur heard. Pulmonary/Chest: Effort normal and breath sounds normal. No respiratory distress. She has no wheezes. She has no rales. She exhibits no  tenderness.  Abdominal: Soft. Bowel sounds are normal. She exhibits no distension and no mass. There is no rebound and no guarding.  She is tender along the left anterior inferior rib margin and in the LUQ   Musculoskeletal: Normal range of motion. She exhibits no edema or tenderness.  Lymphadenopathy:    She has no cervical adenopathy.  Neurological: She is alert and oriented to person, place, and time. She has normal reflexes. No cranial nerve deficit. She exhibits normal muscle tone. Coordination normal.  Skin: Skin is warm and dry. No rash noted. No erythema.  Psychiatric: She has a normal mood and affect. Her behavior is normal. Judgment and thought content normal.          Assessment & Plan:  Her HTN is stable. Her depression is stable. She still has LUQ pain which has eluded all testing so far. This may be from an irritated nerve so we will try her on Gabapentin 100 mg TID. Stop the Dicyclomine. Get fasting labs today including an A1c.  [name], MD

## 2016-10-30 ENCOUNTER — Other Ambulatory Visit: Payer: Self-pay | Admitting: Family Medicine

## 2016-11-04 ENCOUNTER — Encounter: Payer: Self-pay | Admitting: Nurse Practitioner

## 2016-11-17 DIAGNOSIS — L570 Actinic keratosis: Secondary | ICD-10-CM | POA: Diagnosis not present

## 2016-11-24 ENCOUNTER — Ambulatory Visit (INDEPENDENT_AMBULATORY_CARE_PROVIDER_SITE_OTHER): Payer: Medicare HMO | Admitting: Family Medicine

## 2016-11-24 ENCOUNTER — Encounter: Payer: Self-pay | Admitting: Family Medicine

## 2016-11-24 VITALS — BP 138/80 | Temp 97.9°F

## 2016-11-24 DIAGNOSIS — M546 Pain in thoracic spine: Secondary | ICD-10-CM

## 2016-11-24 DIAGNOSIS — G8929 Other chronic pain: Secondary | ICD-10-CM | POA: Diagnosis not present

## 2016-11-24 DIAGNOSIS — R1012 Left upper quadrant pain: Secondary | ICD-10-CM

## 2016-11-24 MED ORDER — GABAPENTIN 300 MG PO CAPS: 300.0000 mg | ORAL_CAPSULE | Freq: Three times a day (TID) | ORAL | 3 refills | Status: DC

## 2016-11-24 NOTE — Patient Instructions (Signed)
WE NOW OFFER   [facility] FAST TRACK!!!  SAME DAY Appointments for ACUTE CARE  Such as: Sprains, Injuries, cuts, abrasions, rashes, muscle pain, joint pain, back pain Colds, flu, sore throats, headache, allergies, cough, fever  Ear pain, sinus and eye infections Abdominal pain, nausea, vomiting, diarrhea, upset stomach Animal/insect bites  3 Easy Ways to Schedule: Walk-In Scheduling Call in scheduling Mychart Sign-up: [email or web address]         

## 2016-11-24 NOTE — Progress Notes (Signed)
   Subjective:    Patient ID: Amy [name], female    DOB: 1930-03-08, 83 y.o.   MRN: [id number]  HPI Here to follow up on chronic left side pain which started about 5 months ago. This started soon after she suffered a compression fracture of T7. She had a vetebroplasty which helped her back pain, but since then she has had an intermittent sharp pain stat starts in the left middle  of her back and radiates around the left flank to the left chest and upper abdomen. shehas had numerous exams and CT scans which has been unrevealing. Narcotic pains help but nothing lasts for very long. She has tried Gabapentin 100 mg BID for the last few weeks with little change.    Review of Systems  Constitutional: Negative.   Respiratory: Negative.   Cardiovascular: Positive for chest pain. Negative for palpitations and leg swelling.  Gastrointestinal: Positive for abdominal pain. Negative for abdominal distention, anal bleeding, blood in stool, constipation, diarrhea, nausea, rectal pain and vomiting.  Musculoskeletal: Positive for back pain.  Skin: Negative for rash.  Neurological: Negative.        Objective:   Physical Exam  Constitutional: She is oriented to person, place, and time. She appears well-developed and well-nourished. No distress.  Cardiovascular: Normal rate, regular rhythm, normal heart sounds and intact distal pulses.   Pulmonary/Chest: Effort normal and breath sounds normal.  Abdominal: Soft. Bowel sounds are normal. She exhibits no distension and no mass. There is no tenderness. There is no rebound and no guarding.  Musculoskeletal:  She is tender in the left middle back and around the left flank  Neurological: She is alert and oriented to person, place, and time.  Skin: No rash noted.          Assessment & Plan:  This flank pain seems to be neurologic in nature. The MRI scan in February showing the compression fracture did not show anything impinging on her spinal nerve  roots, but I wonder if this has changed over time. I suggested she increase the Gabapentin to 300 mg TID and we will refer her to Spine Surgery to assess further.  [name], MD

## 2016-12-04 DIAGNOSIS — M546 Pain in thoracic spine: Secondary | ICD-10-CM | POA: Diagnosis not present

## 2016-12-05 ENCOUNTER — Other Ambulatory Visit: Payer: Self-pay | Admitting: Orthopedic Surgery

## 2016-12-05 DIAGNOSIS — M546 Pain in thoracic spine: Secondary | ICD-10-CM

## 2016-12-10 ENCOUNTER — Ambulatory Visit
Admission: RE | Admit: 2016-12-10 | Discharge: 2016-12-10 | Disposition: A | Discharge status: Home / Self Care | Payer: Medicare HMO | Source: Ambulatory Visit | Attending: Orthopedic Surgery | Admitting: Orthopedic Surgery

## 2016-12-10 DIAGNOSIS — S22060A Wedge compression fracture of T7-T8 vertebra, initial encounter for closed fracture: Secondary | ICD-10-CM | POA: Diagnosis not present

## 2016-12-10 DIAGNOSIS — M546 Pain in thoracic spine: Secondary | ICD-10-CM

## 2016-12-15 DIAGNOSIS — M4804 Spinal stenosis, thoracic region: Secondary | ICD-10-CM | POA: Diagnosis not present

## 2016-12-18 ENCOUNTER — Telehealth: Payer: Self-pay | Admitting: Family Medicine

## 2016-12-18 NOTE — Telephone Encounter (Addendum)
Amy [name] is calling pt has new medication gabapentin. Pt has been taking 3 pills at one time instead of 1 pill three times a day. Amy [name] would like to be inform by our office anytime the patient is given a new medication so they can put in her pill box. Amy [name] will be sending a form md to sign that pt should  Be taking medication.

## 2016-12-19 ENCOUNTER — Telehealth: Payer: Self-pay

## 2016-12-19 NOTE — Telephone Encounter (Signed)
noted 

## 2016-12-19 NOTE — Telephone Encounter (Signed)
Patient is on the list for [employer] 2018 and may be a good candidate for an AWV. Please let me know if/when appt is scheduled.   

## 2016-12-22 ENCOUNTER — Other Ambulatory Visit: Payer: Medicare HMO

## 2016-12-29 ENCOUNTER — Telehealth: Payer: Self-pay | Admitting: Family Medicine

## 2016-12-29 DIAGNOSIS — I6330 Cerebral infarction due to thrombosis of unspecified cerebral artery: Secondary | ICD-10-CM

## 2016-12-29 NOTE — Telephone Encounter (Signed)
° ° ° °  Pt call to ask for a RX to get a walker maybe thru  . [facility] or who ever she can get on through  Pt said she is still having pain around her waist and back    Pt request refill of the following:  traMADol (ULTRAM) 50 MG tablet   Phamacy:  [facility]
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
## 2016-12-30 NOTE — Telephone Encounter (Signed)
The rx for the wheelchair is ready to pick up. Call in Tramadol to take 2 tabs every 6 hours prn pain, #120 with one rf

## 2016-12-30 NOTE — Telephone Encounter (Signed)
° ° °  Pt following up on her request

## 2016-12-30 NOTE — Telephone Encounter (Signed)
The rx was changed to a rolling walker

## 2016-12-31 MED ORDER — TRAMADOL HCL 50 MG PO TABS: 100.0000 mg | ORAL_TABLET | Freq: Four times a day (QID) | ORAL | 1 refills | Status: DC | PRN

## 2016-12-31 MED ORDER — TRAMADOL HCL 50 MG PO TABS: 50.0000 mg | ORAL_TABLET | Freq: Four times a day (QID) | ORAL | 1 refills | Status: DC | PRN

## 2016-12-31 NOTE — Telephone Encounter (Signed)
I ordered walker in computer, sent Melissa with [facility] a message, called in script for Ultram to [facility] and spoke with pt, there was a really bad phone connection at this time.

## 2017-01-05 DIAGNOSIS — H6123 Impacted cerumen, bilateral: Secondary | ICD-10-CM | POA: Diagnosis not present

## 2017-01-07 ENCOUNTER — Ambulatory Visit: Payer: Medicare HMO | Admitting: Nurse Practitioner

## 2017-01-09 DIAGNOSIS — R269 Unspecified abnormalities of gait and mobility: Secondary | ICD-10-CM | POA: Diagnosis not present

## 2017-01-09 DIAGNOSIS — I639 Cerebral infarction, unspecified: Secondary | ICD-10-CM | POA: Diagnosis not present

## 2017-01-09 DIAGNOSIS — R414 Neurologic neglect syndrome: Secondary | ICD-10-CM | POA: Diagnosis not present

## 2017-01-09 DIAGNOSIS — M546 Pain in thoracic spine: Secondary | ICD-10-CM | POA: Diagnosis not present

## 2017-01-29 ENCOUNTER — Other Ambulatory Visit: Payer: Self-pay | Admitting: Family Medicine

## 2017-01-29 MED ORDER — AMLODIPINE BESYLATE 5 MG PO TABS: ORAL_TABLET | ORAL | 3 refills | Status: DC

## 2017-01-29 NOTE — Addendum Note (Signed)
Addended by: Virl Cagey on: 01/29/2017 02:49 PM   Modules accepted: Orders

## 2017-02-17 DIAGNOSIS — R69 Illness, unspecified: Secondary | ICD-10-CM | POA: Diagnosis not present

## 2017-02-18 ENCOUNTER — Other Ambulatory Visit: Payer: Self-pay | Admitting: Family Medicine

## 2017-03-12 ENCOUNTER — Telehealth: Payer: Self-pay | Admitting: Family Medicine

## 2017-03-12 NOTE — Telephone Encounter (Signed)
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  Pt is consisently missing her bedtime dose of  gabapentin (NEURONTIN) 300 MG capsule  She takes all her other meds, which are am and noon, just seems to miss that one med dose at bedtime. Wanted Dr Sarajane Jews to be aware.

## 2017-03-13 NOTE — Telephone Encounter (Signed)
Noted  

## 2017-03-18 ENCOUNTER — Encounter: Payer: Self-pay | Admitting: Family Medicine

## 2017-03-18 DIAGNOSIS — H52223 Regular astigmatism, bilateral: Secondary | ICD-10-CM | POA: Diagnosis not present

## 2017-03-18 DIAGNOSIS — H5203 Hypermetropia, bilateral: Secondary | ICD-10-CM | POA: Diagnosis not present

## 2017-03-18 DIAGNOSIS — E119 Type 2 diabetes mellitus without complications: Secondary | ICD-10-CM | POA: Diagnosis not present

## 2017-03-18 DIAGNOSIS — H04123 Dry eye syndrome of bilateral lacrimal glands: Secondary | ICD-10-CM | POA: Diagnosis not present

## 2017-03-18 DIAGNOSIS — H524 Presbyopia: Secondary | ICD-10-CM | POA: Diagnosis not present

## 2017-03-18 LAB — HM DIABETES EYE EXAM

## 2017-03-19 ENCOUNTER — Other Ambulatory Visit: Payer: Self-pay | Admitting: Family Medicine

## 2017-03-25 ENCOUNTER — Other Ambulatory Visit: Payer: Self-pay | Admitting: Family Medicine

## 2017-04-22 ENCOUNTER — Other Ambulatory Visit: Payer: Self-pay | Admitting: Family Medicine

## 2017-04-29 ENCOUNTER — Other Ambulatory Visit: Payer: Self-pay | Admitting: Family Medicine

## 2017-05-11 ENCOUNTER — Telehealth: Payer: Self-pay | Admitting: Family Medicine

## 2017-05-11 NOTE — Telephone Encounter (Signed)
Copied from [place] #[id number]. Topic: Quick Communication - See Telephone Encounter >> May 11, 2017  2:50 PM Arletha Grippe wrote: CRM for notification. See Telephone encounter for:   05/11/17.Caren Macadam from [facility] called they fill pill boxes for pt.  - pt is only taking gabapentin twice a day. They would like order for gabapentin twice a day 300 mg.  Cb number is [phone] Fax [phone]

## 2017-05-12 NOTE — Telephone Encounter (Signed)
Dr. Fry - Please advise. Thanks! 

## 2017-05-12 NOTE — Addendum Note (Signed)
Addended by: Dorrene German on: 05/12/2017 03:28 PM   Modules accepted: Orders

## 2017-05-12 NOTE — Telephone Encounter (Signed)
Change Gabapentin to 300 mg bid

## 2017-05-13 ENCOUNTER — Telehealth: Payer: Self-pay | Admitting: Family Medicine

## 2017-05-13 MED ORDER — GABAPENTIN 300 MG PO CAPS: ORAL_CAPSULE | ORAL | 1 refills | Status: DC

## 2017-05-13 NOTE — Telephone Encounter (Signed)
Called [facility] and and spoke to a nurse her name was Mickel Baas. She asked for me to send a copy of the Rx to them. Fax number was [phone]. Rx was faxed to [facility].

## 2017-05-13 NOTE — Telephone Encounter (Signed)
Fax  Request  For  Gabapentin  Dose  Change  - [facility]

## 2017-05-13 NOTE — Telephone Encounter (Signed)
Medication filled to pharmacy as requested w/ updated instructions. Returned call to [facility] and left message on nursing staff voicemail to notify them.

## 2017-05-13 NOTE — Telephone Encounter (Signed)
Copied from [place] [id number]. Topic: Quick Communication - Rx Refill/Question >> May 13, 2017  9:39 AM Arletha Grippe wrote: Has the patient contacted their pharmacy? No.   (Agent: If no, request that the patient contact the pharmacy for the refill.)   Preferred Pharmacy (with phone number or street name): n/a Tory Emerald with [facility] called - they need to have a fax for the med change, changing decrease in gabapentin. This was a verbal order, Please fax to:  fax number is  [phone] cb # [phone] if have any questions.     Agent: Please be advised that RX refills may take up to 3 business days. We ask that you follow-up with your pharmacy.

## 2017-05-13 NOTE — Addendum Note (Signed)
Addended by: Dorrene German on: 05/13/2017 09:23 AM   Modules accepted: Orders

## 2017-05-20 ENCOUNTER — Other Ambulatory Visit: Payer: Self-pay | Admitting: Family Medicine

## 2017-05-21 NOTE — Telephone Encounter (Signed)
Last Ov 11/24/2016. Glipizide was last refilled 03/19/2017 disp 60 with 1 refill. Atorvastatin was last refilled 04/22/2017 disp 30 with no refills.

## 2017-06-04 ENCOUNTER — Other Ambulatory Visit: Payer: Self-pay | Admitting: Family Medicine

## 2017-06-17 ENCOUNTER — Other Ambulatory Visit: Payer: Self-pay | Admitting: Family Medicine

## 2017-06-19 NOTE — Telephone Encounter (Signed)
Last OV 11/24/2016  Glipizide last refilled 05/21/2017 disp 60 with no refills  Atorvastatin last refilled 05/21/2017 disp 30 with no refills  Rx sent

## 2017-07-01 ENCOUNTER — Other Ambulatory Visit: Payer: Self-pay | Admitting: Family Medicine

## 2017-07-04 ENCOUNTER — Ambulatory Visit (HOSPITAL_COMMUNITY)
Admission: EM | Admit: 2017-07-04 | Discharge: 2017-07-04 | Disposition: A | Discharge status: Home / Self Care | Payer: Medicare HMO | Attending: Physician Assistant | Admitting: Physician Assistant

## 2017-07-04 ENCOUNTER — Ambulatory Visit (INDEPENDENT_AMBULATORY_CARE_PROVIDER_SITE_OTHER): Payer: Medicare HMO

## 2017-07-04 ENCOUNTER — Encounter (HOSPITAL_COMMUNITY): Payer: Self-pay | Admitting: Emergency Medicine

## 2017-07-04 DIAGNOSIS — M542 Cervicalgia: Secondary | ICD-10-CM

## 2017-07-04 DIAGNOSIS — M47812 Spondylosis without myelopathy or radiculopathy, cervical region: Secondary | ICD-10-CM

## 2017-07-04 NOTE — ED Notes (Signed)
Patient discharged by provider.

## 2017-07-04 NOTE — Discharge Instructions (Signed)
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 As we discussed options, use moist heat with salon pas or Aspercreme with lidocaine. You may use your pain medications as prescribed by Dr. Sarajane Jews. Try to sleep with a towel rolled up under your pillow. If you worsen and become more concerned then certainly don't hesitate to go to the ED. I hope you feel better.
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
## 2017-07-04 NOTE — ED Triage Notes (Signed)
PT reports head and neck pain that started yesterday. PT denies any injury.

## 2017-07-15 ENCOUNTER — Other Ambulatory Visit: Payer: Self-pay | Admitting: Family Medicine

## 2017-08-04 ENCOUNTER — Other Ambulatory Visit: Payer: Self-pay | Admitting: Family Medicine

## 2017-08-12 ENCOUNTER — Other Ambulatory Visit: Payer: Self-pay | Admitting: Family Medicine

## 2017-09-03 ENCOUNTER — Other Ambulatory Visit: Payer: Self-pay | Admitting: Family Medicine

## 2017-10-01 ENCOUNTER — Other Ambulatory Visit: Payer: Self-pay | Admitting: Family Medicine

## 2017-10-01 DIAGNOSIS — M25562 Pain in left knee: Secondary | ICD-10-CM | POA: Diagnosis not present

## 2017-10-01 DIAGNOSIS — M25561 Pain in right knee: Secondary | ICD-10-CM | POA: Diagnosis not present

## 2017-10-01 DIAGNOSIS — G8929 Other chronic pain: Secondary | ICD-10-CM | POA: Diagnosis not present

## 2017-10-01 DIAGNOSIS — M170 Bilateral primary osteoarthritis of knee: Secondary | ICD-10-CM | POA: Diagnosis not present

## 2017-10-02 ENCOUNTER — Other Ambulatory Visit: Payer: Self-pay | Admitting: Family Medicine

## 2017-10-28 ENCOUNTER — Other Ambulatory Visit: Payer: Self-pay | Admitting: Family Medicine

## 2017-10-29 ENCOUNTER — Other Ambulatory Visit: Payer: Self-pay

## 2017-10-29 MED ORDER — OMEPRAZOLE 40 MG PO CPDR: DELAYED_RELEASE_CAPSULE | ORAL | 1 refills | Status: DC

## 2017-10-29 MED ORDER — SERTRALINE HCL 50 MG PO TABS: 50.0000 mg | ORAL_TABLET | Freq: Every day | ORAL | 1 refills | Status: DC

## 2017-10-29 NOTE — Telephone Encounter (Signed)
Last OV 11/24/2016

## 2017-10-30 ENCOUNTER — Other Ambulatory Visit: Payer: Self-pay | Admitting: Family Medicine

## 2017-11-03 ENCOUNTER — Other Ambulatory Visit: Payer: Self-pay | Admitting: Family Medicine

## 2017-11-04 ENCOUNTER — Telehealth: Payer: Self-pay

## 2017-11-04 MED ORDER — LEVETIRACETAM 500 MG PO TABS: 500.0000 mg | ORAL_TABLET | Freq: Two times a day (BID) | ORAL | 11 refills | Status: DC

## 2017-11-04 NOTE — Telephone Encounter (Signed)
Rx has been sent since the pharmacy is out of stock of Keppra 500 ER

## 2017-11-04 NOTE — Telephone Encounter (Signed)
Change to Keppra regular release 500 mg to take BID, call in #60 with 11 rf

## 2017-11-04 NOTE — Telephone Encounter (Signed)
Fax from [facility] @ [facility] is out of the ER of Levetiracetam  Could we re-send in Levetiracetam 500 Regular release and NOT extended  Sent to PCP to advise

## 2017-11-12 ENCOUNTER — Telehealth: Payer: Self-pay | Admitting: Family Medicine

## 2017-11-12 NOTE — Telephone Encounter (Signed)
Copied from [place] [id number]. Topic: Quick Communication - See Telephone Encounter >> Nov 12, 2017  9:41 AM Boyd Kerbs wrote: CRM for notification. See Telephone encounter for: 11/12/17.  Tory Emerald with [facility] [phone] refill ;  levETIRAcetam (KEPPRA) 500 MG tablet 60 tablet 11 11/04/2017   Sig - Route: Take 1 tablet (500 mg total) by mouth 2 (two) times daily. - Oral  Sent to pharmacy as: levETIRAcetam (KEPPRA) 500 MG tablet   There is another prescription for 24 hour too  Please call to verify 2 tablets a day

## 2017-11-13 NOTE — Telephone Encounter (Signed)
Called [facility] and left a VM to call us back in regards to the pt.

## 2017-11-13 NOTE — Telephone Encounter (Signed)
Joycelyn Man called to let [facility] know they have called the pharmacy([facility]) and have gotten the needed info from the pharmacy; she states they were just needing signed orders from the pcp; contact Ulis Rias if needed

## 2017-11-13 NOTE — Telephone Encounter (Signed)
Keppra EX 500 MG last refilled 10/30/2017 disp 30 with no refills   Resent Rx was for Keppra 500 MG BID sent on 11/04/2017   Sent to PCP to verify for clarification we do want the pt taking the Keppra 500 MG regular release BID correct?

## 2017-11-13 NOTE — Telephone Encounter (Signed)
Yes, take the regular release 500 mg bid

## 2017-11-13 NOTE — Telephone Encounter (Signed)
Done

## 2017-11-13 NOTE — Telephone Encounter (Signed)
Sent to PCP for written orders to fax to Joycelyn Man in order to administer medication to pt.

## 2017-11-25 ENCOUNTER — Other Ambulatory Visit: Payer: Self-pay | Admitting: Family Medicine

## 2017-12-02 ENCOUNTER — Telehealth: Payer: Self-pay | Admitting: Family Medicine

## 2017-12-09 NOTE — Telephone Encounter (Signed)
Zigmund Daniel calling from [facility] states that the patient is out of her atorvastatin (LIPITOR) 40 MG tablet and they are aware that the patient needs an appt before  a refill can be placed. She is going to get in contact with the patient daughter so she can call to make her an appt. In the meantime she is wondering if a 30 day supply can be called in so the patient don't have any interruption with the medication . Please call when the medication is called in CB# [phone]

## 2017-12-10 ENCOUNTER — Telehealth: Payer: Self-pay

## 2017-12-10 MED ORDER — ATORVASTATIN CALCIUM 40 MG PO TABS: 40.0000 mg | ORAL_TABLET | Freq: Every day | ORAL | 0 refills | Status: DC

## 2017-12-10 NOTE — Telephone Encounter (Signed)
Called [place] and left a detailed VM that we did send in a 30 day supply to [facility].

## 2017-12-10 NOTE — Telephone Encounter (Signed)
We have sent in a 30 day supply.

## 2017-12-23 ENCOUNTER — Other Ambulatory Visit: Payer: Self-pay | Admitting: Family Medicine

## 2017-12-31 ENCOUNTER — Other Ambulatory Visit: Payer: Self-pay | Admitting: Family Medicine

## 2017-12-31 NOTE — Telephone Encounter (Signed)
Last refill pt will need an office visit for more refills   

## 2018-01-01 ENCOUNTER — Ambulatory Visit (INDEPENDENT_AMBULATORY_CARE_PROVIDER_SITE_OTHER): Payer: Medicare HMO | Admitting: Family Medicine

## 2018-01-01 ENCOUNTER — Encounter: Payer: Self-pay | Admitting: Family Medicine

## 2018-01-01 VITALS — BP 130/74 | HR 82 | Temp 98.3°F | Ht 65.0 in

## 2018-01-01 DIAGNOSIS — F329 Major depressive disorder, single episode, unspecified: Secondary | ICD-10-CM

## 2018-01-01 DIAGNOSIS — R69 Illness, unspecified: Secondary | ICD-10-CM | POA: Diagnosis not present

## 2018-01-01 DIAGNOSIS — E039 Hypothyroidism, unspecified: Secondary | ICD-10-CM

## 2018-01-01 DIAGNOSIS — I10 Essential (primary) hypertension: Secondary | ICD-10-CM

## 2018-01-01 DIAGNOSIS — F32A Depression, unspecified: Secondary | ICD-10-CM

## 2018-01-01 DIAGNOSIS — E119 Type 2 diabetes mellitus without complications: Secondary | ICD-10-CM | POA: Diagnosis not present

## 2018-01-01 LAB — CBC WITH DIFFERENTIAL/PLATELET
BASOS ABS: 0 10*3/uL (ref 0.0–0.1)
Basophils Relative: 0.4 % (ref 0.0–3.0)
Eosinophils Absolute: 0.2 10*3/uL (ref 0.0–0.7)
Eosinophils Relative: 2.2 % (ref 0.0–5.0)
HCT: 35.9 % — ABNORMAL LOW (ref 36.0–46.0)
HEMOGLOBIN: 12 g/dL (ref 12.0–15.0)
LYMPHS PCT: 28 % (ref 12.0–46.0)
Lymphs Abs: 2 10*3/uL (ref 0.7–4.0)
MCHC: 33.5 g/dL (ref 30.0–36.0)
MCV: 88.8 fl (ref 78.0–100.0)
MONOS PCT: 6.7 % (ref 3.0–12.0)
Monocytes Absolute: 0.5 10*3/uL (ref 0.1–1.0)
Neutro Abs: 4.5 10*3/uL (ref 1.4–7.7)
Neutrophils Relative %: 62.7 % (ref 43.0–77.0)
Platelets: 204 10*3/uL (ref 150.0–400.0)
RBC: 4.05 Mil/uL (ref 3.87–5.11)
RDW: 14.4 % (ref 11.5–15.5)
WBC: 7.2 10*3/uL (ref 4.0–10.5)

## 2018-01-01 LAB — BASIC METABOLIC PANEL
BUN: 17 mg/dL (ref 6–23)
CALCIUM: 9.8 mg/dL (ref 8.4–10.5)
CO2: 28 mEq/L (ref 19–32)
CREATININE: 0.99 mg/dL (ref 0.40–1.20)
Chloride: 104 mEq/L (ref 96–112)
GFR: 56.53 mL/min — AB (ref >60.00)
GLUCOSE: 151 mg/dL — AB (ref 70–99)
Potassium: 4.3 mEq/L (ref 3.5–5.1)
Sodium: 140 mEq/L (ref 135–145)

## 2018-01-01 LAB — LIPID PANEL
CHOL/HDL RATIO: 2
CHOLESTEROL: 114 mg/dL (ref 0–200)
HDL: 49 mg/dL (ref >39.00)
LDL CALC: 42 mg/dL (ref 0–99)
NonHDL: 64.99
TRIGLYCERIDES: 114 mg/dL (ref 0.0–149.0)
VLDL: 22.8 mg/dL (ref 0.0–40.0)

## 2018-01-01 LAB — HEPATIC FUNCTION PANEL
ALBUMIN: 4.1 g/dL (ref 3.5–5.2)
ALT: 15 U/L (ref 0–35)
AST: 15 U/L (ref 0–37)
Alkaline Phosphatase: 107 U/L (ref 39–117)
BILIRUBIN TOTAL: 0.9 mg/dL (ref 0.2–1.2)
Bilirubin, Direct: 0.1 mg/dL (ref 0.0–0.3)
Total Protein: 7.5 g/dL (ref 6.0–8.3)

## 2018-01-01 LAB — TSH: TSH: 0.21 u[IU]/mL — AB (ref 0.35–4.50)

## 2018-01-01 LAB — HEMOGLOBIN A1C: Hgb A1c MFr Bld: 8 % — ABNORMAL HIGH (ref 4.6–6.5)

## 2018-01-01 NOTE — Progress Notes (Signed)
   Subjective:    Patient ID: Amy [name], female    DOB: [birth year], 84 y.o.   MRN: [id number]  HPI Here to follow up on issues. She has been living at [facility] for a year and a half, and she is very happy there. She does miss being able to drive a car. She complains of frequent pain in the middle of her back, and we know she has some degenerative disc disease. Ibuprofen gives her reasonable relief. Her BP is stable. Her glucoses have been stable. She tries to take exercise classes or walk most every day.    Review of Systems  Constitutional: Negative.   HENT: Negative.   Eyes: Negative.   Respiratory: Negative.   Cardiovascular: Negative.   Gastrointestinal: Negative.   Genitourinary: Negative for decreased urine volume, difficulty urinating, dyspareunia, dysuria, enuresis, flank pain, frequency, hematuria, pelvic pain and urgency.  Musculoskeletal: Positive for back pain.  Skin: Negative.   Neurological: Negative.   Psychiatric/Behavioral: Negative.        Objective:   Physical Exam  Constitutional: She is oriented to person, place, and time. She appears well-developed and well-nourished. No distress.  HENT:  Head: Normocephalic and atraumatic.  Right Ear: External ear normal.  Left Ear: External ear normal.  Nose: Nose normal.  Mouth/Throat: Oropharynx is clear and moist. No oropharyngeal exudate.  Eyes: Pupils are equal, round, and reactive to light. Conjunctivae and EOM are normal. No scleral icterus.  Neck: Normal range of motion. Neck supple. No JVD present. No thyromegaly present.  Cardiovascular: Normal rate, regular rhythm, normal heart sounds and intact distal pulses. Exam reveals no gallop and no friction rub.  No murmur heard. Pulmonary/Chest: Effort normal and breath sounds normal. No respiratory distress. She has no wheezes. She has no rales. She exhibits no tenderness.  Abdominal: Soft. Bowel sounds are normal. She exhibits no distension and no mass.  There is no tenderness. There is no rebound and no guarding.  Musculoskeletal: Normal range of motion. She exhibits no edema or tenderness.  Lymphadenopathy:    She has no cervical adenopathy.  Neurological: She is alert and oriented to person, place, and time. She has normal reflexes. She displays normal reflexes. No cranial nerve deficit. She exhibits normal muscle tone. Coordination normal.  Skin: Skin is warm and dry. No rash noted. No erythema.  Psychiatric: She has a normal mood and affect. Her behavior is normal. Judgment and thought content normal.          Assessment & Plan:  Her back pain is stable and she will continue to use Ibuprofen prn. We will get fasting labs to check her lipids, etc, and an A1c to follow the diabetes. HTN is stable. Her depression is stable on Zoloft.  [name], MD

## 2018-01-05 ENCOUNTER — Other Ambulatory Visit: Payer: Self-pay

## 2018-01-05 ENCOUNTER — Telehealth: Payer: Self-pay | Admitting: Family Medicine

## 2018-01-05 MED ORDER — METFORMIN HCL 500 MG PO TABS: ORAL_TABLET | ORAL | 3 refills | Status: DC

## 2018-01-05 MED ORDER — SITAGLIPTIN PHOSPHATE 100 MG PO TABS: 100.0000 mg | ORAL_TABLET | Freq: Every day | ORAL | 3 refills | Status: DC

## 2018-01-05 NOTE — Telephone Encounter (Signed)
Copied from [place] [id number]. Topic: Quick Communication - See Telephone Encounter >> Jan 05, 2018  2:13 PM Vernona Rieger wrote: CRM for notification. See Telephone encounter for: 01/05/18.  Margarita Grizzle, RN with [facility] called and would like a copy of her new medication changes that were changed on 8/9 so that they can fill her medication box. They require an order prior to filling her medication box. Fax number [phone] attention: Margarita Grizzle, RN

## 2018-01-06 ENCOUNTER — Encounter: Payer: Self-pay | Admitting: Family Medicine

## 2018-01-06 NOTE — Telephone Encounter (Signed)
Mrya with [facility] calling to get a signed and faxed order so they can fill pt's medication box. Fax number:  [phone]  New or changed orders: Januvia 100mg  once daily Metformin 500mg  one tablet two times a day Glipizide 5mg  one tablet twice a day  Mrya is going to fax pt's MAR over so we can verify everything else.

## 2018-01-06 NOTE — Telephone Encounter (Signed)
Orders faxed and received confirmation the fax was successful.

## 2018-01-06 NOTE — Telephone Encounter (Signed)
Please fax this to them

## 2018-01-07 ENCOUNTER — Other Ambulatory Visit: Payer: Self-pay | Admitting: Family Medicine

## 2018-01-08 ENCOUNTER — Telehealth: Payer: Self-pay | Admitting: Family Medicine

## 2018-01-08 NOTE — Telephone Encounter (Signed)
I wrote a DC order so we can fax this to them

## 2018-01-08 NOTE — Telephone Encounter (Signed)
Sent to PCP to advise 

## 2018-01-08 NOTE — Telephone Encounter (Signed)
I have faxed orders as requested.

## 2018-01-08 NOTE — Telephone Encounter (Signed)
Copied from [place] [id number]. Topic: General - Other >> Jan 08, 2018 12:47 PM Yvette Rack wrote: Reason for CRM:  Caren Macadam with [facility] states the Rx for levETIRAcetam (KEPPRA) 500 MG tablet was left off the med list and they would need an order to discontinue. Cb# [phone]  Fax# [phone]

## 2018-01-21 ENCOUNTER — Other Ambulatory Visit: Payer: Self-pay

## 2018-01-21 ENCOUNTER — Other Ambulatory Visit: Payer: Self-pay | Admitting: Family Medicine

## 2018-01-21 MED ORDER — GABAPENTIN 300 MG PO CAPS: ORAL_CAPSULE | ORAL | 3 refills | Status: DC

## 2018-01-21 MED ORDER — GLIPIZIDE 5 MG PO TABS: ORAL_TABLET | ORAL | 3 refills | Status: DC

## 2018-01-26 DIAGNOSIS — M170 Bilateral primary osteoarthritis of knee: Secondary | ICD-10-CM | POA: Diagnosis not present

## 2018-01-26 DIAGNOSIS — G8929 Other chronic pain: Secondary | ICD-10-CM | POA: Diagnosis not present

## 2018-01-27 ENCOUNTER — Other Ambulatory Visit: Payer: Self-pay | Admitting: Family Medicine

## 2018-01-27 NOTE — Telephone Encounter (Signed)
Last OV 01/01/2018

## 2018-02-08 NOTE — Telephone Encounter (Signed)
Done

## 2018-03-10 ENCOUNTER — Ambulatory Visit (INDEPENDENT_AMBULATORY_CARE_PROVIDER_SITE_OTHER): Payer: Medicare HMO

## 2018-03-10 ENCOUNTER — Ambulatory Visit: Payer: Medicare HMO

## 2018-03-10 DIAGNOSIS — Z23 Encounter for immunization: Secondary | ICD-10-CM

## 2018-03-19 DIAGNOSIS — H6123 Impacted cerumen, bilateral: Secondary | ICD-10-CM | POA: Diagnosis not present

## 2018-03-19 DIAGNOSIS — H903 Sensorineural hearing loss, bilateral: Secondary | ICD-10-CM | POA: Diagnosis not present

## 2018-04-15 ENCOUNTER — Other Ambulatory Visit: Payer: Self-pay | Admitting: Family Medicine

## 2018-04-21 NOTE — Telephone Encounter (Signed)
Pharmacy calling to check on the status of refill. Yesterday was 3rd business day.

## 2018-04-28 ENCOUNTER — Other Ambulatory Visit: Payer: Self-pay

## 2018-04-28 ENCOUNTER — Emergency Department (HOSPITAL_COMMUNITY): Payer: Medicare HMO

## 2018-04-28 ENCOUNTER — Emergency Department (HOSPITAL_COMMUNITY)
Admission: EM | Admit: 2018-04-28 | Discharge: 2018-04-28 | Disposition: A | Discharge status: Home / Self Care | Payer: Medicare HMO | Attending: Emergency Medicine | Admitting: Emergency Medicine

## 2018-04-28 DIAGNOSIS — N3000 Acute cystitis without hematuria: Secondary | ICD-10-CM | POA: Diagnosis not present

## 2018-04-28 DIAGNOSIS — R5383 Other fatigue: Secondary | ICD-10-CM | POA: Diagnosis not present

## 2018-04-28 DIAGNOSIS — E119 Type 2 diabetes mellitus without complications: Secondary | ICD-10-CM | POA: Diagnosis not present

## 2018-04-28 DIAGNOSIS — Z85828 Personal history of other malignant neoplasm of skin: Secondary | ICD-10-CM | POA: Insufficient documentation

## 2018-04-28 DIAGNOSIS — R42 Dizziness and giddiness: Secondary | ICD-10-CM | POA: Diagnosis not present

## 2018-04-28 DIAGNOSIS — Z7984 Long term (current) use of oral hypoglycemic drugs: Secondary | ICD-10-CM | POA: Diagnosis not present

## 2018-04-28 DIAGNOSIS — E039 Hypothyroidism, unspecified: Secondary | ICD-10-CM | POA: Insufficient documentation

## 2018-04-28 DIAGNOSIS — R112 Nausea with vomiting, unspecified: Secondary | ICD-10-CM | POA: Diagnosis not present

## 2018-04-28 DIAGNOSIS — Z7982 Long term (current) use of aspirin: Secondary | ICD-10-CM | POA: Insufficient documentation

## 2018-04-28 DIAGNOSIS — Z79899 Other long term (current) drug therapy: Secondary | ICD-10-CM | POA: Diagnosis not present

## 2018-04-28 DIAGNOSIS — I10 Essential (primary) hypertension: Secondary | ICD-10-CM | POA: Diagnosis not present

## 2018-04-28 DIAGNOSIS — R1111 Vomiting without nausea: Secondary | ICD-10-CM | POA: Diagnosis not present

## 2018-04-28 DIAGNOSIS — Z8673 Personal history of transient ischemic attack (TIA), and cerebral infarction without residual deficits: Secondary | ICD-10-CM | POA: Diagnosis not present

## 2018-04-28 DIAGNOSIS — R110 Nausea: Secondary | ICD-10-CM | POA: Diagnosis not present

## 2018-04-28 LAB — COMPREHENSIVE METABOLIC PANEL
ALT: 16 U/L (ref 0–44)
AST: 14 U/L — ABNORMAL LOW (ref 15–41)
Albumin: 3.5 g/dL (ref 3.5–5.0)
Alkaline Phosphatase: 83 U/L (ref 38–126)
Anion gap: 9 (ref 5–15)
BUN: 28 mg/dL — ABNORMAL HIGH (ref 8–23)
CO2: 24 mmol/L (ref 22–32)
Calcium: 8.3 mg/dL — ABNORMAL LOW (ref 8.9–10.3)
Chloride: 107 mmol/L (ref 98–111)
Creatinine, Ser: 1.13 mg/dL — ABNORMAL HIGH (ref 0.44–1.00)
GFR calc non Af Amer: 44 mL/min — ABNORMAL LOW (ref >60)
GFR, EST AFRICAN AMERICAN: 51 mL/min — AB (ref >60)
Glucose, Bld: 181 mg/dL — ABNORMAL HIGH (ref 70–99)
Potassium: 4 mmol/L (ref 3.5–5.1)
Sodium: 140 mmol/L (ref 135–145)
Total Bilirubin: 0.7 mg/dL (ref 0.3–1.2)
Total Protein: 6.5 g/dL (ref 6.5–8.1)

## 2018-04-28 LAB — URINALYSIS, ROUTINE W REFLEX MICROSCOPIC
BILIRUBIN URINE: NEGATIVE
Bacteria, UA: NONE SEEN
Glucose, UA: NEGATIVE mg/dL
Hgb urine dipstick: NEGATIVE
Ketones, ur: NEGATIVE mg/dL
Nitrite: NEGATIVE
Protein, ur: NEGATIVE mg/dL
Specific Gravity, Urine: 1.016 (ref 1.005–1.030)
pH: 5 (ref 5.0–8.0)

## 2018-04-28 LAB — I-STAT TROPONIN, ED: Troponin i, poc: 0 ng/mL (ref 0.00–0.08)

## 2018-04-28 LAB — CBC
HCT: 35.2 % — ABNORMAL LOW (ref 36.0–46.0)
Hemoglobin: 11 g/dL — ABNORMAL LOW (ref 12.0–15.0)
MCH: 28.9 pg (ref 26.0–34.0)
MCHC: 31.3 g/dL (ref 30.0–36.0)
MCV: 92.6 fL (ref 80.0–100.0)
Platelets: 190 10*3/uL (ref 150–400)
RBC: 3.8 MIL/uL — ABNORMAL LOW (ref 3.87–5.11)
RDW: 13.3 % (ref 11.5–15.5)
WBC: 7.3 10*3/uL (ref 4.0–10.5)
nRBC: 0 % (ref 0.0–0.2)

## 2018-04-28 LAB — LIPASE, BLOOD: Lipase: 50 U/L (ref 11–51)

## 2018-04-28 MED ORDER — SODIUM CHLORIDE 0.9 % IV SOLN
1.0000 g | Freq: Once | INTRAVENOUS | Status: AC
  Administered 2018-04-28: 1 g via INTRAVENOUS
  Filled 2018-04-28: qty 10

## 2018-04-28 MED ORDER — AMLODIPINE BESYLATE 5 MG PO TABS
5.0000 mg | ORAL_TABLET | Freq: Once | ORAL | Status: AC
  Administered 2018-04-28: 5 mg via ORAL
  Filled 2018-04-28: qty 1

## 2018-04-28 MED ORDER — AMLODIPINE BESYLATE 5 MG PO TABS: 5.0000 mg | ORAL_TABLET | Freq: Two times a day (BID) | ORAL | 0 refills | Status: DC

## 2018-04-28 MED ORDER — SODIUM CHLORIDE 0.9 % IV SOLN
Freq: Once | INTRAVENOUS | Status: AC
  Administered 2018-04-28: 21:00:00 via INTRAVENOUS

## 2018-04-28 MED ORDER — MECLIZINE HCL 25 MG PO TABS
25.0000 mg | ORAL_TABLET | Freq: Once | ORAL | Status: AC
  Administered 2018-04-28: 25 mg via ORAL
  Filled 2018-04-28: qty 1

## 2018-04-28 MED ORDER — MECLIZINE HCL 25 MG PO TABS: 25.0000 mg | ORAL_TABLET | Freq: Three times a day (TID) | ORAL | 0 refills | Status: AC | PRN

## 2018-04-28 MED ORDER — SODIUM CHLORIDE 0.9 % IV BOLUS
500.0000 mL | Freq: Once | INTRAVENOUS | Status: AC
  Administered 2018-04-28: 500 mL via INTRAVENOUS

## 2018-04-28 MED ORDER — CEPHALEXIN 500 MG PO CAPS: 1000.0000 mg | ORAL_CAPSULE | Freq: Two times a day (BID) | ORAL | 0 refills | Status: DC

## 2018-04-28 NOTE — ED Notes (Signed)
Bed: MA26 Expected date:  Expected time:  Means of arrival:  Comments: EMS/dizzy

## 2018-04-28 NOTE — Discharge Instructions (Addendum)
1.  Call your neurologist tomorrow to schedule follow-up appointment. 2.  If you have dizziness that is similar to vertigo, you may take Antivert as prescribed. 3.  Your urine tests positive for urinary tract infection.  Take Keflex as prescribed.  Return to the emergency department if you develop fever vomiting or worsening symptoms. 4.  Return to the emergency department if you develop problems with your vision such as double vision or blurred vision, headache, loss of balance, weakness numbness or tingling of extremities or other symptoms concerning for stroke. 5.  Increase your amlodipine to 5 mg 2 times a day.  Call your family doctor to review this change as soon as possible.  Monitor your blood pressures at home 3 times a day.

## 2018-04-28 NOTE — ED Triage Notes (Signed)
EMS Reports pt is coming from an independent living facility ([facility]). Pt reportedly has had a few episodes of emesis and has felt dizzy. Pt reports she has nausea and has not been wanting to eat.

## 2018-04-28 NOTE — ED Notes (Signed)
Pt assisted to restroom without difficulty.  

## 2018-04-28 NOTE — ED Provider Notes (Signed)
[facility] DEPT Provider Note   CSN: [id number] Arrival date & time: 04/28/18  1827     History   Chief Complaint Chief Complaint  Patient presents with  . Dizziness  . Emesis  . Nausea    HPI Amy [name] is a 84 y.o. female.  HPI Patient was at the dining room table at her assisted living.  She reports that she had ordered her meal when she suddenly became intensely dizzy.  She reports that everything spun for a brief period of time and then she became nauseated and vomited copiously.  She denies she had any changes in her vision.  She denies she had any headache.  She reports that the dizziness abated but she still feels fatigued.  She did not note any weakness numbness or tingling of her extremities.  She reports that she has felt somewhat fatigued and not well for the past 4 days.  No other specific or localizing symptoms.  She denies that she has been experiencing any chest pain, palpitations, cough or shortness of breath.  She has not had headaches over these past several days.  No vomiting or diarrhea.  Patient reports she has a history of vertigo but she denies that this felt like her more typical vertigo events.  She reports ever since a head injury from fall, she can get dizzy if she lays back and tilts her head to the right.  She reports she does not do that because she knows that it triggers her symptoms.  At the time that symptoms occurred she was in upright position and having normal interactions and asymptomatic. Past Medical History:  Diagnosis Date  . ABSCESS 12/18/2009  . DIABETES MELLITUS, TYPE II 05/22/2009  . Diverticulosis   . Hemorrhoids   . HYPERGLYCEMIA 05/22/2009  . HYPERTENSION 04/13/2007  . HYPOTHYROIDISM 04/13/2007  . NEOPLASM, SKIN, UNCERTAIN BEHAVIOR 09/17/9561  . Stroke ([place])   . Tubular adenoma of colon 02/2001    Patient Active Problem List   Diagnosis Date Noted  . 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 Cerebral thrombosis with cerebral infarction 04/23/2016  . Left homonymous hemianopsia 04/22/2016  . Dizziness and giddiness 07/27/2015  . Balance disorder 07/27/2015  . Depression 03/12/2015  . Abdominal pain, generalized 01/30/2015  . LUQ abdominal pain 08/19/2014  . Personal history of colonic polyps 06/18/2011  . NEOPLASM, SKIN, UNCERTAIN BEHAVIOR 87/56/4332  . Diabetes mellitus without complication ([place]) 95/18/8416  . Hypothyroidism 04/13/2007  . Essential hypertension 04/13/2007    Past Surgical History:  Procedure Laterality Date  . ABDOMINAL HYSTERECTOMY    . APPENDECTOMY    . CHOLECYSTECTOMY    . COLONOSCOPY  04-05-04   per Dr. Deatra Ina, no polyps   . OOPHORECTOMY    . THYROIDECTOMY     secondary to nodule, no cancer  . TONSILLECTOMY AND ADENOIDECTOMY       OB History   None      Home Medications    Prior to Admission medications   Medication Sig Start Date End Date Taking? Authorizing Provider  amLODipine (NORVASC) 5 MG tablet TAKE 1 TABLET ONCE DAILY. 01/27/18  Yes Laurey Morale, MD  aspirin 81 MG tablet Take 1 tablet (81 mg total) by mouth daily. 05/12/16  Yes Laurey Morale, MD  atorvastatin (LIPITOR) 40 MG tablet TAKE 1 TABLET ONCE DAILY. 04/21/18  Yes Laurey Morale, MD  docusate sodium (COLACE) 100 MG capsule Take 1 capsule (100 mg  total) by mouth 2 (two) times daily. 05/07/16  Yes Angiulli, Lavon Paganini, PA-C  gabapentin (NEURONTIN) 300 MG capsule TAKE (1) CAPSULE TWICE DAILY. 01/21/18  Yes Laurey Morale, MD  glipiZIDE (GLUCOTROL) 5 MG tablet TAKE (1) TABLET TWICE A DAY BEFORE MEALS. 01/21/18  Yes Laurey Morale, MD  ibuprofen (ADVIL,MOTRIN) 200 MG tablet Take 400 mg by mouth at bedtime as needed (RLS).   Yes [provider]  levothyroxine (SYNTHROID, LEVOTHROID) 175 MCG tablet TAKE 1 TABLET BEFORE BREAKFAST. Patient taking differently: Take 175 mcg by mouth daily before breakfast.  01/27/18  Yes Laurey Morale, MD    metFORMIN (GLUCOPHAGE) 500 MG tablet Take 1 tablet by mouth 2 times a day 01/05/18  Yes Laurey Morale, MD  Multiple Vitamin (MULTIVITAMIN WITH MINERALS) TABS tablet Take 1 tablet by mouth daily.   Yes [provider]  sertraline (ZOLOFT) 50 MG tablet TAKE 1 TABLET ONCE DAILY. 01/27/18  Yes Laurey Morale, MD  sitaGLIPtin (JANUVIA) 100 MG tablet Take 1 tablet (100 mg total) by mouth daily. 01/05/18  Yes Laurey Morale, MD  cephALEXin (KEFLEX) 500 MG capsule Take 2 capsules (1,000 mg total) by mouth 2 (two) times daily. 04/28/18   Charlesetta Shanks, MD  meclizine (ANTIVERT) 25 MG tablet Take 1 tablet (25 mg total) by mouth 3 (three) times daily as needed for dizziness. 04/28/18   Charlesetta Shanks, MD  omeprazole (PRILOSEC) 40 MG capsule TAKE (1) CAPSULE DAILY. Patient not taking: Reported on 04/28/2018 01/27/18   Laurey Morale, MD    Family History Family History  Problem Relation Age of Onset  . Stroke Father   . Hypertension Mother   . Stroke Mother   . Huntington's disease Daughter 71  . Colon cancer Neg Hx     Social History Social History   Tobacco Use  . Smoking status: Never Smoker  . Smokeless tobacco: Never Used  Substance Use Topics  . Alcohol use: No    Alcohol/week: 0.0 standard drinks  . Drug use: No     Allergies   Ativan [lorazepam]; Diclofenac sodium; Feldene [piroxicam]; Latex; and Tape   Review of Systems Review of Systems 10 Systems reviewed and are negative for acute change except as noted in the HPI.   Physical Exam Updated Vital Signs BP (!) 173/88   Pulse 68   Temp 97.9 F (36.6 C) (Oral)   Resp 14   Ht 5\' 7"  (1.702 m)   Wt 77.1 kg   SpO2 100%   BMI 26.63 kg/m   Physical Exam  Constitutional: She is oriented to person, place, and time. She appears well-developed and well-nourished. No distress.  Patient is in excellent physical condition for age.  She is nontoxic and alert.  No respiratory distress.  HENT:  Head: Normocephalic and  atraumatic.  Bilateral TMs normal.  Mucous membranes pink and moist.  Posterior oropharynx widely patent.  Eyes: Pupils are equal, round, and reactive to light. Conjunctivae and EOM are normal.  Neck: Neck supple.  Cardiovascular: Normal rate, regular rhythm, normal heart sounds and intact distal pulses.  Pulmonary/Chest: Effort normal and breath sounds normal.  Abdominal: Soft. She exhibits no distension. There is no tenderness. There is no guarding.  Musculoskeletal: Normal range of motion. She exhibits no edema or tenderness.  Neurological: She is alert and oriented to person, place, and time. No cranial nerve deficit or sensory deficit. She exhibits normal muscle tone. Coordination normal.  Patient is mental status is clear.  Speech is clear.  Cognitive function is normal.  Extraocular motions are normal without nystagmus.  Pupils are symmetric and responsive.  No pronator drift.  Motor strength upper and lower extremities are 5\5.  Sensation normal to light touch.  Finger-nose exam normal.  Skin: Skin is warm and dry.  Psychiatric: She has a normal mood and affect.     ED Treatments / Results  Labs (all labs ordered are listed, but only abnormal results are displayed) Labs Reviewed  COMPREHENSIVE METABOLIC PANEL - Abnormal; Notable for the following components:      Result Value   Glucose, Bld 181 (*)    BUN 28 (*)    Creatinine, Ser 1.13 (*)    Calcium 8.3 (*)    AST 14 (*)    GFR calc non Af Amer 44 (*)    GFR calc Af Amer 51 (*)    All other components within normal limits  CBC - Abnormal; Notable for the following components:   RBC 3.80 (*)    Hemoglobin 11.0 (*)    HCT 35.2 (*)    All other components within normal limits  URINALYSIS, ROUTINE W REFLEX MICROSCOPIC - Abnormal; Notable for the following components:   Leukocytes, UA SMALL (*)    All other components within normal limits  URINE CULTURE  LIPASE, BLOOD  I-STAT TROPONIN, ED    EKG None  Radiology Ct  Head Wo Contrast  Result Date: 04/28/2018 CLINICAL DATA:  Dizziness with emesis EXAM: CT HEAD WITHOUT CONTRAST TECHNIQUE: Contiguous axial images were obtained from the base of the skull through the vertex without intravenous contrast. COMPARISON:  Head CT 04/22/2016 FINDINGS: Brain: Age related involutional changes of the brain. Mild sulcal mild-to-moderate ventricular prominence is noted as before. Moderate degree of chronic small vessel ischemia is noted in the periventricular subcortical white matter. No acute intracranial hemorrhage, mass, edema or midline shift. No extra-axial fluid collection. Midline fourth ventricle basal cisterns without effacement. Brainstem and cerebellum appear nonacute. Vascular: Atherosclerosis of the skull base. No hyperdense vessel sign. Skull: Intact Sinuses/Orbits: No acute finding. Other: None IMPRESSION: Chronic moderate small vessel ischemia. Age related involutional changes of the brain. No acute intracranial abnormality. Electronically Signed   By: Ashley Royalty M.D.   On: 04/28/2018 19:58    Procedures Procedures (including critical care time)  Medications Ordered in ED Medications  sodium chloride 0.9 % bolus 500 mL (0 mLs Intravenous Stopped 04/28/18 2129)  0.9 %  sodium chloride infusion ( Intravenous New Bag/Given 04/28/18 2058)  meclizine (ANTIVERT) tablet 25 mg (25 mg Oral Given 04/28/18 2059)  cefTRIAXone (ROCEPHIN) 1 g in sodium chloride 0.9 % 100 mL IVPB (1 g Intravenous New Bag/Given 04/28/18 2141)  amLODipine (NORVASC) tablet 5 mg (5 mg Oral Given 04/28/18 2153)     Initial Impression / Assessment and Plan / ED Course  I have reviewed the triage vital signs and the nursing notes.  Pertinent labs & imaging results that were available during my care of the patient were reviewed by me and considered in my medical decision making (see chart for details).     Patient has sudden onset of vertiginous dizziness at her assisted living.  She had a spinning  quality of symptoms without associated visual changes.  She had no other neurologic changes.  She had no headache.  She did vomit quite suddenly after it started.  The dizziness has resolved.  Patient does not have any subsequent neurologic deficits and normal exam.  She had  does report some general malaise and fatigue for several days duration, she reports she just been feeling a little sick with no specific symptoms.  Urinalysis does test positive for leuk esterase and leukocytes.  Given patient's constitutional symptoms will opt to treat with 1 dose of Rocephin in the emergency department and Keflex as an outpatient.  Patient has prior history of stroke but at this time her presentation is more suggestive of vertigo.  Patient is already taking a daily aspirin which she will continue.  Blood pressures were moderately elevated.  She however did not have signs of endorgan damage.  Patient's amlodipine which is 5 mg will be increased to amlodipine 5 mg twice daily.  She is here with her family members.  Strict return precautions are reviewed. Final Clinical Impressions(s) / ED Diagnoses   Final diagnoses:  Vertigo  Non-intractable vomiting with nausea, unspecified vomiting type  Acute cystitis without hematuria    ED Discharge Orders         Ordered    cephALEXin (KEFLEX) 500 MG capsule  2 times daily     04/28/18 2215    meclizine (ANTIVERT) 25 MG tablet  3 times daily PRN     04/28/18 2215           Charlesetta Shanks, MD 04/28/18 2222

## 2018-04-30 LAB — URINE CULTURE: Culture: <10000 — AB

## 2018-05-20 ENCOUNTER — Other Ambulatory Visit: Payer: Self-pay | Admitting: Family Medicine

## 2018-05-31 ENCOUNTER — Ambulatory Visit (INDEPENDENT_AMBULATORY_CARE_PROVIDER_SITE_OTHER): Payer: Medicare HMO | Admitting: Family Medicine

## 2018-05-31 ENCOUNTER — Encounter: Payer: Self-pay | Admitting: Family Medicine

## 2018-05-31 VITALS — BP 144/82 | HR 75 | Temp 98.4°F

## 2018-05-31 DIAGNOSIS — R42 Dizziness and giddiness: Secondary | ICD-10-CM

## 2018-05-31 DIAGNOSIS — R350 Frequency of micturition: Secondary | ICD-10-CM | POA: Diagnosis not present

## 2018-05-31 LAB — POCT URINALYSIS DIPSTICK
Blood, UA: NEGATIVE
Glucose, UA: NEGATIVE
Ketones, UA: NEGATIVE
Leukocytes, UA: NEGATIVE
NITRITE UA: NEGATIVE
Protein, UA: POSITIVE — AB
Spec Grav, UA: 1.025 (ref 1.010–1.025)
Urobilinogen, UA: 0.2 E.U./dL
pH, UA: 5.5 (ref 5.0–8.0)

## 2018-05-31 NOTE — Progress Notes (Signed)
   Subjective:    Patient ID: Amy [name], female    DOB: Jul 18, 1929, 85 y.o.   MRN: [id number]  HPI Here to follow up an ER visit on 04-28-18 for the acute onset of dizziness with nausea and vomiting. She had no fever or urinary symptoms. Her exam was fairly normal but a UA seemed to indicate an infection. She was diagnosed with vertigo and with a UTI. She was treated with Meclizine and Keflex, and she quickly recovered. Her urine culture was negative however. She has felt fine since then with no more dizziness, but she does have some frequency of urine. No burning or urgency.    Review of Systems  Constitutional: Negative.   Respiratory: Negative.   Cardiovascular: Negative.   Gastrointestinal: Negative.   Genitourinary: Positive for frequency. Negative for dysuria, flank pain, hematuria and urgency.  Neurological: Negative.        Objective:   Physical Exam Constitutional:      General: She is not in acute distress.    Appearance: Normal appearance. She is not ill-appearing.  Cardiovascular:     Rate and Rhythm: Normal rate and regular rhythm.     Pulses: Normal pulses.     Heart sounds: Normal heart sounds.  Pulmonary:     Effort: Pulmonary effort is normal.     Breath sounds: Normal breath sounds.  Abdominal:     General: Abdomen is flat. Bowel sounds are normal. There is no distension.     Palpations: Abdomen is soft. There is no mass.     Tenderness: There is no abdominal tenderness. There is no guarding or rebound.     Hernia: No hernia is present.  Neurological:     General: No focal deficit present.     Mental Status: She is alert and oriented to person, place, and time.           Assessment & Plan:  She has no evidence of a UTI today. I reminded her to drink plenty of water. No more episodes of vertigo. She will set up a complete exam with labs in the near future.  [name], MD

## 2018-05-31 NOTE — Addendum Note (Signed)
Addended by: Elie Confer on: 05/31/2018 01:35 PM   Modules accepted: Orders

## 2018-06-14 ENCOUNTER — Other Ambulatory Visit: Payer: Self-pay | Admitting: Family Medicine

## 2018-07-12 ENCOUNTER — Other Ambulatory Visit: Payer: Self-pay | Admitting: Family Medicine

## 2018-07-20 DIAGNOSIS — H5203 Hypermetropia, bilateral: Secondary | ICD-10-CM | POA: Diagnosis not present

## 2018-07-20 DIAGNOSIS — H524 Presbyopia: Secondary | ICD-10-CM | POA: Diagnosis not present

## 2018-07-20 DIAGNOSIS — Z0100 Encounter for examination of eyes and vision without abnormal findings: Secondary | ICD-10-CM | POA: Diagnosis not present

## 2018-07-20 DIAGNOSIS — H52223 Regular astigmatism, bilateral: Secondary | ICD-10-CM | POA: Diagnosis not present

## 2018-08-13 ENCOUNTER — Other Ambulatory Visit: Payer: Self-pay | Admitting: Family Medicine

## 2018-09-10 ENCOUNTER — Other Ambulatory Visit: Payer: Self-pay | Admitting: Family Medicine

## 2018-10-06 ENCOUNTER — Other Ambulatory Visit: Payer: Self-pay | Admitting: Family Medicine

## 2018-11-01 ENCOUNTER — Other Ambulatory Visit: Payer: Self-pay | Admitting: Family Medicine

## 2018-11-05 ENCOUNTER — Other Ambulatory Visit: Payer: Self-pay

## 2018-11-05 ENCOUNTER — Ambulatory Visit (INDEPENDENT_AMBULATORY_CARE_PROVIDER_SITE_OTHER): Payer: Medicare HMO | Admitting: Family Medicine

## 2018-11-05 ENCOUNTER — Encounter: Payer: Self-pay | Admitting: Family Medicine

## 2018-11-05 ENCOUNTER — Telehealth: Payer: Self-pay

## 2018-11-05 DIAGNOSIS — Z20828 Contact with and (suspected) exposure to other viral communicable diseases: Secondary | ICD-10-CM | POA: Diagnosis not present

## 2018-11-05 DIAGNOSIS — Z209 Contact with and (suspected) exposure to unspecified communicable disease: Secondary | ICD-10-CM

## 2018-11-05 NOTE — Telephone Encounter (Signed)
Left second VM to CB for testing.

## 2018-11-05 NOTE — Telephone Encounter (Signed)
-----   Message from Elie Confer, [place] sent at 11/05/2018  3:04 PM EDT ----- Dr. Sarajane Jews would like to have the patient tested for covid 19.  She is going back to the facility that she lives in but has to have the covid test done and negative results before they will let her return.  She will be in [place] on Monday is this can be done at that time.   Thank you!!!

## 2018-11-05 NOTE — Progress Notes (Signed)
Subjective:    Patient ID: Amy [name], female    DOB: June 15, 1929, 85 y.o.   MRN: [id number]  HPI Virtual Visit via Video Note  I connected with the patient on 11/08/18 at  2:15 PM EDT by a video enabled telemedicine application and verified that I am speaking with the correct person using two identifiers.  Location patient: home Location provider:work or home office Persons participating in the virtual visit: patient, provider  I discussed the limitations of evaluation and management by telemedicine and the availability of in person appointments. The patient expressed understanding and agreed to proceed.   HPI: Here with her daughter asking about a test for the Covid-19 virus. Amy [name] lives at an assisted living facility which has not been allowing the residents to have any visitors. They did grant Amy [name] an exception and allowed her to visit her daughter for a week because Amy [name] has been struggling with some depression. The visit has been for helpful to her and she is now ready to return to the facility. However the facility will not accept her back until she tests negative for the virus. They are asking for my assistance. [name] feels well.    ROS: See pertinent positives and negatives per HPI.  Past Medical History:  Diagnosis Date  . ABSCESS 12/18/2009  . DIABETES MELLITUS, TYPE II 05/22/2009  . Diverticulosis   . Hemorrhoids   . HYPERGLYCEMIA 05/22/2009  . HYPERTENSION 04/13/2007  . HYPOTHYROIDISM 04/13/2007  . NEOPLASM, SKIN, UNCERTAIN BEHAVIOR 08/16/5571  . Stroke ([place])   . Tubular adenoma of colon 02/2001    Past Surgical History:  Procedure Laterality Date  . ABDOMINAL HYSTERECTOMY    . APPENDECTOMY    . CHOLECYSTECTOMY    . COLONOSCOPY  04-05-04   per Dr. Deatra Ina, no polyps   . OOPHORECTOMY    . THYROIDECTOMY     secondary to nodule, no cancer  . TONSILLECTOMY AND ADENOIDECTOMY      Family History  Problem Relation Age of Onset  . Stroke Father   .  Hypertension Mother   . Stroke Mother   . Huntington's disease Daughter 45  . Colon cancer Neg Hx      Current Outpatient Medications:  .  amLODipine (NORVASC) 5 MG tablet, TAKE 1 TABLET BY MOUTH TWICE DAILY., Disp: 56 tablet, Rfl: 5 .  aspirin 81 MG tablet, Take 1 tablet (81 mg total) by mouth daily., Disp: 30 tablet, Rfl: 0 .  atorvastatin (LIPITOR) 40 MG tablet, TAKE 1 TABLET ONCE DAILY., Disp: 28 tablet, Rfl: 0 .  docusate sodium (COLACE) 100 MG capsule, Take 1 capsule (100 mg total) by mouth 2 (two) times daily., Disp: 10 capsule, Rfl: 0 .  gabapentin (NEURONTIN) 300 MG capsule, TAKE (1) CAPSULE TWICE DAILY., Disp: 56 capsule, Rfl: 5 .  glipiZIDE (GLUCOTROL) 5 MG tablet, TAKE (1) TABLET TWICE A DAY BEFORE MEALS., Disp: 56 tablet, Rfl: 5 .  ibuprofen (ADVIL,MOTRIN) 200 MG tablet, Take 400 mg by mouth at bedtime as needed (RLS)., Disp: , Rfl:  .  levothyroxine (SYNTHROID, LEVOTHROID) 175 MCG tablet, TAKE 1 TABLET BEFORE BREAKFAST. (Patient taking differently: Take 175 mcg by mouth daily before breakfast. ), Disp: 90 tablet, Rfl: 3 .  meclizine (ANTIVERT) 25 MG tablet, Take 1 tablet (25 mg total) by mouth 3 (three) times daily as needed for dizziness., Disp: 30 tablet, Rfl: 0 .  metFORMIN (GLUCOPHAGE) 500 MG tablet, Take 1 tablet by mouth 2 times a day, Disp: 270 tablet, Rfl: 3 .  Multiple Vitamin (MULTIVITAMIN WITH MINERALS) TABS tablet, Take 1 tablet by mouth daily., Disp: , Rfl:  .  omeprazole (PRILOSEC) 40 MG capsule, TAKE (1) CAPSULE DAILY., Disp: 90 capsule, Rfl: 3 .  sertraline (ZOLOFT) 50 MG tablet, TAKE 1 TABLET ONCE DAILY., Disp: 28 tablet, Rfl: 5 .  sitaGLIPtin (JANUVIA) 100 MG tablet, Take 1 tablet (100 mg total) by mouth daily., Disp: 90 tablet, Rfl: 3  EXAM:  VITALS per patient if applicable:  GENERAL: alert, oriented, appears well and in no acute distress  HEENT: atraumatic, conjunttiva clear, no obvious abnormalities on inspection of external nose and ears  NECK:  normal movements of the head and neck  LUNGS: on inspection no signs of respiratory distress, breathing rate appears normal, no obvious gross SOB, gasping or wheezing  CV: no obvious cyanosis  MS: moves all visible extremities without noticeable abnormality  PSYCH/NEURO: pleasant and cooperative, no obvious depression or anxiety, speech and thought processing grossly intact  ASSESSMENT AND PLAN: We will send her for a Covid-19 test on Monday so she can return to her facility. [name], MD  Discussed the following assessment and plan:  No diagnosis found.     I discussed the assessment and treatment plan with the patient. The patient was provided an opportunity to ask questions and all were answered. The patient agreed with the plan and demonstrated an understanding of the instructions.   The patient was advised to call back or seek an in-person evaluation if the symptoms worsen or if the condition fails to improve as anticipated.     Review of Systems     Objective:   Physical Exam        Assessment & Plan:

## 2018-11-05 NOTE — Telephone Encounter (Signed)
Call placed to patient. Left VM to return call to [phone]

## 2018-11-08 DIAGNOSIS — Z20828 Contact with and (suspected) exposure to other viral communicable diseases: Secondary | ICD-10-CM | POA: Diagnosis not present

## 2018-11-09 NOTE — Telephone Encounter (Signed)
Patient called to say that she has been tested and she is doing well- she was out of town and just got back and got messages. She is awaiting her results now.

## 2018-12-02 ENCOUNTER — Other Ambulatory Visit: Payer: Self-pay | Admitting: Family Medicine

## 2018-12-08 NOTE — Telephone Encounter (Signed)
Raquel Sarna with [facility] called to request approval to refill the medications. Attempted to call th e office but there was no answer.

## 2018-12-27 ENCOUNTER — Other Ambulatory Visit: Payer: Self-pay | Admitting: Family Medicine

## 2019-01-25 ENCOUNTER — Other Ambulatory Visit: Payer: Self-pay

## 2019-01-25 ENCOUNTER — Ambulatory Visit (INDEPENDENT_AMBULATORY_CARE_PROVIDER_SITE_OTHER): Payer: Medicare HMO | Admitting: Family Medicine

## 2019-01-25 ENCOUNTER — Encounter: Payer: Self-pay | Admitting: Family Medicine

## 2019-01-25 VITALS — BP 130/70 | HR 97 | Temp 97.7°F

## 2019-01-25 DIAGNOSIS — I10 Essential (primary) hypertension: Secondary | ICD-10-CM | POA: Diagnosis not present

## 2019-01-25 DIAGNOSIS — F329 Major depressive disorder, single episode, unspecified: Secondary | ICD-10-CM

## 2019-01-25 DIAGNOSIS — E119 Type 2 diabetes mellitus without complications: Secondary | ICD-10-CM

## 2019-01-25 DIAGNOSIS — R413 Other amnesia: Secondary | ICD-10-CM

## 2019-01-25 DIAGNOSIS — F32A Depression, unspecified: Secondary | ICD-10-CM

## 2019-01-25 DIAGNOSIS — E039 Hypothyroidism, unspecified: Secondary | ICD-10-CM | POA: Diagnosis not present

## 2019-01-25 DIAGNOSIS — R69 Illness, unspecified: Secondary | ICD-10-CM | POA: Diagnosis not present

## 2019-01-25 LAB — CBC WITH DIFFERENTIAL/PLATELET
Basophils Absolute: 0 10*3/uL (ref 0.0–0.1)
Basophils Relative: 0.2 % (ref 0.0–3.0)
Eosinophils Absolute: 0.1 10*3/uL (ref 0.0–0.7)
Eosinophils Relative: 1.4 % (ref 0.0–5.0)
HCT: 36.1 % (ref 36.0–46.0)
Hemoglobin: 12 g/dL (ref 12.0–15.0)
Lymphocytes Relative: 23.4 % (ref 12.0–46.0)
Lymphs Abs: 2 10*3/uL (ref 0.7–4.0)
MCHC: 33.1 g/dL (ref 30.0–36.0)
MCV: 89.1 fl (ref 78.0–100.0)
Monocytes Absolute: 0.6 10*3/uL (ref 0.1–1.0)
Monocytes Relative: 7 % (ref 3.0–12.0)
Neutro Abs: 5.8 10*3/uL (ref 1.4–7.7)
Neutrophils Relative %: 68 % (ref 43.0–77.0)
Platelets: 198 10*3/uL (ref 150.0–400.0)
RBC: 4.06 Mil/uL (ref 3.87–5.11)
RDW: 14.1 % (ref 11.5–15.5)
WBC: 8.5 10*3/uL (ref 4.0–10.5)

## 2019-01-25 LAB — T3, FREE: T3, Free: 3 pg/mL (ref 2.3–4.2)

## 2019-01-25 LAB — BASIC METABOLIC PANEL
BUN: 21 mg/dL (ref 6–23)
CO2: 27 mEq/L (ref 19–32)
Calcium: 9.3 mg/dL (ref 8.4–10.5)
Chloride: 103 mEq/L (ref 96–112)
Creatinine, Ser: 0.86 mg/dL (ref 0.40–1.20)
GFR: 62.41 mL/min (ref >60.00)
Glucose, Bld: 113 mg/dL — ABNORMAL HIGH (ref 70–99)
Potassium: 3.8 mEq/L (ref 3.5–5.1)
Sodium: 139 mEq/L (ref 135–145)

## 2019-01-25 LAB — LIPID PANEL
Cholesterol: 122 mg/dL (ref 0–200)
HDL: 51.8 mg/dL (ref >39.00)
LDL Cholesterol: 47 mg/dL (ref 0–99)
NonHDL: 70.54
Total CHOL/HDL Ratio: 2
Triglycerides: 116 mg/dL (ref 0.0–149.0)
VLDL: 23.2 mg/dL (ref 0.0–40.0)

## 2019-01-25 LAB — HEPATIC FUNCTION PANEL
ALT: 13 U/L (ref 0–35)
AST: 14 U/L (ref 0–37)
Albumin: 4.1 g/dL (ref 3.5–5.2)
Alkaline Phosphatase: 101 U/L (ref 39–117)
Bilirubin, Direct: 0.2 mg/dL (ref 0.0–0.3)
Total Bilirubin: 0.9 mg/dL (ref 0.2–1.2)
Total Protein: 7.3 g/dL (ref 6.0–8.3)

## 2019-01-25 LAB — TSH: TSH: 0.03 u[IU]/mL — ABNORMAL LOW (ref 0.35–4.50)

## 2019-01-25 LAB — T4, FREE: Free T4: 1.21 ng/dL (ref 0.60–1.60)

## 2019-01-25 LAB — HEMOGLOBIN A1C: Hgb A1c MFr Bld: 6.5 % (ref 4.6–6.5)

## 2019-01-25 MED ORDER — METFORMIN HCL 500 MG PO TABS: ORAL_TABLET | ORAL | 3 refills | Status: DC

## 2019-01-25 MED ORDER — DONEPEZIL HCL 5 MG PO TABS: 5.0000 mg | ORAL_TABLET | Freq: Every day | ORAL | 2 refills | Status: DC

## 2019-01-25 MED ORDER — LEVOTHYROXINE SODIUM 175 MCG PO TABS: ORAL_TABLET | ORAL | 3 refills | Status: DC

## 2019-01-25 NOTE — Progress Notes (Signed)
Subjective:    Patient ID: Amy [name], female    DOB: Jun 25, 1929, 85 y.o.   MRN: [id number]  HPI Here to follow up on issues. She feels well in general, though she has some pain in the spine and the knees at times. This usually responds to taking Ibuprofen. She sleeps well and has a good appetite. Her BP has been stable. Her fasting glucose this morning was 116. She does ask about something to help her memory. She has some trouble remembering names and places at times. This has not caused a problem but she finds it frustrating.    Review of Systems  Constitutional: Negative.   HENT: Negative.   Eyes: Negative.   Respiratory: Negative.   Cardiovascular: Negative.   Gastrointestinal: Negative.   Genitourinary: Negative for decreased urine volume, difficulty urinating, dyspareunia, dysuria, enuresis, flank pain, frequency, hematuria, pelvic pain and urgency.  Musculoskeletal: Positive for arthralgias and back pain.  Skin: Negative.   Neurological: Negative.   Psychiatric/Behavioral: Negative.        Objective:   Physical Exam Constitutional:      General: She is not in acute distress.    Appearance: She is well-developed.  HENT:     Head: Normocephalic and atraumatic.     Right Ear: External ear normal.     Left Ear: External ear normal.     Nose: Nose normal.     Mouth/Throat:     Pharynx: No oropharyngeal exudate.  Eyes:     General: No scleral icterus.    Conjunctiva/sclera: Conjunctivae normal.     Pupils: Pupils are equal, round, and reactive to light.  Neck:     Musculoskeletal: Normal range of motion and neck supple.     Thyroid: No thyromegaly.     Vascular: No JVD.  Cardiovascular:     Rate and Rhythm: Normal rate and regular rhythm.     Heart sounds: Normal heart sounds. No murmur. No friction rub. No gallop.   Pulmonary:     Effort: Pulmonary effort is normal. No respiratory distress.     Breath sounds: Normal breath sounds. No wheezing or rales.   Chest:     Chest wall: No tenderness.  Abdominal:     General: Bowel sounds are normal. There is no distension.     Palpations: Abdomen is soft. There is no mass.     Tenderness: There is no abdominal tenderness. There is no guarding or rebound.  Musculoskeletal: Normal range of motion.        General: No tenderness.  Lymphadenopathy:     Cervical: No cervical adenopathy.  Skin:    General: Skin is warm and dry.     Findings: No erythema or rash.  Neurological:     Mental Status: She is alert and oriented to person, place, and time.     Cranial Nerves: No cranial nerve deficit.     Motor: No abnormal muscle tone.     Coordination: Coordination normal.     Deep Tendon Reflexes: Reflexes are normal and symmetric. Reflexes normal.  Psychiatric:        Behavior: Behavior normal.        Thought Content: Thought content normal.        Judgment: Judgment normal.           Assessment & Plan:  Her HTN is stable. We will get fasting labs to check an A1c, etc. We will check a thyroid panel. She will continue to use Ibuprofen  as needed for arthritis pain. She will try Aricept 5 mg daily for the memory, and she will report back to [place] in 30 days.  [name], MD

## 2019-01-27 ENCOUNTER — Other Ambulatory Visit: Payer: Self-pay | Admitting: Family Medicine

## 2019-02-01 DIAGNOSIS — Z03818 Encounter for observation for suspected exposure to other biological agents ruled out: Secondary | ICD-10-CM | POA: Diagnosis not present

## 2019-02-21 ENCOUNTER — Other Ambulatory Visit: Payer: Self-pay | Admitting: Family Medicine

## 2019-02-21 DIAGNOSIS — Z20828 Contact with and (suspected) exposure to other viral communicable diseases: Secondary | ICD-10-CM | POA: Diagnosis not present

## 2019-02-22 DIAGNOSIS — Z03818 Encounter for observation for suspected exposure to other biological agents ruled out: Secondary | ICD-10-CM | POA: Diagnosis not present

## 2019-03-17 ENCOUNTER — Other Ambulatory Visit: Payer: Self-pay

## 2019-03-17 ENCOUNTER — Ambulatory Visit (INDEPENDENT_AMBULATORY_CARE_PROVIDER_SITE_OTHER): Payer: Medicare HMO

## 2019-03-17 DIAGNOSIS — Z23 Encounter for immunization: Secondary | ICD-10-CM | POA: Diagnosis not present

## 2019-03-24 ENCOUNTER — Other Ambulatory Visit: Payer: Self-pay | Admitting: Family Medicine

## 2019-04-07 ENCOUNTER — Telehealth: Payer: Self-pay | Admitting: Family Medicine

## 2019-04-07 ENCOUNTER — Other Ambulatory Visit: Payer: Self-pay

## 2019-04-07 MED ORDER — BLOOD GLUCOSE METER KIT: PACK | 0 refills | Status: AC

## 2019-04-07 NOTE — Telephone Encounter (Signed)
Copied from [place] [id number]. Topic: General - Other >> Apr 07, 2019 10:37 AM Sheran Luz wrote: Patient requesting RX for blood glucose strip sent to pharmacy.   [place], [place] [phone] (Phone) [phone] (Fax)

## 2019-04-07 NOTE — Telephone Encounter (Signed)
Rx sent to pharmacy. Pt notified of update. 

## 2019-04-22 ENCOUNTER — Other Ambulatory Visit: Payer: Self-pay | Admitting: Family Medicine

## 2019-05-13 ENCOUNTER — Ambulatory Visit (INDEPENDENT_AMBULATORY_CARE_PROVIDER_SITE_OTHER): Payer: Medicare HMO | Admitting: Family Medicine

## 2019-05-13 ENCOUNTER — Other Ambulatory Visit: Payer: Self-pay

## 2019-05-13 ENCOUNTER — Encounter: Payer: Self-pay | Admitting: Family Medicine

## 2019-05-13 VITALS — BP 130/80 | HR 107 | Temp 97.4°F

## 2019-05-13 DIAGNOSIS — R519 Headache, unspecified: Secondary | ICD-10-CM

## 2019-05-13 DIAGNOSIS — G4459 Other complicated headache syndrome: Secondary | ICD-10-CM | POA: Diagnosis not present

## 2019-05-13 LAB — BASIC METABOLIC PANEL
BUN: 14 mg/dL (ref 6–23)
CO2: 28 mEq/L (ref 19–32)
Calcium: 9.6 mg/dL (ref 8.4–10.5)
Chloride: 102 mEq/L (ref 96–112)
Creatinine, Ser: 1.07 mg/dL (ref 0.40–1.20)
GFR: 48.47 mL/min — ABNORMAL LOW (ref >60.00)
Glucose, Bld: 178 mg/dL — ABNORMAL HIGH (ref 70–99)
Potassium: 3.8 mEq/L (ref 3.5–5.1)
Sodium: 138 mEq/L (ref 135–145)

## 2019-05-13 NOTE — Progress Notes (Signed)
   Subjective:    Patient ID: Amy [name], female    DOB: December 02, 1929, 85 y.o.   MRN: [id number]  HPI Here with her daughter, Amy [name]) to discuss the events of the past 3 days. 2 days ago at about 5 pm she developed some sparkling lights in the right lower visual field and she became mildly confused. She says she was trying to dial the phone to call a friend and she could not enter the digits into the phone in the proper order. After trying a few times she gave up. There was no headache at that time but early the next morning she developed a throbbing headache in the left forehead and left temple area. At that time the confusion had resolved. The pain lasted all that day until she fell asleep that night. It had stopped by the time she woke up this morning. She took a few Tylenol during the headache, and these actually eased it up a little. No other neurologic deficits were noticed, and by yesterday evening. She wasnauseated during the headache but she did not vomit. Lights did not bother her. She normally does not have headaches, but she did have a lot of migraines in her 59s and 30s.    Review of Systems  Constitutional: Negative.   HENT: Negative.   Eyes: Positive for visual disturbance.  Respiratory: Negative.   Cardiovascular: Negative.   Neurological: Positive for headaches. Negative for dizziness, tremors, seizures, syncope, facial asymmetry, speech difficulty, weakness, light-headedness and numbness.  Psychiatric/Behavioral: Positive for confusion.       Objective:   Physical Exam Constitutional:      General: She is not in acute distress.    Appearance: Normal appearance. She is well-developed.  Cardiovascular:     Rate and Rhythm: Normal rate and regular rhythm.     Pulses: Normal pulses.     Heart sounds: Normal heart sounds.  Pulmonary:     Effort: Pulmonary effort is normal.     Breath sounds: Normal breath sounds.  Neurological:     General: No  focal deficit present.     Mental Status: She is alert and oriented to person, place, and time. Mental status is at baseline.     Cranial Nerves: No cranial nerve deficit.     Sensory: No sensory deficit.     Motor: No weakness.     Coordination: Coordination normal.     Gait: Gait normal.           Assessment & Plan:  She had an episode of unilateral headache with scotomata and trouble concentrating. She seems to be back to baseline today. This was most likely a migraine episode, but we need to rule out a stroke or other intracranial process. We will set up a brain MRI soon. She will let us know if this happens again.  [name], MD

## 2019-05-17 ENCOUNTER — Other Ambulatory Visit: Payer: Self-pay | Admitting: Family Medicine

## 2019-05-18 DIAGNOSIS — H524 Presbyopia: Secondary | ICD-10-CM | POA: Diagnosis not present

## 2019-05-18 DIAGNOSIS — H52223 Regular astigmatism, bilateral: Secondary | ICD-10-CM | POA: Diagnosis not present

## 2019-05-18 DIAGNOSIS — G43B0 Ophthalmoplegic migraine, not intractable: Secondary | ICD-10-CM | POA: Diagnosis not present

## 2019-05-18 DIAGNOSIS — H5203 Hypermetropia, bilateral: Secondary | ICD-10-CM | POA: Diagnosis not present

## 2019-05-18 DIAGNOSIS — H43393 Other vitreous opacities, bilateral: Secondary | ICD-10-CM | POA: Diagnosis not present

## 2019-06-15 ENCOUNTER — Other Ambulatory Visit: Payer: Self-pay | Admitting: Family Medicine

## 2019-06-17 ENCOUNTER — Ambulatory Visit
Admission: RE | Admit: 2019-06-17 | Discharge: 2019-06-17 | Disposition: A | Discharge status: Home / Self Care | Payer: Medicare HMO | Source: Ambulatory Visit | Attending: Family Medicine | Admitting: Family Medicine

## 2019-06-17 ENCOUNTER — Other Ambulatory Visit: Payer: Self-pay

## 2019-06-17 DIAGNOSIS — G9389 Other specified disorders of brain: Secondary | ICD-10-CM | POA: Diagnosis not present

## 2019-06-17 DIAGNOSIS — G4459 Other complicated headache syndrome: Secondary | ICD-10-CM

## 2019-06-17 MED ORDER — GADOBENATE DIMEGLUMINE 529 MG/ML IV SOLN
15.0000 mL | Freq: Once | INTRAVENOUS | Status: AC | PRN
  Administered 2019-06-17: 15 mL via INTRAVENOUS

## 2019-06-23 NOTE — Addendum Note (Signed)
Addended by: [name] A on: 06/23/2019 08:46 AM   Modules accepted: Orders

## 2019-07-15 ENCOUNTER — Other Ambulatory Visit: Payer: Self-pay | Admitting: Family Medicine

## 2019-07-25 ENCOUNTER — Telehealth: Payer: Self-pay | Admitting: Family Medicine

## 2019-07-25 NOTE — Telephone Encounter (Signed)
Per notes in referral the patient will be contacted around 3/5 for scheduling.  Left message for patient to call back.

## 2019-07-25 NOTE — Telephone Encounter (Signed)
Pt stated that she is suppose to have a MRI repeated for her brain. She has not heard anything from anyone on setting that appt up and would like a call back from [facility] or [facility]   Pt can be reached at [phone]

## 2019-08-01 NOTE — Telephone Encounter (Signed)
Left message for patient to call back  

## 2019-08-02 ENCOUNTER — Telehealth (INDEPENDENT_AMBULATORY_CARE_PROVIDER_SITE_OTHER): Payer: Medicare HMO | Admitting: Family Medicine

## 2019-08-02 ENCOUNTER — Other Ambulatory Visit: Payer: Self-pay

## 2019-08-02 DIAGNOSIS — N390 Urinary tract infection, site not specified: Secondary | ICD-10-CM | POA: Diagnosis not present

## 2019-08-02 MED ORDER — CEPHALEXIN 500 MG PO CAPS: 500.0000 mg | ORAL_CAPSULE | Freq: Three times a day (TID) | ORAL | 0 refills | Status: AC

## 2019-08-02 NOTE — Progress Notes (Signed)
Virtual Visit via Telephone Note  I connected with the patient on 08/02/19 at 11:00 AM EST by telephone and verified that I am speaking with the correct person using two identifiers.   I discussed the limitations, risks, security and privacy concerns of performing an evaluation and management service by telephone and the availability of in person appointments. I also discussed with the patient that there may be a patient responsible charge related to this service. The patient expressed understanding and agreed to proceed.  Location patient: home Location provider: work or home office Participants present for the call: patient, provider Patient did not have a visit in the prior 7 days to address this/these issue(s).   History of Present Illness: Here for 2 weeks of low back pain, suprapubic pain, and urgency to urinate. No nausea or fever. Drinking plenty of water.    Observations/Objective: Patient sounds cheerful and well on the phone. I do not appreciate any SOB. Speech and thought processing are grossly intact. Patient reported vitals:  Assessment and Plan: UTI, treat with Keflex.  [name], MD   Follow Up Instructions:     [phone] 5-10 [phone] 11-20 [id number] 21-30 I did not refer this patient for an OV in the next 24 hours for this/these issue(s).  I discussed the assessment and treatment plan with the patient. The patient was provided an opportunity to ask questions and all were answered. The patient agreed with the plan and demonstrated an understanding of the instructions.   The patient was advised to call back or seek an in-person evaluation if the symptoms worsen or if the condition fails to improve as anticipated.  I provided 10 minutes of non-face-to-face time during this encounter.   [name], MD

## 2019-08-04 ENCOUNTER — Other Ambulatory Visit (INDEPENDENT_AMBULATORY_CARE_PROVIDER_SITE_OTHER): Payer: Medicare HMO

## 2019-08-04 ENCOUNTER — Other Ambulatory Visit: Payer: Self-pay

## 2019-08-04 DIAGNOSIS — N390 Urinary tract infection, site not specified: Secondary | ICD-10-CM | POA: Diagnosis not present

## 2019-08-04 DIAGNOSIS — H6123 Impacted cerumen, bilateral: Secondary | ICD-10-CM | POA: Diagnosis not present

## 2019-08-04 LAB — BASIC METABOLIC PANEL
BUN: 23 mg/dL (ref 6–23)
CO2: 27 mEq/L (ref 19–32)
Calcium: 9 mg/dL (ref 8.4–10.5)
Chloride: 103 mEq/L (ref 96–112)
Creatinine, Ser: 1.1 mg/dL (ref 0.40–1.20)
GFR: 46.92 mL/min — ABNORMAL LOW (ref >60.00)
Glucose, Bld: 155 mg/dL — ABNORMAL HIGH (ref 70–99)
Potassium: 3.9 mEq/L (ref 3.5–5.1)
Sodium: 137 mEq/L (ref 135–145)

## 2019-08-12 ENCOUNTER — Other Ambulatory Visit: Payer: Self-pay | Admitting: Family Medicine

## 2019-08-31 DIAGNOSIS — Z20828 Contact with and (suspected) exposure to other viral communicable diseases: Secondary | ICD-10-CM | POA: Diagnosis not present

## 2019-08-31 DIAGNOSIS — Z1159 Encounter for screening for other viral diseases: Secondary | ICD-10-CM | POA: Diagnosis not present

## 2019-09-05 ENCOUNTER — Other Ambulatory Visit: Payer: Self-pay | Admitting: Family Medicine

## 2019-09-07 DIAGNOSIS — Z20828 Contact with and (suspected) exposure to other viral communicable diseases: Secondary | ICD-10-CM | POA: Diagnosis not present

## 2019-09-07 DIAGNOSIS — Z1159 Encounter for screening for other viral diseases: Secondary | ICD-10-CM | POA: Diagnosis not present

## 2019-09-14 DIAGNOSIS — Z20828 Contact with and (suspected) exposure to other viral communicable diseases: Secondary | ICD-10-CM | POA: Diagnosis not present

## 2019-09-14 DIAGNOSIS — Z1159 Encounter for screening for other viral diseases: Secondary | ICD-10-CM | POA: Diagnosis not present

## 2019-09-21 DIAGNOSIS — I10 Essential (primary) hypertension: Secondary | ICD-10-CM | POA: Diagnosis not present

## 2019-09-21 DIAGNOSIS — Z974 Presence of external hearing-aid: Secondary | ICD-10-CM | POA: Diagnosis not present

## 2019-09-21 DIAGNOSIS — Z1159 Encounter for screening for other viral diseases: Secondary | ICD-10-CM | POA: Diagnosis not present

## 2019-09-21 DIAGNOSIS — Z20828 Contact with and (suspected) exposure to other viral communicable diseases: Secondary | ICD-10-CM | POA: Diagnosis not present

## 2019-09-28 DIAGNOSIS — Z1159 Encounter for screening for other viral diseases: Secondary | ICD-10-CM | POA: Diagnosis not present

## 2019-09-28 DIAGNOSIS — Z20828 Contact with and (suspected) exposure to other viral communicable diseases: Secondary | ICD-10-CM | POA: Diagnosis not present

## 2019-10-05 DIAGNOSIS — Z20828 Contact with and (suspected) exposure to other viral communicable diseases: Secondary | ICD-10-CM | POA: Diagnosis not present

## 2019-10-05 DIAGNOSIS — Z1159 Encounter for screening for other viral diseases: Secondary | ICD-10-CM | POA: Diagnosis not present

## 2019-10-07 ENCOUNTER — Other Ambulatory Visit: Payer: Self-pay | Admitting: Family Medicine

## 2019-10-12 DIAGNOSIS — Z1159 Encounter for screening for other viral diseases: Secondary | ICD-10-CM | POA: Diagnosis not present

## 2019-10-12 DIAGNOSIS — Z20828 Contact with and (suspected) exposure to other viral communicable diseases: Secondary | ICD-10-CM | POA: Diagnosis not present

## 2019-10-19 DIAGNOSIS — Z1159 Encounter for screening for other viral diseases: Secondary | ICD-10-CM | POA: Diagnosis not present

## 2019-10-19 DIAGNOSIS — Z20828 Contact with and (suspected) exposure to other viral communicable diseases: Secondary | ICD-10-CM | POA: Diagnosis not present

## 2019-10-26 DIAGNOSIS — Z1159 Encounter for screening for other viral diseases: Secondary | ICD-10-CM | POA: Diagnosis not present

## 2019-10-26 DIAGNOSIS — Z20828 Contact with and (suspected) exposure to other viral communicable diseases: Secondary | ICD-10-CM | POA: Diagnosis not present

## 2019-11-02 DIAGNOSIS — Z1159 Encounter for screening for other viral diseases: Secondary | ICD-10-CM | POA: Diagnosis not present

## 2019-11-02 DIAGNOSIS — Z20828 Contact with and (suspected) exposure to other viral communicable diseases: Secondary | ICD-10-CM | POA: Diagnosis not present

## 2019-11-04 DIAGNOSIS — R296 Repeated falls: Secondary | ICD-10-CM | POA: Diagnosis not present

## 2019-11-04 DIAGNOSIS — M6281 Muscle weakness (generalized): Secondary | ICD-10-CM | POA: Diagnosis not present

## 2019-11-05 ENCOUNTER — Other Ambulatory Visit: Payer: Self-pay | Admitting: Family Medicine

## 2019-11-07 ENCOUNTER — Other Ambulatory Visit: Payer: Self-pay

## 2019-11-08 ENCOUNTER — Ambulatory Visit: Payer: Medicare HMO | Admitting: Family Medicine

## 2019-11-09 ENCOUNTER — Ambulatory Visit (INDEPENDENT_AMBULATORY_CARE_PROVIDER_SITE_OTHER): Payer: Medicare HMO | Admitting: Family Medicine

## 2019-11-09 ENCOUNTER — Encounter: Payer: Self-pay | Admitting: Family Medicine

## 2019-11-09 ENCOUNTER — Other Ambulatory Visit: Payer: Self-pay

## 2019-11-09 VITALS — BP 124/60 | HR 96 | Temp 98.4°F

## 2019-11-09 DIAGNOSIS — R300 Dysuria: Secondary | ICD-10-CM | POA: Diagnosis not present

## 2019-11-09 DIAGNOSIS — Z20828 Contact with and (suspected) exposure to other viral communicable diseases: Secondary | ICD-10-CM | POA: Diagnosis not present

## 2019-11-09 DIAGNOSIS — Z1159 Encounter for screening for other viral diseases: Secondary | ICD-10-CM | POA: Diagnosis not present

## 2019-11-09 DIAGNOSIS — N390 Urinary tract infection, site not specified: Secondary | ICD-10-CM

## 2019-11-09 LAB — POCT URINALYSIS DIPSTICK
Bilirubin, UA: POSITIVE
Blood, UA: NEGATIVE
Glucose, UA: NEGATIVE
Ketones, UA: NEGATIVE
Leukocytes, UA: NEGATIVE
Nitrite, UA: NEGATIVE
Protein, UA: POSITIVE — AB
Spec Grav, UA: 1.025 (ref 1.010–1.025)
Urobilinogen, UA: 0.2 E.U./dL
pH, UA: 5.5 (ref 5.0–8.0)

## 2019-11-09 MED ORDER — CEPHALEXIN 500 MG PO CAPS: 500.0000 mg | ORAL_CAPSULE | Freq: Three times a day (TID) | ORAL | 0 refills | Status: DC

## 2019-11-09 NOTE — Addendum Note (Signed)
Addended by: Marrion Coy on: 11/09/2019 01:43 PM   Modules accepted: Orders

## 2019-11-09 NOTE — Progress Notes (Signed)
   Subjective:    Patient ID: Amy [name], female    DOB: [birth year], 86 y.o.   MRN: [id number]  HPI Here for several days of urgency to urinate with suprapubic pressure. No fever. Drinking plenty of water.    Review of Systems  Constitutional: Negative.   Respiratory: Negative.   Cardiovascular: Negative.   Gastrointestinal: Negative.   Genitourinary: Positive for frequency and urgency.       Objective:   Physical Exam Constitutional:      Appearance: Normal appearance. She is not ill-appearing.  Cardiovascular:     Rate and Rhythm: Normal rate and regular rhythm.     Pulses: Normal pulses.     Heart sounds: Normal heart sounds.  Pulmonary:     Effort: Pulmonary effort is normal.     Breath sounds: Normal breath sounds.  Neurological:     Mental Status: She is alert.           Assessment & Plan:  UTI, treat with Keflex. Culture the sample.  [name], MD

## 2019-11-10 LAB — URINE CULTURE
MICRO NUMBER:: 10598466
SPECIMEN QUALITY:: ADEQUATE

## 2019-11-16 DIAGNOSIS — Z1159 Encounter for screening for other viral diseases: Secondary | ICD-10-CM | POA: Diagnosis not present

## 2019-11-16 DIAGNOSIS — Z20828 Contact with and (suspected) exposure to other viral communicable diseases: Secondary | ICD-10-CM | POA: Diagnosis not present

## 2019-11-16 DIAGNOSIS — M6281 Muscle weakness (generalized): Secondary | ICD-10-CM | POA: Diagnosis not present

## 2019-11-16 DIAGNOSIS — R296 Repeated falls: Secondary | ICD-10-CM | POA: Diagnosis not present

## 2019-11-17 DIAGNOSIS — M6281 Muscle weakness (generalized): Secondary | ICD-10-CM | POA: Diagnosis not present

## 2019-11-17 DIAGNOSIS — R296 Repeated falls: Secondary | ICD-10-CM | POA: Diagnosis not present

## 2019-11-18 DIAGNOSIS — R296 Repeated falls: Secondary | ICD-10-CM | POA: Diagnosis not present

## 2019-11-18 DIAGNOSIS — M6281 Muscle weakness (generalized): Secondary | ICD-10-CM | POA: Diagnosis not present

## 2019-11-22 DIAGNOSIS — R296 Repeated falls: Secondary | ICD-10-CM | POA: Diagnosis not present

## 2019-11-22 DIAGNOSIS — M6281 Muscle weakness (generalized): Secondary | ICD-10-CM | POA: Diagnosis not present

## 2019-11-23 DIAGNOSIS — Z20828 Contact with and (suspected) exposure to other viral communicable diseases: Secondary | ICD-10-CM | POA: Diagnosis not present

## 2019-11-23 DIAGNOSIS — R296 Repeated falls: Secondary | ICD-10-CM | POA: Diagnosis not present

## 2019-11-23 DIAGNOSIS — Z1159 Encounter for screening for other viral diseases: Secondary | ICD-10-CM | POA: Diagnosis not present

## 2019-11-23 DIAGNOSIS — M6281 Muscle weakness (generalized): Secondary | ICD-10-CM | POA: Diagnosis not present

## 2019-11-24 DIAGNOSIS — M6281 Muscle weakness (generalized): Secondary | ICD-10-CM | POA: Diagnosis not present

## 2019-11-24 DIAGNOSIS — R296 Repeated falls: Secondary | ICD-10-CM | POA: Diagnosis not present

## 2019-11-29 DIAGNOSIS — M6281 Muscle weakness (generalized): Secondary | ICD-10-CM | POA: Diagnosis not present

## 2019-11-29 DIAGNOSIS — R296 Repeated falls: Secondary | ICD-10-CM | POA: Diagnosis not present

## 2019-11-30 ENCOUNTER — Other Ambulatory Visit: Payer: Self-pay | Admitting: Family Medicine

## 2019-11-30 DIAGNOSIS — R296 Repeated falls: Secondary | ICD-10-CM | POA: Diagnosis not present

## 2019-11-30 DIAGNOSIS — Z1159 Encounter for screening for other viral diseases: Secondary | ICD-10-CM | POA: Diagnosis not present

## 2019-11-30 DIAGNOSIS — Z20828 Contact with and (suspected) exposure to other viral communicable diseases: Secondary | ICD-10-CM | POA: Diagnosis not present

## 2019-11-30 DIAGNOSIS — M6281 Muscle weakness (generalized): Secondary | ICD-10-CM | POA: Diagnosis not present

## 2019-12-01 DIAGNOSIS — R296 Repeated falls: Secondary | ICD-10-CM | POA: Diagnosis not present

## 2019-12-01 DIAGNOSIS — M6281 Muscle weakness (generalized): Secondary | ICD-10-CM | POA: Diagnosis not present

## 2019-12-07 DIAGNOSIS — Z1159 Encounter for screening for other viral diseases: Secondary | ICD-10-CM | POA: Diagnosis not present

## 2019-12-07 DIAGNOSIS — R296 Repeated falls: Secondary | ICD-10-CM | POA: Diagnosis not present

## 2019-12-07 DIAGNOSIS — Z20828 Contact with and (suspected) exposure to other viral communicable diseases: Secondary | ICD-10-CM | POA: Diagnosis not present

## 2019-12-07 DIAGNOSIS — M6281 Muscle weakness (generalized): Secondary | ICD-10-CM | POA: Diagnosis not present

## 2019-12-09 DIAGNOSIS — M6281 Muscle weakness (generalized): Secondary | ICD-10-CM | POA: Diagnosis not present

## 2019-12-09 DIAGNOSIS — R296 Repeated falls: Secondary | ICD-10-CM | POA: Diagnosis not present

## 2019-12-13 DIAGNOSIS — L814 Other melanin hyperpigmentation: Secondary | ICD-10-CM | POA: Diagnosis not present

## 2019-12-13 DIAGNOSIS — L570 Actinic keratosis: Secondary | ICD-10-CM | POA: Diagnosis not present

## 2019-12-13 DIAGNOSIS — D1801 Hemangioma of skin and subcutaneous tissue: Secondary | ICD-10-CM | POA: Diagnosis not present

## 2019-12-13 DIAGNOSIS — L720 Epidermal cyst: Secondary | ICD-10-CM | POA: Diagnosis not present

## 2019-12-13 DIAGNOSIS — L988 Other specified disorders of the skin and subcutaneous tissue: Secondary | ICD-10-CM | POA: Diagnosis not present

## 2019-12-13 DIAGNOSIS — L821 Other seborrheic keratosis: Secondary | ICD-10-CM | POA: Diagnosis not present

## 2019-12-14 DIAGNOSIS — Z1159 Encounter for screening for other viral diseases: Secondary | ICD-10-CM | POA: Diagnosis not present

## 2019-12-14 DIAGNOSIS — Z20828 Contact with and (suspected) exposure to other viral communicable diseases: Secondary | ICD-10-CM | POA: Diagnosis not present

## 2019-12-21 DIAGNOSIS — Z1159 Encounter for screening for other viral diseases: Secondary | ICD-10-CM | POA: Diagnosis not present

## 2019-12-21 DIAGNOSIS — Z20828 Contact with and (suspected) exposure to other viral communicable diseases: Secondary | ICD-10-CM | POA: Diagnosis not present

## 2019-12-28 ENCOUNTER — Ambulatory Visit (INDEPENDENT_AMBULATORY_CARE_PROVIDER_SITE_OTHER): Payer: Medicare HMO | Admitting: Family Medicine

## 2019-12-28 ENCOUNTER — Encounter: Payer: Self-pay | Admitting: Family Medicine

## 2019-12-28 ENCOUNTER — Ambulatory Visit (INDEPENDENT_AMBULATORY_CARE_PROVIDER_SITE_OTHER)
Admission: RE | Admit: 2019-12-28 | Discharge: 2019-12-28 | Disposition: A | Discharge status: Home / Self Care | Payer: Medicare HMO | Source: Ambulatory Visit | Attending: Family Medicine | Admitting: Family Medicine

## 2019-12-28 ENCOUNTER — Other Ambulatory Visit: Payer: Self-pay

## 2019-12-28 VITALS — BP 124/60 | HR 96 | Temp 97.7°F

## 2019-12-28 DIAGNOSIS — M545 Low back pain, unspecified: Secondary | ICD-10-CM

## 2019-12-28 MED ORDER — TRAMADOL HCL 50 MG PO TABS: 100.0000 mg | ORAL_TABLET | Freq: Four times a day (QID) | ORAL | 2 refills | Status: DC | PRN

## 2019-12-28 NOTE — Progress Notes (Signed)
   Subjective:    Patient ID: MICAELLA GITTO, female    DOB: 05/24/1930, 86 y.o.   MRN: [id number]  HPI Here for low back pain that stated a few weeks ago. The pain can be severe, and it involves the left and right sides of the lower back. No pain in the legs. No numbness or weakness in the legs. It is worse when standing or walking. Tylenol helps a little. No recent trauma.    Review of Systems  Constitutional: Negative.   Respiratory: Negative.   Cardiovascular: Negative.   Gastrointestinal: Negative.   Genitourinary: Negative.   Musculoskeletal: Positive for back pain.       Objective:   Physical Exam Constitutional:      Appearance: Normal appearance.  Cardiovascular:     Rate and Rhythm: Normal rate and regular rhythm.     Pulses: Normal pulses.     Heart sounds: Normal heart sounds.  Pulmonary:     Effort: Pulmonary effort is normal.     Breath sounds: Normal breath sounds.  Musculoskeletal:     Comments: She is tender over the lower spine. ROM is full. Negative SLR   Neurological:     Mental Status: She is alert.           Assessment & Plan:  Lumbar pain, use Tramadol as needed. Set up lumbar spine Xrays.  [name], MD

## 2019-12-30 ENCOUNTER — Other Ambulatory Visit: Payer: Self-pay | Admitting: Family Medicine

## 2020-01-04 DIAGNOSIS — Z20828 Contact with and (suspected) exposure to other viral communicable diseases: Secondary | ICD-10-CM | POA: Diagnosis not present

## 2020-01-04 DIAGNOSIS — Z1159 Encounter for screening for other viral diseases: Secondary | ICD-10-CM | POA: Diagnosis not present

## 2020-01-09 ENCOUNTER — Other Ambulatory Visit: Payer: Self-pay | Admitting: Family Medicine

## 2020-01-11 DIAGNOSIS — Z1159 Encounter for screening for other viral diseases: Secondary | ICD-10-CM | POA: Diagnosis not present

## 2020-01-11 DIAGNOSIS — Z20828 Contact with and (suspected) exposure to other viral communicable diseases: Secondary | ICD-10-CM | POA: Diagnosis not present

## 2020-01-18 DIAGNOSIS — Z1159 Encounter for screening for other viral diseases: Secondary | ICD-10-CM | POA: Diagnosis not present

## 2020-01-18 DIAGNOSIS — Z20828 Contact with and (suspected) exposure to other viral communicable diseases: Secondary | ICD-10-CM | POA: Diagnosis not present

## 2020-01-23 DIAGNOSIS — R2681 Unsteadiness on feet: Secondary | ICD-10-CM | POA: Diagnosis not present

## 2020-01-23 DIAGNOSIS — M6281 Muscle weakness (generalized): Secondary | ICD-10-CM | POA: Diagnosis not present

## 2020-01-25 ENCOUNTER — Other Ambulatory Visit: Payer: Self-pay | Admitting: Family Medicine

## 2020-01-25 DIAGNOSIS — Z1159 Encounter for screening for other viral diseases: Secondary | ICD-10-CM | POA: Diagnosis not present

## 2020-01-25 DIAGNOSIS — Z20828 Contact with and (suspected) exposure to other viral communicable diseases: Secondary | ICD-10-CM | POA: Diagnosis not present

## 2020-02-01 ENCOUNTER — Telehealth: Payer: Self-pay | Admitting: Family Medicine

## 2020-02-01 DIAGNOSIS — Z20828 Contact with and (suspected) exposure to other viral communicable diseases: Secondary | ICD-10-CM | POA: Diagnosis not present

## 2020-02-01 DIAGNOSIS — Z1159 Encounter for screening for other viral diseases: Secondary | ICD-10-CM | POA: Diagnosis not present

## 2020-02-01 NOTE — Telephone Encounter (Signed)
Pt said she is having stomach issues. And wants a call back  Please call her at [phone]

## 2020-02-01 NOTE — Telephone Encounter (Signed)
I called and spoke with pt. Since last Thursday, she felt fatigued & had a fever of 101 on Friday & Saturday. She is having diarrhea and at times it is uncontrollable. She has no fever now, and no appetite. She lives in a facility so they had her tested for covid twice and both tests were negative.  Please advise.

## 2020-02-02 ENCOUNTER — Other Ambulatory Visit: Payer: Self-pay

## 2020-02-02 NOTE — Telephone Encounter (Signed)
Spoke with pt, appt scheduled for tomorrow at 1:45.

## 2020-02-02 NOTE — Telephone Encounter (Signed)
She needs an OV 

## 2020-02-03 ENCOUNTER — Encounter: Payer: Self-pay | Admitting: Family Medicine

## 2020-02-03 ENCOUNTER — Ambulatory Visit (INDEPENDENT_AMBULATORY_CARE_PROVIDER_SITE_OTHER): Payer: Medicare HMO | Admitting: Family Medicine

## 2020-02-03 VITALS — BP 110/70 | HR 70 | Temp 97.7°F

## 2020-02-03 DIAGNOSIS — B349 Viral infection, unspecified: Secondary | ICD-10-CM

## 2020-02-03 NOTE — Progress Notes (Signed)
   Subjective:    Patient ID: Amy [name], female    DOB: 15-Sep-1929, 86 y.o.   MRN: [id number]  HPI Here for 8 days of an illness that began with watery diarrhea and fever to 101 degrees. She has had some body aches as well, but no abdominal pains. No headache or ST or cough. She has been nauseated but has not vomited. The diarrhea lasted 3 days and then went away. Now she feels like she is back to her baseline except she remains very fatigued. She is drinking fluids but has not eaten much for the last week. She took Imodium for a few days. She was tested twice at her living facility for the Covid virus, both negative.    Review of Systems  Constitutional: Positive for fatigue. Negative for chills, diaphoresis and fever.  HENT: Negative.   Eyes: Negative.   Respiratory: Negative.   Cardiovascular: Negative.   Gastrointestinal: Negative.   Genitourinary: Negative.        Objective:   Physical Exam Constitutional:      Appearance: Normal appearance. She is not ill-appearing.  HENT:     Right Ear: Tympanic membrane, ear canal and external ear normal.     Left Ear: Tympanic membrane, ear canal and external ear normal.     Nose: Nose normal.     Mouth/Throat:     Pharynx: Oropharynx is clear.  Eyes:     Conjunctiva/sclera: Conjunctivae normal.  Cardiovascular:     Rate and Rhythm: Normal rate and regular rhythm.     Pulses: Normal pulses.     Heart sounds: Normal heart sounds.  Pulmonary:     Effort: Pulmonary effort is normal.     Breath sounds: Normal breath sounds.  Abdominal:     General: Abdomen is flat. Bowel sounds are normal. There is no distension.     Palpations: Abdomen is soft. There is no mass.     Tenderness: There is no abdominal tenderness. There is no guarding or rebound.     Hernia: No hernia is present.  Lymphadenopathy:     Cervical: No cervical adenopathy.  Neurological:     Mental Status: She is alert.           Assessment & Plan:  Viral  illness. This seems to have passed. She will advance her diet as tolerated so she can build her strength back up. Recheck prn.  [name], MD

## 2020-02-06 ENCOUNTER — Other Ambulatory Visit: Payer: Self-pay

## 2020-02-07 ENCOUNTER — Ambulatory Visit (INDEPENDENT_AMBULATORY_CARE_PROVIDER_SITE_OTHER): Payer: Medicare HMO | Admitting: Family Medicine

## 2020-02-07 VITALS — BP 140/80 | HR 71 | Temp 99.2°F

## 2020-02-07 DIAGNOSIS — M6281 Muscle weakness (generalized): Secondary | ICD-10-CM | POA: Diagnosis not present

## 2020-02-07 DIAGNOSIS — R2681 Unsteadiness on feet: Secondary | ICD-10-CM | POA: Diagnosis not present

## 2020-02-07 DIAGNOSIS — Z23 Encounter for immunization: Secondary | ICD-10-CM

## 2020-02-07 DIAGNOSIS — H6123 Impacted cerumen, bilateral: Secondary | ICD-10-CM

## 2020-02-08 ENCOUNTER — Encounter: Payer: Self-pay | Admitting: Family Medicine

## 2020-02-08 DIAGNOSIS — Z20828 Contact with and (suspected) exposure to other viral communicable diseases: Secondary | ICD-10-CM | POA: Diagnosis not present

## 2020-02-08 DIAGNOSIS — Z1159 Encounter for screening for other viral diseases: Secondary | ICD-10-CM | POA: Diagnosis not present

## 2020-02-08 NOTE — Progress Notes (Signed)
   Subjective:    Patient ID: Amy [name], female    DOB: 1929-11-01, 86 y.o.   MRN: [id number]  HPI Here to remove ear cerumen. She was here last week and was noted to have bilateral cerumen impactions. She complains of decreased hearing.    Review of Systems  Constitutional: Negative.   HENT: Positive for hearing loss. Negative for congestion, ear discharge and ear pain.   Respiratory: Negative.   Cardiovascular: Negative.        Objective:   Physical Exam Constitutional:      Appearance: Normal appearance.  HENT:     Ears:     Comments: Both ear canals are full of cerumen  Cardiovascular:     Rate and Rhythm: Normal rate and regular rhythm.     Pulses: Normal pulses.     Heart sounds: Normal heart sounds.  Pulmonary:     Effort: Pulmonary effort is normal.     Breath sounds: Normal breath sounds.  Neurological:     Mental Status: She is alert.           Assessment & Plan:  Cerumen impactions, both were irrigated clear with water. [name], MD

## 2020-02-10 DIAGNOSIS — M6281 Muscle weakness (generalized): Secondary | ICD-10-CM | POA: Diagnosis not present

## 2020-02-10 DIAGNOSIS — R2681 Unsteadiness on feet: Secondary | ICD-10-CM | POA: Diagnosis not present

## 2020-02-11 DIAGNOSIS — R2681 Unsteadiness on feet: Secondary | ICD-10-CM | POA: Diagnosis not present

## 2020-02-11 DIAGNOSIS — M6281 Muscle weakness (generalized): Secondary | ICD-10-CM | POA: Diagnosis not present

## 2020-02-13 DIAGNOSIS — R2681 Unsteadiness on feet: Secondary | ICD-10-CM | POA: Diagnosis not present

## 2020-02-13 DIAGNOSIS — M6281 Muscle weakness (generalized): Secondary | ICD-10-CM | POA: Diagnosis not present

## 2020-02-14 DIAGNOSIS — Z20828 Contact with and (suspected) exposure to other viral communicable diseases: Secondary | ICD-10-CM | POA: Diagnosis not present

## 2020-02-14 DIAGNOSIS — Z1159 Encounter for screening for other viral diseases: Secondary | ICD-10-CM | POA: Diagnosis not present

## 2020-02-16 DIAGNOSIS — R2681 Unsteadiness on feet: Secondary | ICD-10-CM | POA: Diagnosis not present

## 2020-02-16 DIAGNOSIS — M6281 Muscle weakness (generalized): Secondary | ICD-10-CM | POA: Diagnosis not present

## 2020-02-17 DIAGNOSIS — Z20828 Contact with and (suspected) exposure to other viral communicable diseases: Secondary | ICD-10-CM | POA: Diagnosis not present

## 2020-02-17 DIAGNOSIS — Z1159 Encounter for screening for other viral diseases: Secondary | ICD-10-CM | POA: Diagnosis not present

## 2020-02-22 DIAGNOSIS — M6281 Muscle weakness (generalized): Secondary | ICD-10-CM | POA: Diagnosis not present

## 2020-02-22 DIAGNOSIS — R2681 Unsteadiness on feet: Secondary | ICD-10-CM | POA: Diagnosis not present

## 2020-02-23 DIAGNOSIS — M6281 Muscle weakness (generalized): Secondary | ICD-10-CM | POA: Diagnosis not present

## 2020-02-23 DIAGNOSIS — R2681 Unsteadiness on feet: Secondary | ICD-10-CM | POA: Diagnosis not present

## 2020-02-24 DIAGNOSIS — R2681 Unsteadiness on feet: Secondary | ICD-10-CM | POA: Diagnosis not present

## 2020-02-24 DIAGNOSIS — Z1159 Encounter for screening for other viral diseases: Secondary | ICD-10-CM | POA: Diagnosis not present

## 2020-02-24 DIAGNOSIS — M6281 Muscle weakness (generalized): Secondary | ICD-10-CM | POA: Diagnosis not present

## 2020-02-24 DIAGNOSIS — Z20828 Contact with and (suspected) exposure to other viral communicable diseases: Secondary | ICD-10-CM | POA: Diagnosis not present

## 2020-02-27 ENCOUNTER — Other Ambulatory Visit: Payer: Self-pay | Admitting: Family Medicine

## 2020-02-27 NOTE — Telephone Encounter (Signed)
Patient's home number called, no answer, no voicemail. Daughter Gordy Savers called, left VM to return the call. Patient will need an appointment for a physical.

## 2020-02-29 DIAGNOSIS — M6281 Muscle weakness (generalized): Secondary | ICD-10-CM | POA: Diagnosis not present

## 2020-02-29 DIAGNOSIS — R2681 Unsteadiness on feet: Secondary | ICD-10-CM | POA: Diagnosis not present

## 2020-03-01 DIAGNOSIS — R2681 Unsteadiness on feet: Secondary | ICD-10-CM | POA: Diagnosis not present

## 2020-03-01 DIAGNOSIS — M6281 Muscle weakness (generalized): Secondary | ICD-10-CM | POA: Diagnosis not present

## 2020-03-02 DIAGNOSIS — Z20828 Contact with and (suspected) exposure to other viral communicable diseases: Secondary | ICD-10-CM | POA: Diagnosis not present

## 2020-03-02 DIAGNOSIS — Z1159 Encounter for screening for other viral diseases: Secondary | ICD-10-CM | POA: Diagnosis not present

## 2020-03-05 DIAGNOSIS — R2681 Unsteadiness on feet: Secondary | ICD-10-CM | POA: Diagnosis not present

## 2020-03-05 DIAGNOSIS — M6281 Muscle weakness (generalized): Secondary | ICD-10-CM | POA: Diagnosis not present

## 2020-03-06 DIAGNOSIS — Z20828 Contact with and (suspected) exposure to other viral communicable diseases: Secondary | ICD-10-CM | POA: Diagnosis not present

## 2020-03-06 DIAGNOSIS — Z1159 Encounter for screening for other viral diseases: Secondary | ICD-10-CM | POA: Diagnosis not present

## 2020-03-09 DIAGNOSIS — M6281 Muscle weakness (generalized): Secondary | ICD-10-CM | POA: Diagnosis not present

## 2020-03-09 DIAGNOSIS — Z20828 Contact with and (suspected) exposure to other viral communicable diseases: Secondary | ICD-10-CM | POA: Diagnosis not present

## 2020-03-09 DIAGNOSIS — R2681 Unsteadiness on feet: Secondary | ICD-10-CM | POA: Diagnosis not present

## 2020-03-09 DIAGNOSIS — Z1159 Encounter for screening for other viral diseases: Secondary | ICD-10-CM | POA: Diagnosis not present

## 2020-03-12 DIAGNOSIS — R2681 Unsteadiness on feet: Secondary | ICD-10-CM | POA: Diagnosis not present

## 2020-03-12 DIAGNOSIS — M6281 Muscle weakness (generalized): Secondary | ICD-10-CM | POA: Diagnosis not present

## 2020-03-14 DIAGNOSIS — M6281 Muscle weakness (generalized): Secondary | ICD-10-CM | POA: Diagnosis not present

## 2020-03-14 DIAGNOSIS — R2681 Unsteadiness on feet: Secondary | ICD-10-CM | POA: Diagnosis not present

## 2020-03-16 DIAGNOSIS — Z1159 Encounter for screening for other viral diseases: Secondary | ICD-10-CM | POA: Diagnosis not present

## 2020-03-16 DIAGNOSIS — Z20828 Contact with and (suspected) exposure to other viral communicable diseases: Secondary | ICD-10-CM | POA: Diagnosis not present

## 2020-03-19 ENCOUNTER — Other Ambulatory Visit: Payer: Self-pay | Admitting: Family Medicine

## 2020-03-20 DIAGNOSIS — Z20828 Contact with and (suspected) exposure to other viral communicable diseases: Secondary | ICD-10-CM | POA: Diagnosis not present

## 2020-03-20 DIAGNOSIS — Z1159 Encounter for screening for other viral diseases: Secondary | ICD-10-CM | POA: Diagnosis not present

## 2020-03-23 DIAGNOSIS — Z20828 Contact with and (suspected) exposure to other viral communicable diseases: Secondary | ICD-10-CM | POA: Diagnosis not present

## 2020-03-23 DIAGNOSIS — Z1159 Encounter for screening for other viral diseases: Secondary | ICD-10-CM | POA: Diagnosis not present

## 2020-03-27 DIAGNOSIS — Z20828 Contact with and (suspected) exposure to other viral communicable diseases: Secondary | ICD-10-CM | POA: Diagnosis not present

## 2020-03-27 DIAGNOSIS — Z1159 Encounter for screening for other viral diseases: Secondary | ICD-10-CM | POA: Diagnosis not present

## 2020-03-30 DIAGNOSIS — Z20828 Contact with and (suspected) exposure to other viral communicable diseases: Secondary | ICD-10-CM | POA: Diagnosis not present

## 2020-03-30 DIAGNOSIS — Z1159 Encounter for screening for other viral diseases: Secondary | ICD-10-CM | POA: Diagnosis not present

## 2020-04-03 DIAGNOSIS — Z1159 Encounter for screening for other viral diseases: Secondary | ICD-10-CM | POA: Diagnosis not present

## 2020-04-03 DIAGNOSIS — Z20828 Contact with and (suspected) exposure to other viral communicable diseases: Secondary | ICD-10-CM | POA: Diagnosis not present

## 2020-04-06 DIAGNOSIS — Z1159 Encounter for screening for other viral diseases: Secondary | ICD-10-CM | POA: Diagnosis not present

## 2020-04-06 DIAGNOSIS — Z20828 Contact with and (suspected) exposure to other viral communicable diseases: Secondary | ICD-10-CM | POA: Diagnosis not present

## 2020-04-09 ENCOUNTER — Ambulatory Visit (INDEPENDENT_AMBULATORY_CARE_PROVIDER_SITE_OTHER): Payer: Medicare HMO | Admitting: Family Medicine

## 2020-04-09 ENCOUNTER — Encounter: Payer: Self-pay | Admitting: Family Medicine

## 2020-04-09 ENCOUNTER — Other Ambulatory Visit: Payer: Self-pay

## 2020-04-09 VITALS — BP 124/72 | HR 73 | Temp 97.8°F | Ht 63.0 in

## 2020-04-09 DIAGNOSIS — Z0000 Encounter for general adult medical examination without abnormal findings: Secondary | ICD-10-CM

## 2020-04-09 DIAGNOSIS — E119 Type 2 diabetes mellitus without complications: Secondary | ICD-10-CM

## 2020-04-09 DIAGNOSIS — E039 Hypothyroidism, unspecified: Secondary | ICD-10-CM | POA: Diagnosis not present

## 2020-04-09 DIAGNOSIS — I6330 Cerebral infarction due to thrombosis of unspecified cerebral artery: Secondary | ICD-10-CM | POA: Diagnosis not present

## 2020-04-09 DIAGNOSIS — R3989 Other symptoms and signs involving the genitourinary system: Secondary | ICD-10-CM | POA: Diagnosis not present

## 2020-04-09 DIAGNOSIS — I10 Essential (primary) hypertension: Secondary | ICD-10-CM | POA: Diagnosis not present

## 2020-04-09 DIAGNOSIS — F32A Depression, unspecified: Secondary | ICD-10-CM | POA: Diagnosis not present

## 2020-04-09 DIAGNOSIS — R69 Illness, unspecified: Secondary | ICD-10-CM | POA: Diagnosis not present

## 2020-04-09 DIAGNOSIS — R413 Other amnesia: Secondary | ICD-10-CM | POA: Diagnosis not present

## 2020-04-09 LAB — POCT URINALYSIS DIPSTICK
Bilirubin, UA: NEGATIVE
Blood, UA: NEGATIVE
Glucose, UA: NEGATIVE
Ketones, UA: NEGATIVE
Leukocytes, UA: NEGATIVE
Nitrite, UA: NEGATIVE
Protein, UA: POSITIVE — AB
Spec Grav, UA: 1.02 (ref 1.010–1.025)
Urobilinogen, UA: 0.2 E.U./dL
pH, UA: 6 (ref 5.0–8.0)

## 2020-04-09 MED ORDER — ATORVASTATIN CALCIUM 40 MG PO TABS: 40.0000 mg | ORAL_TABLET | Freq: Every day | ORAL | 11 refills | Status: DC

## 2020-04-09 NOTE — Addendum Note (Signed)
Addended by: Marrion Coy on: 04/09/2020 04:01 PM   Modules accepted: Orders

## 2020-04-09 NOTE — Progress Notes (Signed)
   Subjective:    Patient ID: Amy [name], female    DOB: 1930/01/22, 86 y.o.   MRN: [id number]  HPI Here for a well exam. She is doing well. Her arthritis acts up at times and she takes Tylenol for this. Her memory seems to have stabilized since taking Donepezil.    Review of Systems  Constitutional: Negative.   HENT: Negative.   Eyes: Negative.   Respiratory: Negative.   Cardiovascular: Negative.   Gastrointestinal: Negative.   Genitourinary: Negative for decreased urine volume, difficulty urinating, dyspareunia, dysuria, enuresis, flank pain, frequency, hematuria, pelvic pain and urgency.  Musculoskeletal: Positive for arthralgias.  Skin: Negative.   Neurological: Negative.   Psychiatric/Behavioral: Negative.        Objective:   Physical Exam Constitutional:      General: She is not in acute distress.    Appearance: She is well-developed.  HENT:     Head: Normocephalic and atraumatic.     Right Ear: External ear normal.     Left Ear: External ear normal.     Nose: Nose normal.     Mouth/Throat:     Pharynx: No oropharyngeal exudate.  Eyes:     General: No scleral icterus.    Conjunctiva/sclera: Conjunctivae normal.     Pupils: Pupils are equal, round, and reactive to light.  Neck:     Thyroid: No thyromegaly.     Vascular: No JVD.  Cardiovascular:     Rate and Rhythm: Normal rate and regular rhythm.     Heart sounds: Normal heart sounds. No murmur heard.  No friction rub. No gallop.   Pulmonary:     Effort: Pulmonary effort is normal. No respiratory distress.     Breath sounds: Normal breath sounds. No wheezing or rales.  Chest:     Chest wall: No tenderness.  Abdominal:     General: Bowel sounds are normal. There is no distension.     Palpations: Abdomen is soft. There is no mass.     Tenderness: There is no abdominal tenderness. There is no guarding or rebound.  Musculoskeletal:        General: No tenderness. Normal range of motion.     Cervical back:  Normal range of motion and neck supple.  Lymphadenopathy:     Cervical: No cervical adenopathy.  Skin:    General: Skin is warm and dry.     Findings: No erythema or rash.  Neurological:     Mental Status: She is alert and oriented to person, place, and time.     Cranial Nerves: No cranial nerve deficit.     Motor: No abnormal muscle tone.     Coordination: Coordination normal.     Deep Tendon Reflexes: Reflexes are normal and symmetric. Reflexes normal.  Psychiatric:        Behavior: Behavior normal.        Thought Content: Thought content normal.        Judgment: Judgment normal.           Assessment & Plan:  Well exam. We discussed diet and exercise. Get fasting labs.  [name], MD

## 2020-04-09 NOTE — Addendum Note (Signed)
Addended by: Marrion Coy on: 04/09/2020 12:04 PM   Modules accepted: Orders

## 2020-04-10 DIAGNOSIS — Z1159 Encounter for screening for other viral diseases: Secondary | ICD-10-CM | POA: Diagnosis not present

## 2020-04-10 DIAGNOSIS — Z20828 Contact with and (suspected) exposure to other viral communicable diseases: Secondary | ICD-10-CM | POA: Diagnosis not present

## 2020-04-10 LAB — BASIC METABOLIC PANEL WITH GFR
BUN/Creatinine Ratio: 19 (calc) (ref 6–22)
BUN: 17 mg/dL (ref 7–25)
CO2: 27 mmol/L (ref 20–32)
Calcium: 9.5 mg/dL (ref 8.6–10.4)
Chloride: 103 mmol/L (ref 98–110)
Creat: 0.89 mg/dL — ABNORMAL HIGH (ref 0.60–0.88)
GFR, Est African American: 67 mL/min/{1.73_m2} (ref >=60)
GFR, Est Non African American: 58 mL/min/{1.73_m2} — ABNORMAL LOW (ref >=60)
Glucose, Bld: 125 mg/dL — ABNORMAL HIGH (ref 65–99)
Potassium: 4 mmol/L (ref 3.5–5.3)
Sodium: 139 mmol/L (ref 135–146)

## 2020-04-10 LAB — HEPATIC FUNCTION PANEL
AG Ratio: 1.4 (calc) (ref 1.0–2.5)
ALT: 13 U/L (ref 6–29)
AST: 14 U/L (ref 10–35)
Albumin: 4.3 g/dL (ref 3.6–5.1)
Alkaline phosphatase (APISO): 91 U/L (ref 37–153)
Bilirubin, Direct: 0.1 mg/dL (ref 0.0–0.2)
Globulin: 3.1 g/dL (calc) (ref 1.9–3.7)
Indirect Bilirubin: 0.6 mg/dL (calc) (ref 0.2–1.2)
Total Bilirubin: 0.7 mg/dL (ref 0.2–1.2)
Total Protein: 7.4 g/dL (ref 6.1–8.1)

## 2020-04-10 LAB — CBC WITH DIFFERENTIAL/PLATELET
Absolute Monocytes: 591 cells/uL (ref 200–950)
Basophils Absolute: 41 cells/uL (ref 0–200)
Basophils Relative: 0.5 %
Eosinophils Absolute: 113 cells/uL (ref 15–500)
Eosinophils Relative: 1.4 %
HCT: 35.2 % (ref 35.0–45.0)
Hemoglobin: 11.3 g/dL — ABNORMAL LOW (ref 11.7–15.5)
Lymphs Abs: 2325 cells/uL (ref 850–3900)
MCH: 28.6 pg (ref 27.0–33.0)
MCHC: 32.1 g/dL (ref 32.0–36.0)
MCV: 89.1 fL (ref 80.0–100.0)
MPV: 12.5 fL (ref 7.5–12.5)
Monocytes Relative: 7.3 %
Neutro Abs: 5030 cells/uL (ref 1500–7800)
Neutrophils Relative %: 62.1 %
Platelets: 240 10*3/uL (ref 140–400)
RBC: 3.95 10*6/uL (ref 3.80–5.10)
RDW: 13.4 % (ref 11.0–15.0)
Total Lymphocyte: 28.7 %
WBC: 8.1 10*3/uL (ref 3.8–10.8)

## 2020-04-10 LAB — HEMOGLOBIN A1C
Hgb A1c MFr Bld: 6.6 % of total Hgb — ABNORMAL HIGH (ref <5.7)
Mean Plasma Glucose: 143 (calc)
eAG (mmol/L): 7.9 (calc)

## 2020-04-10 LAB — LIPID PANEL
Cholesterol: 135 mg/dL (ref <200)
HDL: 58 mg/dL (ref >=50)
LDL Cholesterol (Calc): 53 mg/dL (calc)
Non-HDL Cholesterol (Calc): 77 mg/dL (calc) (ref <130)
Total CHOL/HDL Ratio: 2.3 (calc) (ref <5.0)
Triglycerides: 159 mg/dL — ABNORMAL HIGH (ref <150)

## 2020-04-10 LAB — T3, FREE: T3, Free: 3.3 pg/mL (ref 2.3–4.2)

## 2020-04-10 LAB — T4, FREE: Free T4: 1.5 ng/dL (ref 0.8–1.8)

## 2020-04-10 LAB — TSH: TSH: 0.05 mIU/L — ABNORMAL LOW (ref 0.40–4.50)

## 2020-04-13 DIAGNOSIS — Z20828 Contact with and (suspected) exposure to other viral communicable diseases: Secondary | ICD-10-CM | POA: Diagnosis not present

## 2020-04-13 DIAGNOSIS — Z1159 Encounter for screening for other viral diseases: Secondary | ICD-10-CM | POA: Diagnosis not present

## 2020-04-17 DIAGNOSIS — Z1159 Encounter for screening for other viral diseases: Secondary | ICD-10-CM | POA: Diagnosis not present

## 2020-04-17 DIAGNOSIS — Z20828 Contact with and (suspected) exposure to other viral communicable diseases: Secondary | ICD-10-CM | POA: Diagnosis not present

## 2020-04-18 ENCOUNTER — Other Ambulatory Visit: Payer: Self-pay | Admitting: Family Medicine

## 2020-04-23 ENCOUNTER — Ambulatory Visit (HOSPITAL_COMMUNITY)
Admission: RE | Admit: 2020-04-23 | Discharge: 2020-04-23 | Disposition: A | Discharge status: Home / Self Care | Payer: Medicare HMO | Source: Ambulatory Visit | Attending: Cardiology | Admitting: Cardiology

## 2020-04-23 ENCOUNTER — Other Ambulatory Visit: Payer: Self-pay

## 2020-04-23 DIAGNOSIS — I6330 Cerebral infarction due to thrombosis of unspecified cerebral artery: Secondary | ICD-10-CM

## 2020-04-24 DIAGNOSIS — Z1159 Encounter for screening for other viral diseases: Secondary | ICD-10-CM | POA: Diagnosis not present

## 2020-04-24 DIAGNOSIS — Z20828 Contact with and (suspected) exposure to other viral communicable diseases: Secondary | ICD-10-CM | POA: Diagnosis not present

## 2020-04-27 DIAGNOSIS — Z20828 Contact with and (suspected) exposure to other viral communicable diseases: Secondary | ICD-10-CM | POA: Diagnosis not present

## 2020-04-27 DIAGNOSIS — Z1159 Encounter for screening for other viral diseases: Secondary | ICD-10-CM | POA: Diagnosis not present

## 2020-04-30 ENCOUNTER — Telehealth: Payer: Self-pay

## 2020-04-30 NOTE — Telephone Encounter (Signed)
Patient aware of results.

## 2020-04-30 NOTE — Telephone Encounter (Signed)
-----   Message from Laurey Morale, MD sent at 04/26/2020  7:58 AM EST ----- Shows mild plaque buildup in both carotid arteries, but no more than what is normal for her age. This would not cause her to have any symptoms

## 2020-05-01 DIAGNOSIS — Z20828 Contact with and (suspected) exposure to other viral communicable diseases: Secondary | ICD-10-CM | POA: Diagnosis not present

## 2020-05-01 DIAGNOSIS — Z1159 Encounter for screening for other viral diseases: Secondary | ICD-10-CM | POA: Diagnosis not present

## 2020-05-04 DIAGNOSIS — Z1159 Encounter for screening for other viral diseases: Secondary | ICD-10-CM | POA: Diagnosis not present

## 2020-05-04 DIAGNOSIS — Z20828 Contact with and (suspected) exposure to other viral communicable diseases: Secondary | ICD-10-CM | POA: Diagnosis not present

## 2020-05-08 DIAGNOSIS — Z1159 Encounter for screening for other viral diseases: Secondary | ICD-10-CM | POA: Diagnosis not present

## 2020-05-08 DIAGNOSIS — Z20828 Contact with and (suspected) exposure to other viral communicable diseases: Secondary | ICD-10-CM | POA: Diagnosis not present

## 2020-05-11 DIAGNOSIS — Z1159 Encounter for screening for other viral diseases: Secondary | ICD-10-CM | POA: Diagnosis not present

## 2020-05-11 DIAGNOSIS — Z20828 Contact with and (suspected) exposure to other viral communicable diseases: Secondary | ICD-10-CM | POA: Diagnosis not present

## 2020-05-15 DIAGNOSIS — Z1159 Encounter for screening for other viral diseases: Secondary | ICD-10-CM | POA: Diagnosis not present

## 2020-05-15 DIAGNOSIS — Z20828 Contact with and (suspected) exposure to other viral communicable diseases: Secondary | ICD-10-CM | POA: Diagnosis not present

## 2020-05-22 DIAGNOSIS — Z1159 Encounter for screening for other viral diseases: Secondary | ICD-10-CM | POA: Diagnosis not present

## 2020-05-22 DIAGNOSIS — Z20828 Contact with and (suspected) exposure to other viral communicable diseases: Secondary | ICD-10-CM | POA: Diagnosis not present

## 2020-05-25 DIAGNOSIS — Z20828 Contact with and (suspected) exposure to other viral communicable diseases: Secondary | ICD-10-CM | POA: Diagnosis not present

## 2020-05-25 DIAGNOSIS — Z1159 Encounter for screening for other viral diseases: Secondary | ICD-10-CM | POA: Diagnosis not present

## 2020-06-01 DIAGNOSIS — Z1159 Encounter for screening for other viral diseases: Secondary | ICD-10-CM | POA: Diagnosis not present

## 2020-06-01 DIAGNOSIS — Z20828 Contact with and (suspected) exposure to other viral communicable diseases: Secondary | ICD-10-CM | POA: Diagnosis not present

## 2020-06-07 DIAGNOSIS — Z20828 Contact with and (suspected) exposure to other viral communicable diseases: Secondary | ICD-10-CM | POA: Diagnosis not present

## 2020-06-12 DIAGNOSIS — Z20828 Contact with and (suspected) exposure to other viral communicable diseases: Secondary | ICD-10-CM | POA: Diagnosis not present

## 2020-06-14 DIAGNOSIS — Z20828 Contact with and (suspected) exposure to other viral communicable diseases: Secondary | ICD-10-CM | POA: Diagnosis not present

## 2020-06-21 DIAGNOSIS — Z20828 Contact with and (suspected) exposure to other viral communicable diseases: Secondary | ICD-10-CM | POA: Diagnosis not present

## 2020-06-25 DIAGNOSIS — Z20828 Contact with and (suspected) exposure to other viral communicable diseases: Secondary | ICD-10-CM | POA: Diagnosis not present

## 2020-06-28 DIAGNOSIS — Z20828 Contact with and (suspected) exposure to other viral communicable diseases: Secondary | ICD-10-CM | POA: Diagnosis not present

## 2020-07-02 DIAGNOSIS — Z20828 Contact with and (suspected) exposure to other viral communicable diseases: Secondary | ICD-10-CM | POA: Diagnosis not present

## 2020-07-05 DIAGNOSIS — Z20828 Contact with and (suspected) exposure to other viral communicable diseases: Secondary | ICD-10-CM | POA: Diagnosis not present

## 2020-07-09 DIAGNOSIS — Z20828 Contact with and (suspected) exposure to other viral communicable diseases: Secondary | ICD-10-CM | POA: Diagnosis not present

## 2020-07-11 ENCOUNTER — Telehealth: Payer: Self-pay | Admitting: Family Medicine

## 2020-07-11 NOTE — Telephone Encounter (Signed)
Left message for patient to call back and schedule Medicare Annual Wellness Visit (AWV) either virtually or in office. No detailed message left   Last AWV no information  please schedule at anytime with [facility] Nurse Health Advisor 1 or 2   This should be a 45 minute visit. 

## 2020-07-12 DIAGNOSIS — Z20828 Contact with and (suspected) exposure to other viral communicable diseases: Secondary | ICD-10-CM | POA: Diagnosis not present

## 2020-07-16 DIAGNOSIS — Z20828 Contact with and (suspected) exposure to other viral communicable diseases: Secondary | ICD-10-CM | POA: Diagnosis not present

## 2020-07-19 DIAGNOSIS — Z20828 Contact with and (suspected) exposure to other viral communicable diseases: Secondary | ICD-10-CM | POA: Diagnosis not present

## 2020-07-23 DIAGNOSIS — Z20828 Contact with and (suspected) exposure to other viral communicable diseases: Secondary | ICD-10-CM | POA: Diagnosis not present

## 2020-07-26 DIAGNOSIS — Z20828 Contact with and (suspected) exposure to other viral communicable diseases: Secondary | ICD-10-CM | POA: Diagnosis not present

## 2020-07-26 NOTE — Progress Notes (Unsigned)
Subjective:   Amy [name] is a 87 y.o. female who presents for an Initial Medicare Annual Wellness Visit.  Review of Systems    N/A       Objective:    There were no vitals filed for this visit. There is no height or weight on file to calculate BMI.  Advanced Directives 04/28/2018 04/25/2016 04/22/2016 04/22/2016 08/30/2015  Does Patient Have a Medical Advance Directive? No Yes Yes No Yes  Type of Advance Directive - [place];Living will [place] - -  Does patient want to make changes to medical advance directive? - No - Patient declined No - Patient declined - -  Copy of [place] in Chart? - No - copy requested No - copy requested - No - copy requested  Would patient like information on creating a medical advance directive? No - Patient declined - No - Patient declined No - Patient declined -    Current Medications (verified) Outpatient Encounter Medications as of 07/27/2020  Medication Sig  . amLODipine (NORVASC) 5 MG tablet TAKE 1 TABLET BY MOUTH TWICE DAILY.  Marland Kitchen aspirin 81 MG tablet Take 1 tablet (81 mg total) by mouth daily.  Marland Kitchen atorvastatin (LIPITOR) 40 MG tablet Take 1 tablet (40 mg total) by mouth daily. SCHEDULE OFFICE VISIT (PHYSICAL) FOR ADDITIONAL REFILLS  . blood glucose meter kit and supplies Dispense based on patient and insurance preference. Use up to four times daily as directed. (FOR ICD-10 E10.9, E11.9).  Marland Kitchen docusate sodium (COLACE) 100 MG capsule Take 1 capsule (100 mg total) by mouth 2 (two) times daily.  Marland Kitchen donepezil (ARICEPT) 5 MG tablet TAKE ONE TABLET AT BEDTIME.  Marland Kitchen gabapentin (NEURONTIN) 300 MG capsule TAKE (1) CAPSULE TWICE DAILY.  Marland Kitchen glipiZIDE (GLUCOTROL) 5 MG tablet TAKE (1) TABLET TWICE A DAY BEFORE MEALS.  Marland Kitchen ibuprofen (ADVIL,MOTRIN) 200 MG tablet Take 400 mg by mouth at bedtime as needed (RLS).  Marland Kitchen JANUVIA 100 MG tablet TAKE 1 TABLET ONCE DAILY.  Marland Kitchen levothyroxine (SYNTHROID) 175 MCG tablet TAKE 1  TABLET BEFORE BREAKFAST.  Marland Kitchen meclizine (ANTIVERT) 25 MG tablet Take 1 tablet (25 mg total) by mouth 3 (three) times daily as needed for dizziness.  . metFORMIN (GLUCOPHAGE) 500 MG tablet TAKE 1 TABLET BY MOUTH TWICE DAILY.  . Multiple Vitamin (MULTIVITAMIN WITH MINERALS) TABS tablet Take 1 tablet by mouth daily.  Marland Kitchen omeprazole (PRILOSEC) 40 MG capsule TAKE (1) CAPSULE DAILY.  Marland Kitchen sertraline (ZOLOFT) 50 MG tablet TAKE 1 TABLET ONCE DAILY.  . traMADol (ULTRAM) 50 MG tablet Take 2 tablets (100 mg total) by mouth every 6 (six) hours as needed for moderate pain.   No facility-administered encounter medications on file as of 07/27/2020.    Allergies (verified) Ativan [lorazepam], Diclofenac sodium, Feldene [piroxicam], Latex, and Tape   History: Past Medical History:  Diagnosis Date  . ABSCESS 12/18/2009  . DIABETES MELLITUS, TYPE II 05/22/2009  . Diverticulosis   . Hemorrhoids   . HYPERGLYCEMIA 05/22/2009  . HYPERTENSION 04/13/2007  . HYPOTHYROIDISM 04/13/2007  . NEOPLASM, SKIN, UNCERTAIN BEHAVIOR 09/02/7351  . Stroke ([place])   . Tubular adenoma of colon 02/2001   Past Surgical History:  Procedure Laterality Date  . ABDOMINAL HYSTERECTOMY    . APPENDECTOMY    . CHOLECYSTECTOMY    . COLONOSCOPY  04-05-04   per Dr. Deatra Ina, no polyps   . OOPHORECTOMY    . THYROIDECTOMY     secondary to nodule, no cancer  .  TONSILLECTOMY AND ADENOIDECTOMY     Family History  Problem Relation Age of Onset  . Stroke Father   . Hypertension Mother   . Stroke Mother   . Huntington's disease Daughter 99  . Colon cancer Neg Hx    Social History   Socioeconomic History  . Marital status: Widowed    Spouse name: Not on file  . Number of children: 3  . Years of education: Not on file  . Highest education level: Not on file  Occupational History  . Occupation: Retired  Tobacco Use  . Smoking status: Never Smoker  . Smokeless tobacco: Never Used  Substance and Sexual Activity  . Alcohol use: No     Alcohol/week: 0.0 standard drinks  . Drug use: No  . Sexual activity: Not on file  Other Topics Concern  . Not on file  Social History Narrative   Daily caffeine    Social Determinants of Health   Financial Resource Strain: Not on file  Food Insecurity: Not on file  Transportation Needs: Not on file  Physical Activity: Not on file  Stress: Not on file  Social Connections: Not on file    Tobacco Counseling Counseling given: Not Answered   Clinical Intake:                 Diabetic?yes Nutrition Risk Assessment:  Has the patient had any N/V/D within the last 2 months?  {YES/NO:21197} Does the patient have any non-healing wounds?  {YES/NO:21197} Has the patient had any unintentional weight loss or weight gain?  {YES/NO:21197}  Diabetes:  Is the patient diabetic?  Yes  If diabetic, was a CBG obtained today?  No  Did the patient bring in their glucometer from home?  No  How often do you monitor your CBG's? ***.   Financial Strains and Diabetes Management:  Are you having any financial strains with the device, your supplies or your medication? {YES/NO:21197}.  Does the patient want to be seen by Chronic Care Management for management of their diabetes?  {YES/NO:21197} Would the patient like to be referred to a Nutritionist or for Diabetic Management?  {YES/NO:21197}  Diabetic Exams:  Diabetic Eye Exam: Overdue for diabetic eye exam. Pt has been advised about the importance in completing this exam. Patient advised to call and schedule an eye exam. Diabetic Foot Exam: Overdue, Pt has been advised about the importance in completing this exam. Pt is scheduled for diabetic foot exam on ***.          Activities of Daily Living No flowsheet data found.  Patient Care Team: Laurey Morale, MD as PCP - General (Family Medicine)  Indicate any recent Medical Services you may have received from other than [facility] providers in the past year (date may be  approximate).     Assessment:   This is a routine wellness examination for [place].  Hearing/Vision screen No exam data present  Dietary issues and exercise activities discussed:    Goals   None    Depression Screen PHQ 2/9 Scores 04/09/2020 09/06/2014 07/11/2013  PHQ - 2 Score 0 0 0    Fall Risk Fall Risk  04/09/2020 07/10/2016 07/10/2016 12/19/2015 09/06/2014  Falls in the past year? 0 Yes Yes No No  Number falls in past yr: 0 1 1 - -  Injury with Fall? 0 Yes No - -  Risk for fall due to : - History of fall(s);Impaired balance/gait - - -  Follow up - Education provided;Falls prevention discussed - - -  FALL RISK PREVENTION PERTAINING TO THE HOME:  Any stairs in or around the home? {YES/NO:21197} If so, are there any without handrails? No  Home free of loose throw rugs in walkways, pet beds, electrical cords, etc? Yes  Adequate lighting in your home to reduce risk of falls? Yes   ASSISTIVE DEVICES UTILIZED TO PREVENT FALLS:  Life alert? {YES/NO:21197} Use of a cane, walker or w/c? {YES/NO:21197} Grab bars in the bathroom? {YES/NO:21197} Shower chair or bench in shower? {YES/NO:21197} Elevated toilet seat or a handicapped toilet? {YES/NO:21197}  TIMED UP AND GO:  Was the test performed? Yes .  Length of time to ambulate 10 feet: *** sec.   {Appearance of FEOF:1219758}  Cognitive Function:        Immunizations Immunization History  Administered Date(s) Administered  . Fluad Quad(high Dose 65+) 03/17/2019, 02/07/2020  . Influenza Split 03/24/2011, 02/17/2012  . Influenza Whole 03/17/2007, 02/10/2008, 04/04/2009, 04/09/2010  . Influenza, High Dose Seasonal PF 04/01/2013, 03/12/2015, 03/12/2016, 03/10/2018  . Influenza-Unspecified 04/04/2014  . Pneumococcal Conjugate-13 05/16/2016  . Pneumococcal Polysaccharide-23 06/26/1998  . Zoster 12/03/2011    TDAP status: Due, Education has been provided regarding the importance of this vaccine. Advised may receive  this vaccine at local pharmacy or Health Dept. Aware to provide a copy of the vaccination record if obtained from local pharmacy or Health Dept. Verbalized acceptance and understanding.  Flu Vaccine status: Up to date  Pneumococcal vaccine status: Up to date  Covid-19 vaccine status: Completed vaccines  Qualifies for Shingles Vaccine? Yes   Zostavax completed Yes   Shingrix Completed?: No.    Education has been provided regarding the importance of this vaccine. Patient has been advised to call insurance company to determine out of pocket expense if they have not yet received this vaccine. Advised may also receive vaccine at local pharmacy or Health Dept. Verbalized acceptance and understanding.  Screening Tests Health Maintenance  Topic Date Due  . TETANUS/TDAP  Never done  . DEXA SCAN  Never done  . FOOT EXAM  04/25/2014  . URINE MICROALBUMIN  04/25/2014  . OPHTHALMOLOGY EXAM  03/18/2018  . COVID-19 Vaccine (2 - Moderna risk 4-dose series) 07/11/2019  . HEMOGLOBIN A1C  10/07/2020  . INFLUENZA VACCINE  Completed  . PNA vac Low Risk Adult  Completed  . HPV VACCINES  Aged Out    Health Maintenance  Health Maintenance Due  Topic Date Due  . TETANUS/TDAP  Never done  . DEXA SCAN  Never done  . FOOT EXAM  04/25/2014  . URINE MICROALBUMIN  04/25/2014  . OPHTHALMOLOGY EXAM  03/18/2018  . COVID-19 Vaccine (2 - Moderna risk 4-dose series) 07/11/2019    Colorectal cancer screening: No longer required.   Mammogram status: No longer required due to age.  Bone Density Status: No longer required due to age   Lung Cancer Screening: (Low Dose CT Chest recommended if Age 85-80 years, 30 pack-year currently smoking OR have quit w/in 15years.) does not qualify.   Lung Cancer Screening Referral: N/A   Additional Screening:  Hepatitis C Screening: does not qualify;   Vision Screening: Recommended annual ophthalmology exams for early detection of glaucoma and other disorders of the  eye. Is the patient up to date with their annual eye exam?  {YES/NO:21197} Who is the provider or what is the name of the office in which the patient attends annual eye exams? *** If pt is not established with a provider, would they like to be referred to a provider  to establish care? {YES/NO:21197}.   Dental Screening: Recommended annual dental exams for proper oral hygiene  Community Resource Referral / Chronic Care Management: CRR required this visit?  No   CCM required this visit?  No      Plan:     I have personally reviewed and noted the following in the patient's chart:   . Medical and social history . Use of alcohol, tobacco or illicit drugs  . Current medications and supplements . Functional ability and status . Nutritional status . Physical activity . Advanced directives . List of other physicians . Hospitalizations, surgeries, and ER visits in previous 12 months . Vitals . Screenings to include cognitive, depression, and falls . Referrals and appointments  In addition, I have reviewed and discussed with patient certain preventive protocols, quality metrics, and best practice recommendations. A written personalized care plan for preventive services as well as general preventive health recommendations were provided to patient.     Ofilia Neas, LPN   06/01/4097   Nurse Notes: None

## 2020-07-27 ENCOUNTER — Ambulatory Visit: Payer: Medicare HMO

## 2020-07-27 DIAGNOSIS — Z0000 Encounter for general adult medical examination without abnormal findings: Secondary | ICD-10-CM

## 2020-07-30 DIAGNOSIS — Z20828 Contact with and (suspected) exposure to other viral communicable diseases: Secondary | ICD-10-CM | POA: Diagnosis not present

## 2020-08-13 DIAGNOSIS — Z20828 Contact with and (suspected) exposure to other viral communicable diseases: Secondary | ICD-10-CM | POA: Diagnosis not present

## 2020-08-15 DIAGNOSIS — H00014 Hordeolum externum left upper eyelid: Secondary | ICD-10-CM | POA: Diagnosis not present

## 2020-08-24 DIAGNOSIS — H9313 Tinnitus, bilateral: Secondary | ICD-10-CM | POA: Diagnosis not present

## 2020-08-24 DIAGNOSIS — H903 Sensorineural hearing loss, bilateral: Secondary | ICD-10-CM | POA: Diagnosis not present

## 2020-09-03 DIAGNOSIS — Z20828 Contact with and (suspected) exposure to other viral communicable diseases: Secondary | ICD-10-CM | POA: Diagnosis not present

## 2020-09-10 DIAGNOSIS — Z20828 Contact with and (suspected) exposure to other viral communicable diseases: Secondary | ICD-10-CM | POA: Diagnosis not present

## 2020-09-24 DIAGNOSIS — Z20828 Contact with and (suspected) exposure to other viral communicable diseases: Secondary | ICD-10-CM | POA: Diagnosis not present

## 2020-09-25 DIAGNOSIS — M170 Bilateral primary osteoarthritis of knee: Secondary | ICD-10-CM | POA: Diagnosis not present

## 2020-10-01 DIAGNOSIS — Z20828 Contact with and (suspected) exposure to other viral communicable diseases: Secondary | ICD-10-CM | POA: Diagnosis not present

## 2020-10-04 ENCOUNTER — Telehealth: Payer: Self-pay | Admitting: Family Medicine

## 2020-10-04 NOTE — Telephone Encounter (Signed)
Left message for patient to call back and schedule Medicare Annual Wellness Visit (AWV) either virtually or in office.   AWV-I per [place] 05/26/09 please schedule at anytime with [facility] Nurse Health Advisor 1 or 2   This should be a 45 minute visit. 

## 2020-10-08 DIAGNOSIS — Z20828 Contact with and (suspected) exposure to other viral communicable diseases: Secondary | ICD-10-CM | POA: Diagnosis not present

## 2020-10-15 DIAGNOSIS — Z20828 Contact with and (suspected) exposure to other viral communicable diseases: Secondary | ICD-10-CM | POA: Diagnosis not present

## 2020-10-22 DIAGNOSIS — Z20828 Contact with and (suspected) exposure to other viral communicable diseases: Secondary | ICD-10-CM | POA: Diagnosis not present

## 2020-10-25 ENCOUNTER — Other Ambulatory Visit: Payer: Self-pay

## 2020-10-26 ENCOUNTER — Encounter: Payer: Self-pay | Admitting: Family Medicine

## 2020-10-26 ENCOUNTER — Ambulatory Visit (INDEPENDENT_AMBULATORY_CARE_PROVIDER_SITE_OTHER): Payer: Medicare HMO | Admitting: Family Medicine

## 2020-10-26 VITALS — BP 124/80 | HR 79 | Temp 98.5°F | Wt 190.0 lb

## 2020-10-26 DIAGNOSIS — R350 Frequency of micturition: Secondary | ICD-10-CM

## 2020-10-26 DIAGNOSIS — N3281 Overactive bladder: Secondary | ICD-10-CM | POA: Diagnosis not present

## 2020-10-26 LAB — POC URINALSYSI DIPSTICK (AUTOMATED)
Bilirubin, UA: NEGATIVE
Blood, UA: NEGATIVE
Glucose, UA: NEGATIVE
Ketones, UA: NEGATIVE
Leukocytes, UA: NEGATIVE
Nitrite, UA: NEGATIVE
Protein, UA: POSITIVE — AB
Spec Grav, UA: 1.025 (ref 1.010–1.025)
Urobilinogen, UA: 0.2 E.U./dL
pH, UA: 6 (ref 5.0–8.0)

## 2020-10-26 MED ORDER — OXYBUTYNIN CHLORIDE ER 5 MG PO TB24: 5.0000 mg | ORAL_TABLET | Freq: Every day | ORAL | 2 refills | Status: DC

## 2020-10-26 NOTE — Progress Notes (Signed)
   Subjective:    Patient ID: Amy [name], female    DOB: [birth year], 87 y.o.   MRN: [id number]  HPI Here to work on urinary urgency and frequency. This has been bothering her for a year or so. No burning or pain. This is more of a problem at night. She often has to get up to urinate 4 or 5 times during the night, even though she restricts her fluid intake after supper. Her UA today is clear.    Review of Systems  Constitutional: Negative.   Respiratory: Negative.   Cardiovascular: Negative.   Genitourinary: Positive for frequency and urgency. Negative for dysuria, flank pain and hematuria.       Objective:   Physical Exam Constitutional:      Appearance: Normal appearance.  Cardiovascular:     Rate and Rhythm: Normal rate and regular rhythm.     Pulses: Normal pulses.     Heart sounds: Normal heart sounds.  Pulmonary:     Effort: Pulmonary effort is normal.     Breath sounds: Normal breath sounds.  Abdominal:     General: Abdomen is flat. Bowel sounds are normal. There is no distension.     Palpations: Abdomen is soft. There is no mass.     Tenderness: There is no abdominal tenderness. There is no right CVA tenderness, left CVA tenderness, guarding or rebound.     Hernia: No hernia is present.  Neurological:     Mental Status: She is alert.           Assessment & Plan:  OAB, she will try Oxybutynin ER 5 mg daily at bedtime. Report back in 2 weeks.  [name], MD

## 2020-10-29 DIAGNOSIS — Z20828 Contact with and (suspected) exposure to other viral communicable diseases: Secondary | ICD-10-CM | POA: Diagnosis not present

## 2020-11-01 DIAGNOSIS — Z20828 Contact with and (suspected) exposure to other viral communicable diseases: Secondary | ICD-10-CM | POA: Diagnosis not present

## 2020-11-08 DIAGNOSIS — Z20828 Contact with and (suspected) exposure to other viral communicable diseases: Secondary | ICD-10-CM | POA: Diagnosis not present

## 2020-11-12 DIAGNOSIS — Z20828 Contact with and (suspected) exposure to other viral communicable diseases: Secondary | ICD-10-CM | POA: Diagnosis not present

## 2020-11-15 DIAGNOSIS — Z20828 Contact with and (suspected) exposure to other viral communicable diseases: Secondary | ICD-10-CM | POA: Diagnosis not present

## 2020-11-16 DIAGNOSIS — H5203 Hypermetropia, bilateral: Secondary | ICD-10-CM | POA: Diagnosis not present

## 2020-11-16 DIAGNOSIS — H35033 Hypertensive retinopathy, bilateral: Secondary | ICD-10-CM | POA: Diagnosis not present

## 2020-11-16 DIAGNOSIS — I10 Essential (primary) hypertension: Secondary | ICD-10-CM | POA: Diagnosis not present

## 2020-11-16 DIAGNOSIS — H52223 Regular astigmatism, bilateral: Secondary | ICD-10-CM | POA: Diagnosis not present

## 2020-11-16 DIAGNOSIS — H25043 Posterior subcapsular polar age-related cataract, bilateral: Secondary | ICD-10-CM | POA: Diagnosis not present

## 2020-11-16 DIAGNOSIS — E119 Type 2 diabetes mellitus without complications: Secondary | ICD-10-CM | POA: Diagnosis not present

## 2020-11-16 DIAGNOSIS — H26493 Other secondary cataract, bilateral: Secondary | ICD-10-CM | POA: Diagnosis not present

## 2020-11-16 DIAGNOSIS — H524 Presbyopia: Secondary | ICD-10-CM | POA: Diagnosis not present

## 2020-11-16 LAB — HM DIABETES EYE EXAM

## 2020-11-19 DIAGNOSIS — Z03818 Encounter for observation for suspected exposure to other biological agents ruled out: Secondary | ICD-10-CM | POA: Diagnosis not present

## 2020-11-22 DIAGNOSIS — Z20828 Contact with and (suspected) exposure to other viral communicable diseases: Secondary | ICD-10-CM | POA: Diagnosis not present

## 2020-11-27 DIAGNOSIS — Z20828 Contact with and (suspected) exposure to other viral communicable diseases: Secondary | ICD-10-CM | POA: Diagnosis not present

## 2020-11-29 DIAGNOSIS — Z20828 Contact with and (suspected) exposure to other viral communicable diseases: Secondary | ICD-10-CM | POA: Diagnosis not present

## 2020-11-30 ENCOUNTER — Other Ambulatory Visit: Payer: Self-pay | Admitting: Family Medicine

## 2020-12-03 DIAGNOSIS — Z20828 Contact with and (suspected) exposure to other viral communicable diseases: Secondary | ICD-10-CM | POA: Diagnosis not present

## 2020-12-20 DIAGNOSIS — Z20828 Contact with and (suspected) exposure to other viral communicable diseases: Secondary | ICD-10-CM | POA: Diagnosis not present

## 2020-12-24 DIAGNOSIS — Z20828 Contact with and (suspected) exposure to other viral communicable diseases: Secondary | ICD-10-CM | POA: Diagnosis not present

## 2020-12-26 ENCOUNTER — Ambulatory Visit: Payer: Medicare HMO | Admitting: Family Medicine

## 2020-12-27 DIAGNOSIS — Z20828 Contact with and (suspected) exposure to other viral communicable diseases: Secondary | ICD-10-CM | POA: Diagnosis not present

## 2020-12-28 ENCOUNTER — Encounter: Payer: Self-pay | Admitting: Family Medicine

## 2020-12-28 ENCOUNTER — Ambulatory Visit (INDEPENDENT_AMBULATORY_CARE_PROVIDER_SITE_OTHER): Payer: Medicare HMO | Admitting: Family Medicine

## 2020-12-28 ENCOUNTER — Other Ambulatory Visit: Payer: Self-pay

## 2020-12-28 VITALS — BP 130/78 | HR 84 | Temp 98.9°F

## 2020-12-28 DIAGNOSIS — M25562 Pain in left knee: Secondary | ICD-10-CM | POA: Diagnosis not present

## 2020-12-28 DIAGNOSIS — S8002XA Contusion of left knee, initial encounter: Secondary | ICD-10-CM

## 2020-12-28 NOTE — Progress Notes (Signed)
   Subjective:    Patient ID: Amy [name], female    DOB: [birth year], 87 y.o.   MRN: [id number]  HPI Here for an injury to the left knee that occurred one week ago. She fell on the knee, and she cannot remember exactly why she fell. She was standing and when she turned to her side, she went down. No LOC. Since then she has had significant pain and swelling in the knee.    Review of Systems  Constitutional: Negative.   Respiratory: Negative.    Cardiovascular: Negative.   Musculoskeletal:  Positive for arthralgias.      Objective:   Physical Exam Constitutional:      Comments: In pain, walks with a cane   Cardiovascular:     Rate and Rhythm: Normal rate and regular rhythm.     Pulses: Normal pulses.     Heart sounds: Normal heart sounds.  Pulmonary:     Effort: Pulmonary effort is normal.     Breath sounds: Normal breath sounds.  Musculoskeletal:     Comments: The left knee is swollen. There is faint ecchymosis in the anterior lower leg from the patella halfway down to the ankle. The patella is intact. She is very tender in both joint spaces and in the anterior knee just inferior to the patella. ROM is full, although she has pain with extremes of flexion and extension  Neurological:     Mental Status: She is alert.          Assessment & Plan:  She has injuries to the left knee, likely torn cartilage and possibly a tibial plateau fracture. We will refer her urgently to Orthopedics to be seen today.  [name], MD

## 2021-01-02 ENCOUNTER — Other Ambulatory Visit: Payer: Self-pay | Admitting: Family Medicine

## 2021-01-03 ENCOUNTER — Telehealth: Payer: Self-pay

## 2021-01-03 DIAGNOSIS — Z20828 Contact with and (suspected) exposure to other viral communicable diseases: Secondary | ICD-10-CM | POA: Diagnosis not present

## 2021-01-03 NOTE — Telephone Encounter (Signed)
Called patient to inform change in appt time and provider

## 2021-01-04 ENCOUNTER — Ambulatory Visit: Payer: Medicare HMO | Admitting: Family Medicine

## 2021-01-04 ENCOUNTER — Ambulatory Visit (INDEPENDENT_AMBULATORY_CARE_PROVIDER_SITE_OTHER): Payer: Medicare HMO | Admitting: Adult Health

## 2021-01-04 ENCOUNTER — Other Ambulatory Visit: Payer: Self-pay

## 2021-01-04 ENCOUNTER — Encounter: Payer: Self-pay | Admitting: Adult Health

## 2021-01-04 VITALS — BP 110/80 | HR 94 | Temp 98.7°F | Ht 63.0 in

## 2021-01-04 DIAGNOSIS — R350 Frequency of micturition: Secondary | ICD-10-CM | POA: Diagnosis not present

## 2021-01-04 LAB — POCT URINALYSIS DIPSTICK
Bilirubin, UA: NEGATIVE
Blood, UA: NEGATIVE
Glucose, UA: NEGATIVE
Ketones, UA: NEGATIVE
Leukocytes, UA: NEGATIVE
Nitrite, UA: NEGATIVE
Protein, UA: POSITIVE — AB
Spec Grav, UA: 1.025 (ref 1.010–1.025)
Urobilinogen, UA: 0.2 E.U./dL
pH, UA: 6 (ref 5.0–8.0)

## 2021-01-04 NOTE — Patient Instructions (Signed)
It was great meeting you today   Your urinalysis today shows that you do not have a urinary tract infection.

## 2021-01-04 NOTE — Progress Notes (Signed)
Subjective:    Patient ID: Amy [name], female    DOB: 1929/10/25, 87 y.o.   MRN: [id number]  HPI 87 year old female who  has a past medical history of ABSCESS (12/18/2009), DIABETES MELLITUS, TYPE II (05/22/2009), Diverticulosis, Hemorrhoids, HYPERGLYCEMIA (05/22/2009), HYPERTENSION (04/13/2007), HYPOTHYROIDISM (04/13/2007), NEOPLASM, SKIN, UNCERTAIN BEHAVIOR (0/56/9794), Stroke ([place]), and Tubular adenoma of colon (02/2001).  She presents to the office today for an acute issue. She is a patient of Dr. Sarajane Jews who I am seeing today. She is lives as a retirement community and was advised by staff to come see if she had a UTI because " I was told that I was acting like I didn't feel well". She reports feeling fine. She does have urinary frequency and urgency at baseline due to OAB. She stopped taking Ditropan because she didn't notice any improvement.   She denies dysuria, hematuria, lower back  pain ( past baseline) pelvic pain or pressure.   She does not have frequent UTI's   Review of Systems See HPI   Past Medical History:  Diagnosis Date   ABSCESS 12/18/2009   DIABETES MELLITUS, TYPE II 05/22/2009   Diverticulosis    Hemorrhoids    HYPERGLYCEMIA 05/22/2009   HYPERTENSION 04/13/2007   HYPOTHYROIDISM 04/13/2007   NEOPLASM, SKIN, UNCERTAIN BEHAVIOR 12/25/6551   Stroke ([place])    Tubular adenoma of colon 02/2001    Social History   Socioeconomic History   Marital status: Widowed    Spouse name: Not on file   Number of children: 3   Years of education: Not on file   Highest education level: Not on file  Occupational History   Occupation: Retired  Tobacco Use   Smoking status: Never   Smokeless tobacco: Never  Substance and Sexual Activity   Alcohol use: No    Alcohol/week: 0.0 standard drinks   Drug use: No   Sexual activity: Not on file  Other Topics Concern   Not on file  Social History Narrative   Daily caffeine    Social Determinants of Health   Financial  Resource Strain: Not on file  Food Insecurity: Not on file  Transportation Needs: Not on file  Physical Activity: Not on file  Stress: Not on file  Social Connections: Not on file  Intimate Partner Violence: Not on file    Past Surgical History:  Procedure Laterality Date   ABDOMINAL HYSTERECTOMY     APPENDECTOMY     CHOLECYSTECTOMY     COLONOSCOPY  04-05-04   per Dr. Deatra Ina, no polyps    OOPHORECTOMY     THYROIDECTOMY     secondary to nodule, no cancer   TONSILLECTOMY AND ADENOIDECTOMY      Family History  Problem Relation Age of Onset   Stroke Father    Hypertension Mother    Stroke Mother    Huntington's disease Daughter 53   Colon cancer Neg Hx     Allergies  Allergen Reactions   Ativan [Lorazepam] Other (See Comments)    Caused delirium, confusion, disorientation, slightly increased agitation.   Diclofenac Sodium Nausea And Vomiting   Feldene [Piroxicam] Other (See Comments)    Dizzy   Latex Rash   Tape Rash    Current Outpatient Medications on File Prior to Visit  Medication Sig Dispense Refill   amLODipine (NORVASC) 5 MG tablet TAKE 1 TABLET BY MOUTH TWICE DAILY. 56 tablet 1   aspirin 81 MG tablet Take 1 tablet (81 mg total) by mouth daily. [place]  tablet 0   atorvastatin (LIPITOR) 40 MG tablet Take 1 tablet (40 mg total) by mouth daily. SCHEDULE OFFICE VISIT (PHYSICAL) FOR ADDITIONAL REFILLS 30 tablet 11   blood glucose meter kit and supplies Dispense based on patient and insurance preference. Use up to four times daily as directed. (FOR ICD-10 E10.9, E11.9). 1 each 0   docusate sodium (COLACE) 100 MG capsule Take 1 capsule (100 mg total) by mouth 2 (two) times daily. 10 capsule 0   donepezil (ARICEPT) 5 MG tablet TAKE ONE TABLET AT BEDTIME. 28 tablet 1   gabapentin (NEURONTIN) 300 MG capsule TAKE (1) CAPSULE TWICE DAILY. 56 capsule 1   glipiZIDE (GLUCOTROL) 5 MG tablet TAKE (1) TABLET TWICE A DAY BEFORE MEALS. 56 tablet 1   ibuprofen (ADVIL,MOTRIN) 200 MG  tablet Take 400 mg by mouth at bedtime as needed (RLS).     JANUVIA 100 MG tablet TAKE 1 TABLET ONCE DAILY. 28 tablet 1   levothyroxine (SYNTHROID) 175 MCG tablet TAKE 1 TABLET BEFORE BREAKFAST.*appointment required for future refills 90 tablet 0   meclizine (ANTIVERT) 25 MG tablet Take 1 tablet (25 mg total) by mouth 3 (three) times daily as needed for dizziness. 30 tablet 0   metFORMIN (GLUCOPHAGE) 500 MG tablet TAKE 1 TABLET BY MOUTH TWICE DAILY. 180 tablet 3   Multiple Vitamin (MULTIVITAMIN WITH MINERALS) TABS tablet Take 1 tablet by mouth daily.     omeprazole (PRILOSEC) 40 MG capsule TAKE (1) CAPSULE DAILY. 28 capsule 1   oxybutynin (DITROPAN-XL) 5 MG 24 hr tablet Take 1 tablet (5 mg total) by mouth at bedtime. 30 tablet 2   sertraline (ZOLOFT) 50 MG tablet TAKE 1 TABLET ONCE DAILY. 28 tablet 1   traMADol (ULTRAM) 50 MG tablet Take 2 tablets (100 mg total) by mouth every 6 (six) hours as needed for moderate pain. 60 tablet 2   No current facility-administered medications on file prior to visit.    BP 110/80   Pulse 94   Temp 98.7 F (37.1 C) (Oral)   Ht 5' 3"  (1.6 m)   SpO2 95%   BMI 33.66 kg/m       Objective:   Physical Exam Vitals and nursing note reviewed.  Constitutional:      Appearance: Normal appearance.  Cardiovascular:     Pulses: Normal pulses.     Heart sounds: Normal heart sounds.  Pulmonary:     Effort: Pulmonary effort is normal.     Breath sounds: Normal breath sounds.  Skin:    General: Skin is warm and dry.  Neurological:     General: No focal deficit present.     Mental Status: She is alert and oriented to person, place, and time.  Psychiatric:        Mood and Affect: Mood normal.        Behavior: Behavior normal.        Thought Content: Thought content normal.        Judgment: Judgment normal.      Assessment & Plan:  1. Frequent urination  - POC Urinalysis Dipstick- negative for UTI  - Follow up PRN   Dorothyann Peng, NP

## 2021-01-07 DIAGNOSIS — Z20828 Contact with and (suspected) exposure to other viral communicable diseases: Secondary | ICD-10-CM | POA: Diagnosis not present

## 2021-01-16 ENCOUNTER — Other Ambulatory Visit: Payer: Self-pay | Admitting: Family Medicine

## 2021-01-17 DIAGNOSIS — Z8616 Personal history of COVID-19: Secondary | ICD-10-CM | POA: Diagnosis not present

## 2021-01-21 DIAGNOSIS — Z20828 Contact with and (suspected) exposure to other viral communicable diseases: Secondary | ICD-10-CM | POA: Diagnosis not present

## 2021-01-25 ENCOUNTER — Other Ambulatory Visit: Payer: Self-pay | Admitting: Family Medicine

## 2021-01-28 DIAGNOSIS — Z20828 Contact with and (suspected) exposure to other viral communicable diseases: Secondary | ICD-10-CM | POA: Diagnosis not present

## 2021-01-31 DIAGNOSIS — Z8616 Personal history of COVID-19: Secondary | ICD-10-CM | POA: Diagnosis not present

## 2021-02-04 DIAGNOSIS — Z20828 Contact with and (suspected) exposure to other viral communicable diseases: Secondary | ICD-10-CM | POA: Diagnosis not present

## 2021-02-07 DIAGNOSIS — Z8616 Personal history of COVID-19: Secondary | ICD-10-CM | POA: Diagnosis not present

## 2021-02-14 DIAGNOSIS — Z8616 Personal history of COVID-19: Secondary | ICD-10-CM | POA: Diagnosis not present

## 2021-02-19 DIAGNOSIS — M21861 Other specified acquired deformities of right lower leg: Secondary | ICD-10-CM | POA: Diagnosis not present

## 2021-02-19 DIAGNOSIS — M170 Bilateral primary osteoarthritis of knee: Secondary | ICD-10-CM | POA: Diagnosis not present

## 2021-02-19 DIAGNOSIS — M21862 Other specified acquired deformities of left lower leg: Secondary | ICD-10-CM | POA: Diagnosis not present

## 2021-03-13 ENCOUNTER — Telehealth: Payer: Self-pay | Admitting: *Deleted

## 2021-03-13 NOTE — Chronic Care Management (AMB) (Signed)
  Chronic Care Management   Outreach Note  03/13/2021 Name: Amy [name] MRN: [id number] DOB: [birth year]  Amy [name] is a 87 y.o. year old female who is a primary care patient of Laurey Morale, MD. I reached out to Maryann Conners by phone today in response to a referral sent by Ms. [place] primary care provider.  An unsuccessful telephone outreach was attempted today. The patient was referred to the case management team for assistance with care management and care coordination.   Follow Up Plan: A HIPAA compliant phone message was left for the patient providing contact information and requesting a return call.  The care management team will reach out to the patient again over the next 7 days.  If patient returns call to provider office, please advise to call [place]* at [phone].*  [place] Management  Direct Dial: [phone]

## 2021-03-19 DIAGNOSIS — Z20822 Contact with and (suspected) exposure to covid-19: Secondary | ICD-10-CM | POA: Diagnosis not present

## 2021-03-20 NOTE — Chronic Care Management (AMB) (Signed)
  Chronic Care Management   Outreach Note  03/20/2021 Name: [name] MRN: [id number] DOB: [birth year]  Amy [name] is a 87 y.o. year old female who is a primary care patient of Laurey Morale, MD. I reached out to Maryann Conners by phone today in response to a referral sent by Ms. Artist [place] primary care provider.  A second unsuccessful telephone outreach was attempted today. The patient was referred to the case management team for assistance with care management and care coordination.   Follow Up Plan: A HIPAA compliant phone message was left for the patient providing contact information and requesting a return call. The care management team will reach out to the patient again over the next 7 days.  If patient returns call to provider office, please advise to call [place] at [phone].  [place] Management  Direct Dial: [phone]

## 2021-03-21 ENCOUNTER — Other Ambulatory Visit: Payer: Self-pay | Admitting: Family Medicine

## 2021-03-21 NOTE — Chronic Care Management (AMB) (Signed)
  Chronic Care Management   Note  03/21/2021 Name: SENG FOUTS MRN: [id number] DOB: 12-Jul-1929  Amy [name] is a 87 y.o. year old female who is a primary care patient of Laurey Morale, MD. I reached out to Maryann Conners by phone today in response to a referral sent by Ms. Artist [place] PCP.  Ms. Starry was given information about Chronic Care Management services today including:  CCM service includes personalized support from designated clinical staff supervised by her physician, including individualized plan of care and coordination with other care providers 24/7 contact phone numbers for assistance for urgent and routine care needs. Service will only be billed when office clinical staff spend 20 minutes or more in a month to coordinate care. Only one practitioner may furnish and bill the service in a calendar month. The patient may stop CCM services at any time (effective at the end of the month) by phone call to the office staff. The patient is responsible for co-pay (up to 20% after annual deductible is met) if co-pay is required by the individual health plan.   Patient agreed to services and verbal consent obtained.   Follow up plan: Telephone appointment with care management team member scheduled for:04/04/21  [place] Management  Direct Dial: [phone]

## 2021-03-25 ENCOUNTER — Other Ambulatory Visit: Payer: Self-pay | Admitting: Family Medicine

## 2021-04-04 ENCOUNTER — Ambulatory Visit (INDEPENDENT_AMBULATORY_CARE_PROVIDER_SITE_OTHER): Payer: Medicare HMO

## 2021-04-04 DIAGNOSIS — I10 Essential (primary) hypertension: Secondary | ICD-10-CM

## 2021-04-04 DIAGNOSIS — E119 Type 2 diabetes mellitus without complications: Secondary | ICD-10-CM

## 2021-04-04 DIAGNOSIS — R413 Other amnesia: Secondary | ICD-10-CM

## 2021-04-04 NOTE — Patient Instructions (Addendum)
Visit Information  Type 2 Diabetes Mellitus, Self-Care, Adult When you have type 2 diabetes (type 2 diabetes mellitus), you must make sure your blood sugar (glucose) stays in a healthy range. You can do this with: Nutrition. Exercise. Lifestyle changes. Medicines or insulin, if needed. Support from your doctors and others. What are the risks? Having type 2 diabetes can raise your risk for other long-term (chronic) health problems. You may get medicines to help prevent these problems. How to stay aware of your blood sugar  Check your blood sugar level every day, as often as told. Have your A1C (hemoglobin A1C) level checked two or more times a year. Have it checked more often if told. Your doctor will set personal treatment goals for you. In general, you should have these blood sugar levels: Before meals: 80-130 mg/dL (4.4-7.2 mmol/L). After meals: below 180 mg/dL (10 mmol/L). A1C: less than 7%. How to manage high and low blood sugar Symptoms of high blood sugar High blood sugar is also called hyperglycemia. Know the symptoms of high blood sugar. These may include: More thirst. Hunger. Feeling very tired. Needing to pee (urinate) more often than normal. Seeing things blurry. Symptoms of low blood sugar Low blood sugar is also called hypoglycemia. This is when blood sugar is at or below 70 mg/dL (3.9 mmol/L). Symptoms may include: Hunger. Feeling worried or nervous (anxious). Feeling sweaty and cold to the touch (clammy). Being dizzy or light-headed. Feeling sleepy. A fast heartbeat. Feeling grouchy (irritable). Tingling or loss of feeling (numbness) around your mouth, lips, or tongue. Restless sleep. Diabetes medicines can cause low blood sugar. You are more at risk: While you exercise. After exercise. During sleep. When you are sick. When you skip meals or do not eat for a long time. Treating low blood sugar If you think you have low blood sugar, eat or drink something  sugary right away. Keep 15 grams of a fast-acting carb (carbohydrate) with you all the time. Make sure your family and friends know how to treat you if you cannot treat yourself. Treating very low blood sugar Severe hypoglycemia is when your blood sugar is at or below 54 mg/dL (3 mmol/L). Severe hypoglycemia is an emergency. Get medical help right away. Call your local emergency services (911 in the [place] Do not wait to see if the symptoms will go away. Do not drive yourself to the hospital. You may need a glucagon shot if you have very low blood sugar and you cannot eat or drink. Have a family member or friend learn how to check your blood sugar and how to give you a glucagon shot. Ask your doctor if you should have a kit for glucagon shots. Follow these instructions at home: Medicines Take prescribed insulin or diabetes medicines as told by your health care provider. Do not run out of insulin or other medicines. Plan ahead. If you use insulin, change the amount you take based on how active you are and what foods you eat. Your doctor will tell you how to do this. Take over-the-counter and prescription medicines only as told by your doctor. Eating and drinking  Eat healthy foods. These include: Low-fat (lean) proteins. Complex carbs, such as whole grains. Fresh fruits and vegetables. Low-fat dairy products. Healthy fats. Meet with a food expert (dietitian) to make an eating plan. Follow instructions from your doctor about what you cannot eat or drink. Drink enough fluid to keep your pee (urine) pale yellow. Keep track of carbs that you eat. Read  food labels and learn serving sizes of foods. Follow your sick-day plan when you cannot eat or drink as normal. Make this plan with your doctor so it is ready to use. Activity Exercise as told by your doctor. You may need to: Do stretching and strength exercises two or more times a week. Do 150 minutes or more of exercise each week that makes  your heart beat faster and makes you sweat. Spread out your exercise over 3 or more days a week. Do not go more than 2 days in a row without exercise. Talk with your doctor before you start a new exercise. Your doctor may tell you to change: How much insulin or medicines you take. How much food you eat. Lifestyle Do not smoke or use any products that contain nicotine or tobacco. If you need help quitting, ask your doctor. If you drink alcohol and your doctor says that it is safe for you: Limit how much you have to: 0-1 drink a day for women who are not pregnant. 0-2 drinks a day for men. Know how much alcohol is in your drink. In the [place] one drink equals one 12 oz bottle of beer (355 mL), one 5 oz glass of wine (148 mL), or one 1 oz glass of hard liquor (44 mL). Learn to deal with stress. If you need help, ask your doctor. Body care  Stay up to date with your shots (immunizations). Have your eyes and feet checked by a doctor as often as told. Check your skin and feet every day. Check for cuts, bruises, redness, blisters, or sores. Brush your teeth and gums two times a day. Floss one or more times a day. Go to the dentist one or more times every 6 months. Stay at a healthy weight. General instructions Share your diabetes care plan with: Your work or school. People you live with. Carry a card or wear jewelry that says you have diabetes. Keep all follow-up visits. Questions to ask your doctor Do I need to meet with a certified expert in diabetes education and care? Where can I find a support group? Where to find more information For help and guidance and more information about diabetes, please go to: American Diabetes Association: www.diabetes.org American Association of Diabetes Care and Education Specialists: www.diabeteseducator.org International Diabetes Federation: MemberVerification.ca Summary When you have type 2 diabetes, you must make sure your blood sugar (glucose) stays in a  healthy range. You can do this with nutrition, exercise, medicines and insulin, and support from doctors and others. Check your blood sugar every day, or as often as told. Having diabetes can raise your risk for other long-term health problems. You may get medicines to help prevent these problems. Share your diabetes management plan with people at work, school, and home. Keep all follow-up visits. This information is not intended to replace advice given to you by your health care provider. Make sure you discuss any questions you have with your health care provider. Document Revised: 08/06/2020 Document Reviewed: 08/06/2020 Elsevier Patient Education  2022 Alliance.  PATIENT GOALS/PLAN OF CARE:  Care Plan : RN Care Manager Plan of Care  Updates made by Dimitri Ped, RN since 04/04/2021 12:00 AM     Problem: Chronic Disease Management and Care Coordination Needs (DM2. HTN, Memory loss)   Priority: High     Long-Range Goal: Establish Plan of Care for Chronic Disease Management Needs (DM2, HTN, Memory Loss)   Start Date: 04/04/2021  Expected End Date: 10/01/2021  This Visit's Progress: On track  Priority: High  Note:   Current Barriers:  Chronic Disease Management support and education needs related to HTN, DMII, and Memory Loss  RNCM Clinical Goal(s):  Patient will verbalize understanding of plan for management of HTN, DMII, and Memory Loss verbalize basic understanding of  HTN, DMII, and Memory Loss disease process and self health management plan . take all medications exactly as prescribed and will call provider for medication related questions attend all scheduled medical appointments: no upcoming appointments scheduled demonstrate Ongoing adherence to prescribed treatment plan for HTN, DMII, and V as evidenced by readings within limits and adherence to plan of care continue to work with RN Care Manager to address care management and care coordination needs related to  HTN,  DMII, and Memory Loss  through collaboration with RN Care manager, provider, and care team.   Interventions: 1:1 collaboration with primary care provider regarding development and update of comprehensive plan of care as evidenced by provider attestation and co-signature Inter-disciplinary care team collaboration (see longitudinal plan of care) Evaluation of current treatment plan related to  self management and patient's adherence to plan as established by provider  Hypertension Interventions: New goal. Last practice recorded BP readings:  BP Readings from Last 3 Encounters:  01/04/21 110/80  12/28/20 130/78  10/26/20 124/80  Most recent eGFR/CrCl: No results found for: EGFR  No components found for: CRCL  Evaluation of current treatment plan related to hypertension self management and patient's adherence to plan as established by provider; Provided education to patient re: stroke prevention, s/s of heart attack and stroke; Reviewed medications with patient and discussed importance of compliance; Discussed plans with patient for ongoing care management follow up and provided patient with direct contact information for care management team; Advised patient, providing education and rationale, to monitor blood pressure daily and record, calling PCP for findings outside established parameters;  Memory LossNew goal. Evaluation of current treatment plan related to  Memory Loss , Memory Deficits self-management and patient's adherence to plan as established by provider. Discussed plans with patient for ongoing care management follow up and provided patient with direct contact information for care management team Evaluation of current treatment plan related to Memory Loss and patient's adherence to plan as established by provider; Provided education to patient re: Memory Loss;  Diabetes Interventions: New goal. Assessed patient's understanding of A1c goal: <7% Reviewed medications with patient  and discussed importance of medication adherence; Counseled on importance of regular laboratory monitoring as prescribed; Discussed plans with patient for ongoing care management follow up and provided patient with direct contact information for care management team; Provided patient with written educational materials related to hypo and hyperglycemia and importance of correct treatment; Advised patient, providing education and rationale, to check cbg 2-3 times a week and record, calling provider for findings outside established parameters; Review of patient status, including review of consultants reports, relevant laboratory and other test results, and medications completed; Lab Results  Component Value Date   HGBA1C 6.6 (H) 04/09/2020   Patient Goals/Self-Care Activities: Patient will self administer medications as prescribed Patient will attend all scheduled provider appointments Patient will call pharmacy for medication refills Patient will attend church or other social activities Patient will continue to perform ADL's independently Patient will continue to perform IADL's independently Patient will call provider office for new concerns or questions keep appointment with eye doctor check blood sugar at prescribed times: 2-3 times a year  check feet daily for cuts, sores or  redness take the blood sugar log to all doctor visits drink 6 to 8 glasses of water each day fill half of plate with vegetables manage portion size wash and dry feet carefully every day - check blood pressure weekly - choose a place to take my blood pressure (home, clinic or office, retail store) - take blood pressure log to all doctor appointments - call doctor for signs and symptoms of high blood pressure - keep all doctor appointments - begin an exercise program - limit salt intake to 2300 mg/day Follow Up Plan:  Telephone follow up appointment with care management team member scheduled for:  05/10/21 The  patient has been provided with contact information for the care management team and has been advised to call with any health related questions or concerns.         Consent to CCM Services: Ms. Hardrick was given information about Chronic Care Management services including:  CCM service includes personalized support from designated clinical staff supervised by her physician, including individualized plan of care and coordination with other care providers 24/7 contact phone numbers for assistance for urgent and routine care needs. Service will only be billed when office clinical staff spend 20 minutes or more in a month to coordinate care. Only one practitioner may furnish and bill the service in a calendar month. The patient may stop CCM services at any time (effective at the end of the month) by phone call to the office staff. The patient will be responsible for cost sharing (co-pay) of up to 20% of the service fee (after annual deductible is met).  Patient agreed to services and verbal consent obtained.   The patient verbalized understanding of instructions, educational materials, and care plan provided today and agreed to receive a mailed copy of patient instructions, educational materials, and care plan.   Telephone follow up appointment with care management team member scheduled for: 05/10/21 at 65 AM Peter Garter RN, [facility], CDE Care Management Coordinator [facility] [phone], Mobile [phone]

## 2021-04-04 NOTE — Chronic Care Management (AMB) (Signed)
Chronic Care Management   CCM RN Visit Note  04/04/2021 Name: Amy [name] MRN: [id number] DOB: [birth year]  Subjective: Amy [name] is a 87 y.o. year old female who is a primary care patient of Laurey Morale, MD. The care management team was consulted for assistance with disease management and care coordination needs.    Engaged with patient by telephone for initial visit in response to provider referral for case management and/or care coordination services.   Consent to Services:  The patient was given the following information about Chronic Care Management services today, agreed to services, and gave verbal consent: 1. CCM service includes personalized support from designated clinical staff supervised by the primary care provider, including individualized plan of care and coordination with other care providers 2. 24/7 contact phone numbers for assistance for urgent and routine care needs. 3. Service will only be billed when office clinical staff spend 20 minutes or more in a month to coordinate care. 4. Only one practitioner may furnish and bill the service in a calendar month. 5.The patient may stop CCM services at any time (effective at the end of the month) by phone call to the office staff. 6. The patient will be responsible for cost sharing (co-pay) of up to 20% of the service fee (after annual deductible is met). Patient agreed to services and consent obtained.  Patient agreed to services and verbal consent obtained.   Assessment: Review of patient past medical history, allergies, medications, health status, including review of consultants reports, laboratory and other test data, was performed as part of comprehensive evaluation and provision of chronic care management services.   SDOH (Social Determinants of Health) assessments and interventions performed:  SDOH Interventions    Flowsheet Row Most Recent Value  SDOH Interventions   Food Insecurity Interventions Intervention  Not Indicated  Financial Strain Interventions Intervention Not Indicated  Housing Interventions Intervention Not Indicated  Intimate Partner Violence Interventions Intervention Not Indicated  Stress Interventions Intervention Not Indicated  Social Connections Interventions Intervention Not Indicated  Transportation Interventions Intervention Not Indicated        CCM Care Plan  Allergies  Allergen Reactions   Ativan [Lorazepam] Other (See Comments)    Caused delirium, confusion, disorientation, slightly increased agitation.   Diclofenac Sodium Nausea And Vomiting   Feldene [Piroxicam] Other (See Comments)    Dizzy   Latex Rash   Tape Rash    Outpatient Encounter Medications as of 04/04/2021  Medication Sig   amLODipine (NORVASC) 5 MG tablet TAKE ONE TABLET BY MOUTH TWICE DAILY   aspirin 81 MG tablet Take 1 tablet (81 mg total) by mouth daily.   atorvastatin (LIPITOR) 40 MG tablet TAKE 1 TABLET ONCE DAILY.   blood glucose meter kit and supplies Dispense based on patient and insurance preference. Use up to four times daily as directed. (FOR ICD-10 E10.9, E11.9).   docusate sodium (COLACE) 100 MG capsule Take 1 capsule (100 mg total) by mouth 2 (two) times daily.   donepezil (ARICEPT) 5 MG tablet TAKE ONE TABLET BY MOUTH AT BEDTIME   gabapentin (NEURONTIN) 300 MG capsule TAKE ONE CAPSULE BY MOUTH TWICE DAILY   glipiZIDE (GLUCOTROL) 5 MG tablet TAKE ONE TABLET BY MOUTH TWICE DAILY BEFORE MEALS   ibuprofen (ADVIL,MOTRIN) 200 MG tablet Take 400 mg by mouth at bedtime as needed (RLS).   JANUVIA 100 MG tablet TAKE ONE TABLET BY MOUTH ONCE DAILY   levothyroxine (SYNTHROID) 175 MCG tablet TAKE ONE TABLET BY MOUTH EVERY MORNING  BEFORE BREAKFAST. **NEED OFFICE VISIT**   meclizine (ANTIVERT) 25 MG tablet Take 1 tablet (25 mg total) by mouth 3 (three) times daily as needed for dizziness.   metFORMIN (GLUCOPHAGE) 500 MG tablet TAKE 1 TABLET BY MOUTH TWICE DAILY.   Multiple Vitamin  (MULTIVITAMIN WITH MINERALS) TABS tablet Take 1 tablet by mouth daily.   omeprazole (PRILOSEC) 40 MG capsule TAKE ONE CAPSULE BY MOUTH EVERY DAY   oxybutynin (DITROPAN-XL) 5 MG 24 hr tablet Take 1 tablet (5 mg total) by mouth at bedtime.   sertraline (ZOLOFT) 50 MG tablet TAKE ONE TABLET BY MOUTH ONCE DAILY   traMADol (ULTRAM) 50 MG tablet TAKE 2 TABLETS EVERY 6 HOURS AS NEEDED FOR MODERATE PAIN.   No facility-administered encounter medications on file as of 04/04/2021.    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   Cerebral thrombosis with cerebral infarction 04/23/2016   Left homonymous hemianopsia 04/22/2016   Dizziness and giddiness 07/27/2015   Balance disorder 07/27/2015   Depression 03/12/2015   Abdominal pain, generalized 01/30/2015   LUQ abdominal pain 08/19/2014   Personal history of colonic polyps 06/18/2011   NEOPLASM, SKIN, UNCERTAIN BEHAVIOR 61/68/3729   Diabetes mellitus without complication ([place]) 07/06/1550   Hypothyroidism 04/13/2007   Essential hypertension 04/13/2007    Conditions to be addressed/monitored:HTN, DMII, and Memory Loss  Care Plan : RN Care Manager Plan of Care  Updates made by Dimitri Ped, RN since 04/04/2021 12:00 AM     Problem: Chronic Disease Management and Care Coordination Needs (DM2. HTN, Memory loss)   Priority: High     Long-Range Goal: Establish Plan of Care for Chronic Disease Management Needs (DM2, HTN, Memory Loss)   Start Date: 04/04/2021  Expected End Date: 10/01/2021  This Visit's Progress: On track  Priority: High  Note:   Current Barriers:  Chronic Disease Management support and education needs related to HTN, DMII, and Memory Loss  RNCM Clinical Goal(s):  Patient will verbalize understanding of plan for management of HTN, DMII, and Memory Loss verbalize basic  understanding of  HTN, DMII, and Memory Loss disease process and self health management plan . take all medications exactly as prescribed and will call provider for medication related questions attend all scheduled medical appointments: no upcoming appointments scheduled demonstrate Ongoing adherence to prescribed treatment plan for HTN, DMII, and V as evidenced by readings within limits and adherence to plan of care continue to work with RN Care Manager to address care management and care coordination needs related to  HTN, DMII, and Memory Loss  through collaboration with RN Care manager, provider, and care team.   Interventions: 1:1 collaboration with primary care provider regarding development and update of comprehensive plan of care as evidenced by provider attestation and co-signature Inter-disciplinary care team collaboration (see longitudinal plan of care) Evaluation of current treatment plan related to  self management and patient's adherence to plan as established by provider  Hypertension Interventions: New goal. Last practice recorded BP readings:  BP Readings from Last 3 Encounters:  01/04/21 110/80  12/28/20 130/78  10/26/20 124/80  Most recent eGFR/CrCl: No results found for: EGFR  No components found for: CRCL  Evaluation of current treatment plan related to hypertension self management and patient's adherence to plan as established by provider; Provided education to patient re: stroke prevention, s/s of heart attack and stroke; Reviewed  medications with patient and discussed importance of compliance; Discussed plans with patient for ongoing care management follow up and provided patient with direct contact information for care management team; Advised patient, providing education and rationale, to monitor blood pressure daily and record, calling PCP for findings outside established parameters;  Memory LossNew goal. Evaluation of current treatment plan related to  Memory  Loss , Memory Deficits self-management and patient's adherence to plan as established by provider. Discussed plans with patient for ongoing care management follow up and provided patient with direct contact information for care management team Evaluation of current treatment plan related to Memory Loss and patient's adherence to plan as established by provider; Provided education to patient re: Memory Loss;  Diabetes Interventions: New goal. Assessed patient's understanding of A1c goal: <7% Reviewed medications with patient and discussed importance of medication adherence; Counseled on importance of regular laboratory monitoring as prescribed; Discussed plans with patient for ongoing care management follow up and provided patient with direct contact information for care management team; Provided patient with written educational materials related to hypo and hyperglycemia and importance of correct treatment; Advised patient, providing education and rationale, to check cbg 2-3 times a week and record, calling provider for findings outside established parameters; Review of patient status, including review of consultants reports, relevant laboratory and other test results, and medications completed; Lab Results  Component Value Date   HGBA1C 6.6 (H) 04/09/2020   Patient Goals/Self-Care Activities: Patient will self administer medications as prescribed Patient will attend all scheduled provider appointments Patient will call pharmacy for medication refills Patient will attend church or other social activities Patient will continue to perform ADL's independently Patient will continue to perform IADL's independently Patient will call provider office for new concerns or questions keep appointment with eye doctor check blood sugar at prescribed times: 2-3 times a year  check feet daily for cuts, sores or redness take the blood sugar log to all doctor visits drink 6 to 8 glasses of water each  day fill half of plate with vegetables manage portion size wash and dry feet carefully every day - check blood pressure weekly - choose a place to take my blood pressure (home, clinic or office, retail store) - take blood pressure log to all doctor appointments - call doctor for signs and symptoms of high blood pressure - keep all doctor appointments - begin an exercise program - limit salt intake to 2300 mg/day Follow Up Plan:  Telephone follow up appointment with care management team member scheduled for:  05/10/21 The patient has been provided with contact information for the care management team and has been advised to call with any health related questions or concerns.         Plan:Telephone follow up appointment with care management team member scheduled for:  05/10/21 The patient has been provided with contact information for the care management team and has been advised to call with any health related questions or concerns.  Peter Garter RN, [facility], CDE Care Management Coordinator [facility] [phone], Mobile [phone]

## 2021-04-16 DIAGNOSIS — Z20822 Contact with and (suspected) exposure to covid-19: Secondary | ICD-10-CM | POA: Diagnosis not present

## 2021-04-17 DIAGNOSIS — H6123 Impacted cerumen, bilateral: Secondary | ICD-10-CM | POA: Diagnosis not present

## 2021-04-24 DIAGNOSIS — I10 Essential (primary) hypertension: Secondary | ICD-10-CM

## 2021-04-24 DIAGNOSIS — E119 Type 2 diabetes mellitus without complications: Secondary | ICD-10-CM | POA: Diagnosis not present

## 2021-04-24 DIAGNOSIS — Z7984 Long term (current) use of oral hypoglycemic drugs: Secondary | ICD-10-CM | POA: Diagnosis not present

## 2021-05-10 ENCOUNTER — Telehealth: Payer: Medicare HMO

## 2021-05-17 ENCOUNTER — Other Ambulatory Visit: Payer: Self-pay | Admitting: Family Medicine

## 2021-05-21 DIAGNOSIS — Z20822 Contact with and (suspected) exposure to covid-19: Secondary | ICD-10-CM | POA: Diagnosis not present

## 2021-06-03 ENCOUNTER — Encounter: Payer: Self-pay | Admitting: Family Medicine

## 2021-06-03 ENCOUNTER — Ambulatory Visit (INDEPENDENT_AMBULATORY_CARE_PROVIDER_SITE_OTHER): Payer: Medicare HMO | Admitting: Family Medicine

## 2021-06-03 VITALS — BP 118/74 | HR 75 | Temp 99.1°F

## 2021-06-03 DIAGNOSIS — K219 Gastro-esophageal reflux disease without esophagitis: Secondary | ICD-10-CM

## 2021-06-03 DIAGNOSIS — E039 Hypothyroidism, unspecified: Secondary | ICD-10-CM

## 2021-06-03 DIAGNOSIS — R110 Nausea: Secondary | ICD-10-CM | POA: Diagnosis not present

## 2021-06-03 DIAGNOSIS — L723 Sebaceous cyst: Secondary | ICD-10-CM

## 2021-06-03 DIAGNOSIS — I10 Essential (primary) hypertension: Secondary | ICD-10-CM

## 2021-06-03 DIAGNOSIS — E119 Type 2 diabetes mellitus without complications: Secondary | ICD-10-CM

## 2021-06-03 LAB — BASIC METABOLIC PANEL
BUN: 18 mg/dL (ref 6–23)
CO2: 28 mEq/L (ref 19–32)
Calcium: 9.5 mg/dL (ref 8.4–10.5)
Chloride: 103 mEq/L (ref 96–112)
Creatinine, Ser: 1.03 mg/dL (ref 0.40–1.20)
GFR: 48.25 mL/min — ABNORMAL LOW (ref >60.00)
Glucose, Bld: 147 mg/dL — ABNORMAL HIGH (ref 70–99)
Potassium: 4.1 mEq/L (ref 3.5–5.1)
Sodium: 139 mEq/L (ref 135–145)

## 2021-06-03 LAB — HEPATIC FUNCTION PANEL
ALT: 13 U/L (ref 0–35)
AST: 15 U/L (ref 0–37)
Albumin: 4.2 g/dL (ref 3.5–5.2)
Alkaline Phosphatase: 79 U/L (ref 39–117)
Bilirubin, Direct: 0.1 mg/dL (ref 0.0–0.3)
Total Bilirubin: 0.6 mg/dL (ref 0.2–1.2)
Total Protein: 7.7 g/dL (ref 6.0–8.3)

## 2021-06-03 LAB — CBC WITH DIFFERENTIAL/PLATELET
Basophils Absolute: 0 10*3/uL (ref 0.0–0.1)
Basophils Relative: 0.3 % (ref 0.0–3.0)
Eosinophils Absolute: 0.1 10*3/uL (ref 0.0–0.7)
Eosinophils Relative: 0.9 % (ref 0.0–5.0)
HCT: 35.9 % — ABNORMAL LOW (ref 36.0–46.0)
Hemoglobin: 11.4 g/dL — ABNORMAL LOW (ref 12.0–15.0)
Lymphocytes Relative: 29.9 % (ref 12.0–46.0)
Lymphs Abs: 2.2 10*3/uL (ref 0.7–4.0)
MCHC: 31.7 g/dL (ref 30.0–36.0)
MCV: 91.2 fl (ref 78.0–100.0)
Monocytes Absolute: 0.6 10*3/uL (ref 0.1–1.0)
Monocytes Relative: 8 % (ref 3.0–12.0)
Neutro Abs: 4.4 10*3/uL (ref 1.4–7.7)
Neutrophils Relative %: 60.9 % (ref 43.0–77.0)
Platelets: 222 10*3/uL (ref 150.0–400.0)
RBC: 3.94 Mil/uL (ref 3.87–5.11)
RDW: 14.6 % (ref 11.5–15.5)
WBC: 7.2 10*3/uL (ref 4.0–10.5)

## 2021-06-03 LAB — HEMOGLOBIN A1C: Hgb A1c MFr Bld: 7 % — ABNORMAL HIGH (ref 4.6–6.5)

## 2021-06-03 LAB — T3, FREE: T3, Free: 2.9 pg/mL (ref 2.3–4.2)

## 2021-06-03 LAB — T4, FREE: Free T4: 0.8 ng/dL (ref 0.60–1.60)

## 2021-06-03 LAB — TSH: TSH: 2.14 u[IU]/mL (ref 0.35–5.50)

## 2021-06-03 MED ORDER — OMEPRAZOLE 40 MG PO CPDR: 40.0000 mg | DELAYED_RELEASE_CAPSULE | Freq: Every morning | ORAL | 3 refills | Status: DC

## 2021-06-03 MED ORDER — FAMOTIDINE 40 MG PO TABS: 40.0000 mg | ORAL_TABLET | Freq: Every day | ORAL | 3 refills | Status: DC

## 2021-06-03 NOTE — Progress Notes (Signed)
° °  Subjective:    Patient ID: Amy [name], female    DOB: 1930-04-21, 88 y.o.   MRN: [id number]  HPI Here for several issues. First she complains of nausea every morning when she wakes up. Then she feels better after she has eaten something. No heartburn or trouble swallowing. She never vomits. She takes Omeprazole 40 mg every morning. Also she asks me to check a lump on the right shoulder that has been present for years. It is not tender and does not change. Sometimes she can squeeze a white substance from it. She also describes leg cramps, primarily in the calves. These occur at night.    Review of Systems  Constitutional: Negative.   Respiratory: Negative.    Cardiovascular: Negative.   Gastrointestinal:  Positive for nausea. Negative for abdominal distention, abdominal pain, blood in stool, constipation, diarrhea and vomiting.  Musculoskeletal:  Positive for myalgias.      Objective:   Physical Exam Constitutional:      Appearance: Normal appearance. She is not ill-appearing.  Cardiovascular:     Rate and Rhythm: Normal rate and regular rhythm.     Pulses: Normal pulses.     Heart sounds: Normal heart sounds.  Pulmonary:     Effort: Pulmonary effort is normal.     Breath sounds: Normal breath sounds.  Abdominal:     General: Abdomen is flat. Bowel sounds are normal. There is no distension.     Palpations: Abdomen is soft. There is no mass.     Tenderness: There is no abdominal tenderness. There is no guarding or rebound.     Hernia: No hernia is present.  Musculoskeletal:        General: No swelling or tenderness.     Right lower leg: No edema.     Left lower leg: No edema.  Skin:    Comments: There is a small non-tender sebaceous cyst on the lateral right shoulder  Neurological:     Mental Status: She is alert.          Assessment & Plan:  The morning nausea is likely due to night time GERD. She will stay on Omeprazole every morning, and we will add Pepcid 40 mg  at night. She has a benign sebaceous cyst on the right shoulder. We agreed to simply observe this for now. Finally she has nocturnal leg cramps. We will get labs to check electrolytes today. I asked her to take one or two 400 mg tablets of OTC magnesium at bedtime. We spent a total of ( 30  ) minutes reviewing records and discussing these issues.  [name], MD

## 2021-06-04 DIAGNOSIS — Z20822 Contact with and (suspected) exposure to covid-19: Secondary | ICD-10-CM | POA: Diagnosis not present

## 2021-06-14 ENCOUNTER — Other Ambulatory Visit: Payer: Self-pay | Admitting: Family Medicine

## 2021-06-14 DIAGNOSIS — E039 Hypothyroidism, unspecified: Secondary | ICD-10-CM

## 2021-06-20 DIAGNOSIS — Z20822 Contact with and (suspected) exposure to covid-19: Secondary | ICD-10-CM | POA: Diagnosis not present

## 2021-06-24 ENCOUNTER — Telehealth: Payer: Self-pay | Admitting: Family Medicine

## 2021-06-24 NOTE — Telephone Encounter (Signed)
Left message for patient to call back and schedule Medicare Annual Wellness Visit (AWV) either virtually or in office. Left  my jabber number [phone] ° ° °AWV-I per [place] 05/26/09 °please schedule at anytime with [facility] Nurse Health Advisor 1 or 2 ° ° °This should be a 45 minute visit.  °

## 2021-06-25 DIAGNOSIS — Z20822 Contact with and (suspected) exposure to covid-19: Secondary | ICD-10-CM | POA: Diagnosis not present

## 2021-07-12 ENCOUNTER — Other Ambulatory Visit: Payer: Self-pay | Admitting: Family Medicine

## 2021-07-26 DIAGNOSIS — E119 Type 2 diabetes mellitus without complications: Secondary | ICD-10-CM | POA: Diagnosis not present

## 2021-07-26 DIAGNOSIS — B351 Tinea unguium: Secondary | ICD-10-CM | POA: Diagnosis not present

## 2021-07-26 DIAGNOSIS — M2011 Hallux valgus (acquired), right foot: Secondary | ICD-10-CM | POA: Diagnosis not present

## 2021-07-26 DIAGNOSIS — M79674 Pain in right toe(s): Secondary | ICD-10-CM | POA: Diagnosis not present

## 2021-07-26 DIAGNOSIS — M2022 Hallux rigidus, left foot: Secondary | ICD-10-CM | POA: Diagnosis not present

## 2021-08-02 DIAGNOSIS — Z0100 Encounter for examination of eyes and vision without abnormal findings: Secondary | ICD-10-CM | POA: Diagnosis not present

## 2021-08-09 ENCOUNTER — Other Ambulatory Visit: Payer: Self-pay | Admitting: Family Medicine

## 2021-08-09 NOTE — Telephone Encounter (Signed)
Last OV- 06/03/2021 ?Last refill- 01/18/2021-60 tabs, 5 refills ? ?No future OV scheduled. ?

## 2021-09-06 ENCOUNTER — Other Ambulatory Visit: Payer: Self-pay | Admitting: Family Medicine

## 2021-09-06 DIAGNOSIS — I10 Essential (primary) hypertension: Secondary | ICD-10-CM

## 2021-09-06 DIAGNOSIS — E119 Type 2 diabetes mellitus without complications: Secondary | ICD-10-CM

## 2021-09-23 DIAGNOSIS — Z20822 Contact with and (suspected) exposure to covid-19: Secondary | ICD-10-CM | POA: Diagnosis not present

## 2021-10-22 DIAGNOSIS — M170 Bilateral primary osteoarthritis of knee: Secondary | ICD-10-CM | POA: Diagnosis not present

## 2021-11-01 ENCOUNTER — Other Ambulatory Visit: Payer: Self-pay | Admitting: Family Medicine

## 2021-11-01 DIAGNOSIS — I10 Essential (primary) hypertension: Secondary | ICD-10-CM

## 2021-11-01 DIAGNOSIS — E119 Type 2 diabetes mellitus without complications: Secondary | ICD-10-CM

## 2021-11-14 ENCOUNTER — Ambulatory Visit: Payer: Self-pay

## 2021-11-14 DIAGNOSIS — I10 Essential (primary) hypertension: Secondary | ICD-10-CM

## 2021-11-14 NOTE — Patient Instructions (Signed)
Visit Information Case closed unable to maintain contact Thank you for allowing me to share the care management and care coordination services that are available to you as part of your health plan and services through your primary care provider and medical home. Please reach out to me at [phone] if the care management/care coordination team may be of assistance to you in the future.   Riyansh Gerstner RN, BSN,CCM, CDE Care Management Coordinator [facility] [phone]   

## 2021-11-14 NOTE — Chronic Care Management (AMB) (Signed)
  Care Management   Follow Up Note   11/14/2021 Name: Amy [name] MRN: [id number] DOB: [birth year]   Referred by: Laurey Morale, MD Reason for referral : Chronic Care Management (Case closed unable to maintain contact)   Case closed unable to maintain contact  Follow Up Plan:  Case closed unable to maintain contact  [place], Jackquline [place], CDE Care Management Coordinator [facility] [phone]

## 2021-11-28 ENCOUNTER — Other Ambulatory Visit: Payer: Self-pay | Admitting: Family Medicine

## 2021-11-28 DIAGNOSIS — E039 Hypothyroidism, unspecified: Secondary | ICD-10-CM

## 2021-12-06 ENCOUNTER — Ambulatory Visit: Payer: Medicare HMO | Admitting: Family Medicine

## 2021-12-09 ENCOUNTER — Encounter: Payer: Self-pay | Admitting: Family Medicine

## 2021-12-09 ENCOUNTER — Ambulatory Visit (INDEPENDENT_AMBULATORY_CARE_PROVIDER_SITE_OTHER): Payer: Medicare HMO | Admitting: Family Medicine

## 2021-12-09 VITALS — BP 120/76 | HR 93 | Temp 98.7°F

## 2021-12-09 DIAGNOSIS — N390 Urinary tract infection, site not specified: Secondary | ICD-10-CM

## 2021-12-09 LAB — POC URINALSYSI DIPSTICK (AUTOMATED)
Bilirubin, UA: NEGATIVE
Blood, UA: NEGATIVE
Glucose, UA: NEGATIVE
Ketones, UA: NEGATIVE
Leukocytes, UA: NEGATIVE
Nitrite, UA: NEGATIVE
Protein, UA: POSITIVE — AB
Spec Grav, UA: 1.025 (ref 1.010–1.025)
Urobilinogen, UA: 0.2 E.U./dL
pH, UA: 6 (ref 5.0–8.0)

## 2021-12-09 MED ORDER — CIPROFLOXACIN HCL 500 MG PO TABS: 500.0000 mg | ORAL_TABLET | Freq: Two times a day (BID) | ORAL | 0 refills | Status: DC

## 2021-12-09 NOTE — Addendum Note (Signed)
Addended by: Wyvonne Lenz on: 12/09/2021 04:45 PM   Modules accepted: Orders

## 2021-12-09 NOTE — Progress Notes (Signed)
   Subjective:    Patient ID: Amy [name], female    DOB: 1930-03-07, 88 y.o.   MRN: [id number]  HPI Here for 2 weeks of low abdominal pains, low back pains, nausea without vomiting, some diarrhea, and pressure to urinate. No fever. Drinking fluids.   Review of Systems  Constitutional: Negative.   Respiratory: Negative.    Cardiovascular: Negative.   Gastrointestinal:  Positive for abdominal pain, diarrhea and nausea. Negative for abdominal distention, blood in stool, constipation and vomiting.  Genitourinary:  Positive for frequency and urgency. Negative for dysuria, flank pain and hematuria.       Objective:   Physical Exam Constitutional:      Appearance: Normal appearance. She is not ill-appearing.  Cardiovascular:     Rate and Rhythm: Normal rate and regular rhythm.     Pulses: Normal pulses.     Heart sounds: Normal heart sounds.  Pulmonary:     Effort: Pulmonary effort is normal.     Breath sounds: Normal breath sounds.  Abdominal:     General: Abdomen is flat. Bowel sounds are normal. There is no distension.     Palpations: Abdomen is soft. There is no mass.     Tenderness: There is no right CVA tenderness, left CVA tenderness, guarding or rebound.     Hernia: No hernia is present.     Comments: She is moderately tender above the pubis   Neurological:     Mental Status: She is alert.           Assessment & Plan:  UTI, treat with 7 days of Cipro. Culture the sample.  [name], MD

## 2021-12-10 DIAGNOSIS — N390 Urinary tract infection, site not specified: Secondary | ICD-10-CM | POA: Diagnosis not present

## 2021-12-11 LAB — URINE CULTURE
MICRO NUMBER:: 13661469
SPECIMEN QUALITY:: ADEQUATE

## 2021-12-23 ENCOUNTER — Telehealth: Payer: Self-pay | Admitting: Family Medicine

## 2021-12-23 NOTE — Telephone Encounter (Signed)
Left message for patient to call back and schedule Medicare Annual Wellness Visit (AWV) either virtually or in office. Left  my jabber number [phone] ° ° °AWV-I per [place] 05/26/09 °please schedule at anytime with [facility] Nurse Health Advisor 1 or 2 ° ° °This should be a 45 minute visit.  °

## 2021-12-24 ENCOUNTER — Other Ambulatory Visit: Payer: Self-pay

## 2021-12-24 ENCOUNTER — Other Ambulatory Visit: Payer: Self-pay | Admitting: Family Medicine

## 2021-12-24 DIAGNOSIS — I10 Essential (primary) hypertension: Secondary | ICD-10-CM

## 2021-12-24 DIAGNOSIS — E119 Type 2 diabetes mellitus without complications: Secondary | ICD-10-CM

## 2021-12-24 MED ORDER — GLIPIZIDE 5 MG PO TABS: ORAL_TABLET | ORAL | 1 refills | Status: DC

## 2021-12-24 MED ORDER — DONEPEZIL HCL 5 MG PO TABS: 5.0000 mg | ORAL_TABLET | Freq: Every day | ORAL | 1 refills | Status: DC

## 2021-12-24 MED ORDER — GABAPENTIN 300 MG PO CAPS: 300.0000 mg | ORAL_CAPSULE | Freq: Two times a day (BID) | ORAL | 1 refills | Status: DC

## 2021-12-24 MED ORDER — SERTRALINE HCL 50 MG PO TABS: 50.0000 mg | ORAL_TABLET | Freq: Every day | ORAL | 1 refills | Status: DC

## 2021-12-24 MED ORDER — SITAGLIPTIN PHOSPHATE 100 MG PO TABS: 100.0000 mg | ORAL_TABLET | Freq: Every day | ORAL | 1 refills | Status: DC

## 2021-12-24 MED ORDER — AMLODIPINE BESYLATE 5 MG PO TABS: 5.0000 mg | ORAL_TABLET | Freq: Two times a day (BID) | ORAL | 1 refills | Status: DC

## 2021-12-24 NOTE — Telephone Encounter (Signed)
Last OV- 12/09/21 Last refill- 12/09/21  No future OV scheduled.

## 2021-12-27 ENCOUNTER — Telehealth: Payer: Self-pay | Admitting: Family Medicine

## 2021-12-27 NOTE — Telephone Encounter (Signed)
Daughter calling states a week ago patient suffered a fall, proceeded by patient saying she felt unsteady/dizzy. Fell straight back, did not brace her fall. Fell onto coffee table, bruised and sore. Monday and Tuesday patient woke up thinking it was night time but it was actually daytime, disoriented when caregiver came by. Caregiver came by Tuesday and patient was disoriented, feels this is a major change for her mother, even thought she is approaching 88 years old. Seeking guidance as to whether you would like to see her to evaluate. Pls contact daughter.

## 2021-12-30 ENCOUNTER — Other Ambulatory Visit: Payer: Self-pay | Admitting: Family Medicine

## 2021-12-30 ENCOUNTER — Ambulatory Visit (INDEPENDENT_AMBULATORY_CARE_PROVIDER_SITE_OTHER): Payer: Medicare HMO

## 2021-12-30 VITALS — Ht 65.0 in

## 2021-12-30 DIAGNOSIS — Z0000 Encounter for general adult medical examination without abnormal findings: Secondary | ICD-10-CM

## 2021-12-30 NOTE — Patient Instructions (Signed)
Amy [name] , Thank you for taking time to come for your Medicare Wellness Visit. I appreciate your ongoing commitment to your health goals. Please review the following plan we discussed and let me know if I can assist you in the future.   Screening recommendations/referrals: Colonoscopy: not required Mammogram: not required Bone Density: due Recommended yearly ophthalmology/optometry visit for glaucoma screening and checkup Recommended yearly dental visit for hygiene and checkup  Vaccinations: Influenza vaccine: due Pneumococcal vaccine: completed 05/16/2016 Tdap vaccine: due Shingles vaccine: discussed   Covid-19: 03/22/2020, 07/11/2019, 06/13/2019  Advanced directives: Please bring a copy of your POA (Power of Attorney) and/or Living Will to your next appointment.   Conditions/risks identified: none  Next appointment: Follow up in one year for your annual wellness visit    Preventive Care 86 Years and Older, Female Preventive care refers to lifestyle choices and visits with your health care provider that can promote health and wellness. What does preventive care include? A yearly physical exam. This is also called an annual well check. Dental exams once or twice a year. Routine eye exams. Ask your health care provider how often you should have your eyes checked. Personal lifestyle choices, including: Daily care of your teeth and gums. Regular physical activity. Eating a healthy diet. Avoiding tobacco and drug use. Limiting alcohol use. Practicing safe sex. Taking low-dose aspirin every day. Taking vitamin and mineral supplements as recommended by your health care provider. What happens during an annual well check? The services and screenings done by your health care provider during your annual well check will depend on your age, overall health, lifestyle risk factors, and family history of disease. Counseling  Your health care provider may ask you questions about  your: Alcohol use. Tobacco use. Drug use. Emotional well-being. Home and relationship well-being. Sexual activity. Eating habits. History of falls. Memory and ability to understand (cognition). Work and work Statistician. Reproductive health. Screening  You may have the following tests or measurements: Height, weight, and BMI. Blood pressure. Lipid and cholesterol levels. These may be checked every 5 years, or more frequently if you are over 32 years old. Skin check. Lung cancer screening. You may have this screening every year starting at age 86 if you have a 30-pack-year history of smoking and currently smoke or have quit within the past 15 years. Fecal occult blood test (FOBT) of the stool. You may have this test every year starting at age 86. Flexible sigmoidoscopy or colonoscopy. You may have a sigmoidoscopy every 5 years or a colonoscopy every 10 years starting at age 86. Hepatitis C blood test. Hepatitis B blood test. Sexually transmitted disease (STD) testing. Diabetes screening. This is done by checking your blood sugar (glucose) after you have not eaten for a while (fasting). You may have this done every 1-3 years. Bone density scan. This is done to screen for osteoporosis. You may have this done starting at age 86. Mammogram. This may be done every 1-2 years. Talk to your health care provider about how often you should have regular mammograms. Talk with your health care provider about your test results, treatment options, and if necessary, the need for more tests. Vaccines  Your health care provider may recommend certain vaccines, such as: Influenza vaccine. This is recommended every year. Tetanus, diphtheria, and acellular pertussis (Tdap, Td) vaccine. You may need a Td booster every 10 years. Zoster vaccine. You may need this after age 61. Pneumococcal 13-valent conjugate (PCV13) vaccine. One dose is recommended after age 86.  Pneumococcal polysaccharide (PPSV23) vaccine.  One dose is recommended after age 86. Talk to your health care provider about which screenings and vaccines you need and how often you need them. This information is not intended to replace advice given to you by your health care provider. Make sure you discuss any questions you have with your health care provider. Document Released: 06/08/2015 Document Revised: 01/30/2016 Document Reviewed: 03/13/2015 Elsevier Interactive Patient Education  2017 [employer] Prevention in the Home Falls can cause injuries. They can happen to people of all ages. There are many things you can do to make your home safe and to help prevent falls. What can I do on the outside of my home? Regularly fix the edges of walkways and driveways and fix any cracks. Remove anything that might make you trip as you walk through a door, such as a raised step or threshold. Trim any bushes or trees on the path to your home. Use bright outdoor lighting. Clear any walking paths of anything that might make someone trip, such as rocks or tools. Regularly check to see if handrails are loose or broken. Make sure that both sides of any steps have handrails. Any raised decks and porches should have guardrails on the edges. Have any leaves, snow, or ice cleared regularly. Use sand or salt on walking paths during winter. Clean up any spills in your garage right away. This includes oil or grease spills. What can I do in the bathroom? Use night lights. Install grab bars by the toilet and in the tub and shower. Do not use towel bars as grab bars. Use non-skid mats or decals in the tub or shower. If you need to sit down in the shower, use a plastic, non-slip stool. Keep the floor dry. Clean up any water that spills on the floor as soon as it happens. Remove soap buildup in the tub or shower regularly. Attach bath mats securely with double-sided non-slip rug tape. Do not have throw rugs and other things on the floor that can make  you trip. What can I do in the bedroom? Use night lights. Make sure that you have a light by your bed that is easy to reach. Do not use any sheets or blankets that are too big for your bed. They should not hang down onto the floor. Have a firm chair that has side arms. You can use this for support while you get dressed. Do not have throw rugs and other things on the floor that can make you trip. What can I do in the kitchen? Clean up any spills right away. Avoid walking on wet floors. Keep items that you use a lot in easy-to-reach places. If you need to reach something above you, use a strong step stool that has a grab bar. Keep electrical cords out of the way. Do not use floor polish or wax that makes floors slippery. If you must use wax, use non-skid floor wax. Do not have throw rugs and other things on the floor that can make you trip. What can I do with my stairs? Do not leave any items on the stairs. Make sure that there are handrails on both sides of the stairs and use them. Fix handrails that are broken or loose. Make sure that handrails are as long as the stairways. Check any carpeting to make sure that it is firmly attached to the stairs. Fix any carpet that is loose or worn. Avoid having throw rugs at the  top or bottom of the stairs. If you do have throw rugs, attach them to the floor with carpet tape. Make sure that you have a light switch at the top of the stairs and the bottom of the stairs. If you do not have them, ask someone to add them for you. What else can I do to help prevent falls? Wear shoes that: Do not have high heels. Have rubber bottoms. Are comfortable and fit you well. Are closed at the toe. Do not wear sandals. If you use a stepladder: Make sure that it is fully opened. Do not climb a closed stepladder. Make sure that both sides of the stepladder are locked into place. Ask someone to hold it for you, if possible. Clearly mark and make sure that you can  see: Any grab bars or handrails. First and last steps. Where the edge of each step is. Use tools that help you move around (mobility aids) if they are needed. These include: Canes. Walkers. Scooters. Crutches. Turn on the lights when you go into a dark area. Replace any light bulbs as soon as they burn out. Set up your furniture so you have a clear path. Avoid moving your furniture around. If any of your floors are uneven, fix them. If there are any pets around you, be aware of where they are. Review your medicines with your doctor. Some medicines can make you feel dizzy. This can increase your chance of falling. Ask your doctor what other things that you can do to help prevent falls. This information is not intended to replace advice given to you by your health care provider. Make sure you discuss any questions you have with your health care provider. Document Released: 03/08/2009 Document Revised: 10/18/2015 Document Reviewed: 06/16/2014 Elsevier Interactive Patient Education  2017 [employer].

## 2021-12-30 NOTE — Progress Notes (Signed)
I connected with Amy [name] today by telephone and verified that I am speaking with the correct person using two identifiers. Location patient: home Location provider: work Persons participating in the virtual visit: Amy [name], [name].   I discussed the limitations, risks, security and privacy concerns of performing an evaluation and management service by telephone and the availability of in person appointments. I also discussed with the patient that there may be a patient responsible charge related to this service. The patient expressed understanding and verbally consented to this telephonic visit.    Interactive audio and video telecommunications were attempted between this provider and patient, however failed, due to patient having technical difficulties OR patient did not have access to video capability.  We continued and completed visit with audio only.     Vital signs may be patient reported or missing.  Subjective:   Amy [name] is a 88 y.o. female who presents for an Initial Medicare Annual Wellness Visit.  Review of Systems     Cardiac Risk Factors include: advanced age (>17mn, >>6women);diabetes mellitus;hypertension     Objective:    Today's Vitals   12/30/21 1426 12/30/21 1427  Height: 5' 5"  (1.651 m)   PainSc:  8    Body mass index is 31.62 kg/m.     12/30/2021    2:36 PM 04/04/2021   11:24 AM 04/28/2018    6:37 PM 04/25/2016    5:52 PM 04/22/2016   11:50 PM 04/22/2016    6:15 PM 08/30/2015    9:25 AM  Advanced Directives  Does Patient Have a Medical Advance Directive? Yes Yes No Yes Yes No Yes  Type of [place]  [place]   Does patient want to make changes to medical advance directive?  No - Patient declined  No - Patient declined No - Patient declined    Copy of [place] Chart? No - copy requested No - copy requested  No - copy requested No - copy requested  No - copy requested  Would patient like information on creating a medical advance directive?   No - Patient declined  No - Patient declined No - Patient declined     Current Medications (verified) Outpatient Encounter Medications as of 12/30/2021  Medication Sig   amLODipine (NORVASC) 5 MG tablet Take 1 tablet (5 mg total) by mouth 2 (two) times daily.   aspirin 81 MG tablet Take 1 tablet (81 mg total) by mouth daily.   atorvastatin (LIPITOR) 40 MG tablet TAKE 1 TABLET ONCE DAILY.   blood glucose meter kit and supplies Dispense based on patient and insurance preference. Use up to four times daily as directed. (FOR ICD-10 E10.9, E11.9).   ciprofloxacin (CIPRO) 500 MG tablet Take 1 tablet (500 mg total) by mouth 2 (two) times daily.   docusate sodium (COLACE) 100 MG capsule Take 1 capsule (100 mg total) by mouth 2 (two) times daily.   donepezil (ARICEPT) 5 MG tablet Take 1 tablet (5 mg total) by mouth at bedtime.   famotidine (PEPCID) 40 MG tablet Take 1 tablet (40 mg total) by mouth at bedtime.   gabapentin (NEURONTIN) 300 MG capsule Take 1 capsule (300 mg total) by mouth 2 (two) times daily.   glipiZIDE (GLUCOTROL) 5 MG tablet TAKE ONE TABLET BY MOUTH TWICE DAILY BEFORE MEALS   ibuprofen (ADVIL,MOTRIN) 200 MG tablet  Take 400 mg by mouth at bedtime as needed (RLS).   levothyroxine (SYNTHROID) 175 MCG tablet TAKE ONE TABLET BY MOUTH EVERY MORNING BEFORE BREAKFAST.   meclizine (ANTIVERT) 25 MG tablet Take 1 tablet (25 mg total) by mouth 3 (three) times daily as needed for dizziness.   metFORMIN (GLUCOPHAGE) 500 MG tablet TAKE 1 TABLET BY MOUTH TWICE DAILY.   Multiple Vitamin (MULTIVITAMIN WITH MINERALS) TABS tablet Take 1 tablet by mouth daily.   omeprazole (PRILOSEC) 40 MG capsule Take 1 capsule (40 mg total) by mouth in the morning.   oxybutynin (DITROPAN-XL) 5 MG 24 hr tablet Take 1 tablet (5 mg  total) by mouth at bedtime.   sertraline (ZOLOFT) 50 MG tablet Take 1 tablet (50 mg total) by mouth daily.   sitaGLIPtin (JANUVIA) 100 MG tablet Take 1 tablet (100 mg total) by mouth daily.   traMADol (ULTRAM) 50 MG tablet TAKE 2 TABLETS EVERY 6 HOURS AS NEEDED FOR MODERATE PAIN.   No facility-administered encounter medications on file as of 12/30/2021.    Allergies (verified) Ativan [lorazepam], Diclofenac sodium, Feldene [piroxicam], Latex, and Tape   History: Past Medical History:  Diagnosis Date   ABSCESS 12/18/2009   DIABETES MELLITUS, TYPE II 05/22/2009   Diverticulosis    Hemorrhoids    HYPERGLYCEMIA 05/22/2009   HYPERTENSION 04/13/2007   HYPOTHYROIDISM 04/13/2007   NEOPLASM, SKIN, UNCERTAIN BEHAVIOR 9/56/3875   Stroke ([place])    Tubular adenoma of colon 02/2001   Past Surgical History:  Procedure Laterality Date   ABDOMINAL HYSTERECTOMY     APPENDECTOMY     CHOLECYSTECTOMY     COLONOSCOPY  04-05-04   per Dr. Deatra Ina, no polyps    OOPHORECTOMY     THYROIDECTOMY     secondary to nodule, no cancer   TONSILLECTOMY AND ADENOIDECTOMY     Family History  Problem Relation Age of Onset   Stroke Father    Hypertension Mother    Stroke Mother    Huntington's disease Daughter 67   Colon cancer Neg Hx    Social History   Socioeconomic History   Marital status: Widowed    Spouse name: Not on file   Number of children: 3   Years of education: Not on file   Highest education level: Not on file  Occupational History   Occupation: Retired  Tobacco Use   Smoking status: Never   Smokeless tobacco: Never  Vaping Use   Vaping Use: Never used  Substance and Sexual Activity   Alcohol use: No    Alcohol/week: 0.0 standard drinks of alcohol   Drug use: No   Sexual activity: Not on file  Other Topics Concern   Not on file  Social History Narrative   Daily caffeine    Social Determinants of Health   Financial Resource Strain: Low Risk  (12/30/2021)   Overall Financial  Resource Strain (CARDIA)    Difficulty of Paying Living Expenses: Not hard at all  Food Insecurity: No Food Insecurity (12/30/2021)   Hunger Vital Sign    Worried About Running Out of Food in the Last Year: Never true    [place] in the Last Year: Never true  Transportation Needs: No Transportation Needs (12/30/2021)   PRAPARE - Hydrologist (Medical): No    Lack of Transportation (Non-Medical): No  Physical Activity: Inactive (12/30/2021)   Exercise Vital Sign    Days of Exercise per Week: 0 days    Minutes of Exercise  per Session: 0 min  Stress: No Stress Concern Present (12/30/2021)   [place]    Feeling of Stress : Not at all  Social Connections: Moderately Integrated (04/04/2021)   Social Connection and Isolation Panel [NHANES]    Frequency of Communication with Friends and Family: More than three times a week    Frequency of Social Gatherings with Friends and Family: More than three times a week    Attends Religious Services: More than 4 times per year    Active Member of [employer] or Organizations: Yes    Attends Archivist Meetings: More than 4 times per year    Marital Status: Widowed    Tobacco Counseling Counseling given: Not Answered   Clinical Intake:  Pre-visit preparation completed: Yes  Pain : 0-10 Pain Score: 8  Pain Type: Acute pain Pain Location: Rib cage Pain Orientation: Right Pain Descriptors / Indicators: Discomfort Pain Onset: 1 to 4 weeks ago Pain Frequency: Constant     Nutritional Risks: None Diabetes: Yes  How often do you need to have someone help you when you read instructions, pamphlets, or other written materials from your doctor or pharmacy?: 1 - Never  Diabetic? Yes Nutrition Risk Assessment:  Has the patient had any N/V/D within the last 2 months?  No  Does the patient have any non-healing wounds?  No  Has the patient had any  unintentional weight loss or weight gain?  No   Diabetes:  Is the patient diabetic?  Yes  If diabetic, was a CBG obtained today?  No  Did the patient bring in their glucometer from home?  No  How often do you monitor your CBG's? When she thinks about it.   Financial Strains and Diabetes Management:  Are you having any financial strains with the device, your supplies or your medication? No .  Does the patient want to be seen by Chronic Care Management for management of their diabetes?  No  Would the patient like to be referred to a Nutritionist or for Diabetic Management?  No   Diabetic Exams:  Diabetic Eye Exam: Overdue for diabetic eye exam. Pt has been advised about the importance in completing this exam. Patient advised to call and schedule an eye exam. Diabetic Foot Exam: Overdue, Pt has been advised about the importance in completing this exam. Pt is scheduled for diabetic foot exam on next appointment.   Interpreter Needed?: No  Information entered by :: NAllen LPN   Activities of Daily Living    12/30/2021    2:41 PM 06/03/2021   11:55 AM  In your present state of health, do you have any difficulty performing the following activities:  Hearing? 0 0  Vision? 0 0  Difficulty concentrating or making decisions? 0 0  Walking or climbing stairs? 0 1  Comment  back pains  Dressing or bathing? 0 0  Doing errands, shopping? 0 0  Preparing Food and eating ? N   Using the Toilet? N   In the past six months, have you accidently leaked urine? Y   Do you have problems with loss of bowel control? N   Managing your Medications? N   Comment pre packaged   Managing your Finances? N   Housekeeping or managing your Housekeeping? N     Patient Care Team: Laurey Morale, MD as PCP - General (Family Medicine)  Indicate any recent Medical Services you may have received from other than [facility]  providers in the past year (date may be approximate).     Assessment:   This is a routine  wellness examination for [place].  Hearing/Vision screen Vision Screening - Comments:: Regular eye exams, [employer]  Dietary issues and exercise activities discussed: Current Exercise Habits: The patient does not participate in regular exercise at present   Goals Addressed             This Visit's Progress    Patient Stated       12/30/2021, wants to lose weight       Depression Screen    12/30/2021    2:37 PM 12/09/2021    2:23 PM 06/03/2021   11:54 AM 04/04/2021   11:23 AM 04/09/2020   10:47 AM 09/06/2014    8:30 AM 07/11/2013   10:19 AM  PHQ 2/9 Scores  PHQ - 2 Score 0 0 0 0 0 0 0  PHQ- 9 Score  0 1        Fall Risk    12/30/2021    2:37 PM 12/09/2021    2:23 PM 06/03/2021   11:54 AM 04/04/2021   11:22 AM 12/28/2020    9:27 AM  Fall Risk   Falls in the past year? 1 0 1 1 1   Comment not sure what happened      Number falls in past yr: 0 0 1 0 0  Injury with Fall? 0 0 0 0 1  Comment     knee pain/swelling  Risk for fall due to : Impaired balance/gait;Impaired mobility;Medication side effect History of fall(s)  History of fall(s);Impaired mobility Impaired balance/gait  Follow up Falls evaluation completed;Education provided;Falls prevention discussed Falls evaluation completed Falls evaluation completed Education provided;Falls prevention discussed Follow up appointment    FALL RISK PREVENTION PERTAINING TO THE HOME:  Any stairs in or around the home? No  If so, are there any without handrails? N/a Home free of loose throw rugs in walkways, pet beds, electrical cords, etc? Yes  Adequate lighting in your home to reduce risk of falls? Yes   ASSISTIVE DEVICES UTILIZED TO PREVENT FALLS:  Life alert? Yes  Use of a cane, walker or w/c? Yes  Grab bars in the bathroom? Yes  Shower chair or bench in shower? Yes  Elevated toilet seat or a handicapped toilet? Yes   TIMED UP AND GO:  Was the test performed? No .       Cognitive Function:        12/30/2021     2:44 PM  6CIT Screen  What Year? 4 points  What month? 0 points  What time? 0 points  Count back from 20 0 points  Months in reverse 0 points  Repeat phrase 2 points  Total Score 6 points    Immunizations Immunization History  Administered Date(s) Administered   Fluad Quad(high Dose 65+) 03/17/2019, 02/07/2020   Influenza Split 03/24/2011, 02/17/2012   Influenza Whole 03/17/2007, 02/10/2008, 04/04/2009, 04/09/2010   Influenza, High Dose Seasonal PF 04/01/2013, 03/12/2015, 03/12/2016, 03/10/2018   Influenza-Unspecified 04/04/2014   Moderna Sars-Covid-2 Vaccination 06/13/2019, 07/11/2019, 03/22/2020   Pneumococcal Conjugate-13 05/16/2016   Pneumococcal Polysaccharide-23 06/26/1998   Zoster, Live 12/03/2011    TDAP status: Due, Education has been provided regarding the importance of this vaccine. Advised may receive this vaccine at local pharmacy or Health Dept. Aware to provide a copy of the vaccination record if obtained from local pharmacy or Health Dept. Verbalized acceptance and understanding.  Flu Vaccine status: Due, Education  has been provided regarding the importance of this vaccine. Advised may receive this vaccine at local pharmacy or Health Dept. Aware to provide a copy of the vaccination record if obtained from local pharmacy or Health Dept. Verbalized acceptance and understanding.  Pneumococcal vaccine status: Up to date  Covid-19 vaccine status: Completed vaccines  Qualifies for Shingles Vaccine? Yes   Zostavax completed Yes   Shingrix Completed?: No.    Education has been provided regarding the importance of this vaccine. Patient has been advised to call insurance company to determine out of pocket expense if they have not yet received this vaccine. Advised may also receive vaccine at local pharmacy or Health Dept. Verbalized acceptance and understanding.  Screening Tests Health Maintenance  Topic Date Due   TETANUS/TDAP  Never done   Zoster Vaccines- Shingrix (1  of 2) Never done   DEXA SCAN  Never done   FOOT EXAM  04/25/2014   URINE MICROALBUMIN  04/25/2014   COVID-19 Vaccine (4 - Booster for Moderna series) 05/17/2020   OPHTHALMOLOGY EXAM  11/16/2021   HEMOGLOBIN A1C  12/01/2021   INFLUENZA VACCINE  12/24/2021   Pneumonia Vaccine 74+ Years old  Completed   HPV VACCINES  Aged Out    Health Maintenance  Health Maintenance Due  Topic Date Due   TETANUS/TDAP  Never done   Zoster Vaccines- Shingrix (1 of 2) Never done   DEXA SCAN  Never done   FOOT EXAM  04/25/2014   URINE MICROALBUMIN  04/25/2014   COVID-19 Vaccine (4 - Booster for Moderna series) 05/17/2020   OPHTHALMOLOGY EXAM  11/16/2021   HEMOGLOBIN A1C  12/01/2021   INFLUENZA VACCINE  12/24/2021    Colorectal cancer screening: No longer required.   Mammogram status: No longer required due to age.  Bone Density status: due  Lung Cancer Screening: (Low Dose CT Chest recommended if Age 79-80 years, 30 pack-year currently smoking OR have quit w/in 15years.) does not qualify.   Lung Cancer Screening Referral: no  Additional Screening:  Hepatitis C Screening: does not qualify;   Vision Screening: Recommended annual ophthalmology exams for early detection of glaucoma and other disorders of the eye. Is the patient up to date with their annual eye exam?  No  Who is the provider or what is the name of the office in which the patient attends annual eye exams? [place] If pt is not established with a provider, would they like to be referred to a provider to establish care? No .   Dental Screening: Recommended annual dental exams for proper oral hygiene  Community Resource Referral / Chronic Care Management: CRR required this visit?  No   CCM required this visit?  No      Plan:     I have personally reviewed and noted the following in the patient's chart:   Medical and social history Use of alcohol, tobacco or illicit drugs  Current medications and supplements  including opioid prescriptions. Patient is not currently taking opioid prescriptions. Functional ability and status Nutritional status Physical activity Advanced directives List of other physicians Hospitalizations, surgeries, and ER visits in previous 12 months Vitals Screenings to include cognitive, depression, and falls Referrals and appointments  In addition, I have reviewed and discussed with patient certain preventive protocols, quality metrics, and best practice recommendations. A written personalized care plan for preventive services as well as general preventive health recommendations were provided to patient.     Kellie Simmering, LPN   3/0/0762  Nurse Notes: none  Due to this being a virtual visit, the after visit summary with patients personalized plan was offered to patient via mail or my-chart.  to pick up at office at next visit

## 2021-12-30 NOTE — Telephone Encounter (Signed)
Set up a 30 minute in person OV for me to see her, and hopefully the daughter can come as well

## 2021-12-31 NOTE — Telephone Encounter (Signed)
Spoke with pt daughter advised that Dr Sarajane Jews recommends for pt to schedule OV and for pt daughter to accompany pt for the visit, pt daughter state that she will talk to pt and will then call the office to schedule app

## 2022-01-06 ENCOUNTER — Ambulatory Visit (INDEPENDENT_AMBULATORY_CARE_PROVIDER_SITE_OTHER): Payer: Medicare HMO

## 2022-01-06 ENCOUNTER — Encounter: Payer: Self-pay | Admitting: Family Medicine

## 2022-01-06 ENCOUNTER — Ambulatory Visit (INDEPENDENT_AMBULATORY_CARE_PROVIDER_SITE_OTHER): Payer: Medicare HMO | Admitting: Family Medicine

## 2022-01-06 VITALS — BP 130/84 | HR 70 | Temp 98.4°F

## 2022-01-06 DIAGNOSIS — N390 Urinary tract infection, site not specified: Secondary | ICD-10-CM

## 2022-01-06 DIAGNOSIS — I517 Cardiomegaly: Secondary | ICD-10-CM | POA: Diagnosis not present

## 2022-01-06 DIAGNOSIS — S20211A Contusion of right front wall of thorax, initial encounter: Secondary | ICD-10-CM | POA: Diagnosis not present

## 2022-01-06 DIAGNOSIS — R0781 Pleurodynia: Secondary | ICD-10-CM | POA: Diagnosis not present

## 2022-01-06 DIAGNOSIS — R410 Disorientation, unspecified: Secondary | ICD-10-CM

## 2022-01-06 MED ORDER — NITROFURANTOIN MONOHYD MACRO 100 MG PO CAPS: 100.0000 mg | ORAL_CAPSULE | Freq: Two times a day (BID) | ORAL | 0 refills | Status: DC

## 2022-01-06 NOTE — Progress Notes (Signed)
   Subjective:    Patient ID: Amy [name], female    DOB: 1929/08/28, 88 y.o.   MRN: [id number]  HPI Here for several issues. First we saw her on 12-09-21 for symptoms that suggested a UTI, and she was given 7 days of Cipro. The urine culture was negative. She felt better after that, but now she has the same symptoms again for several days. These include pressure to urinate and pelvic pain which is relieved when she urinates. No fever or nausea. She drinks plenty of water. Also 2 weeks ago she says she suddenly felt weak while in her apartment, and she fell backwards across a table. There was no LOC, and she remembers the entire incident. Since the fall she has had pain in the right ribs. She denies SOB, but taking deep breaths is painful. No falls since then. Also for the past 2 days she has felt a little confused and has lost her sense of time. She has been confused about whether it is daytime or night time. Today she feels better as far as these symptoms go. She also admits that the urinary pain and pressure has returned the ;ast 3 days. She is unable to give [place] a urine sample today.    Review of Systems  Constitutional:  Positive for fatigue. Negative for fever.  Respiratory: Negative.    Cardiovascular:  Positive for chest pain. Negative for palpitations and leg swelling.  Gastrointestinal: Negative.   Genitourinary:  Positive for dysuria, pelvic pain and urgency. Negative for frequency and hematuria.  Psychiatric/Behavioral:  Positive for confusion. Negative for agitation, dysphoric mood and hallucinations. The patient is not nervous/anxious.        Objective:   Physical Exam Constitutional:      General: She is not in acute distress.    Appearance: Normal appearance.  Cardiovascular:     Rate and Rhythm: Normal rate and regular rhythm.     Pulses: Normal pulses.     Heart sounds: Normal heart sounds.  Pulmonary:     Effort: Pulmonary effort is normal.     Breath sounds: Normal  breath sounds.     Comments: She is tender over the right lateral lower ribs. No crepitus  Abdominal:     General: Abdomen is flat. Bowel sounds are normal. There is no distension.     Palpations: Abdomen is soft. There is no mass.     Tenderness: There is no abdominal tenderness. There is no right CVA tenderness, left CVA tenderness, guarding or rebound.     Hernia: No hernia is present.  Neurological:     General: No focal deficit present.     Mental Status: She is alert and oriented to person, place, and time. Mental status is at baseline.           Assessment & Plan:  First she seems to have a partially treated UTI. We will give her 7 days of Macrobid. This UTI has likely contributed to her fall and to her recent confusion. We will get Xrays of the right ribs today to look for fractures. Hopefully after treatment her confusion will rsolve. We will check her again in one week. We spent a total of ( 33  ) minutes reviewing records and discussing these issues.  [name], MD

## 2022-01-07 ENCOUNTER — Telehealth: Payer: Self-pay | Admitting: Family Medicine

## 2022-01-07 MED ORDER — NIRMATRELVIR/RITONAVIR (PAXLOVID) TABLET (RENAL DOSING): 2.0000 | ORAL_TABLET | Freq: Two times a day (BID) | ORAL | 0 refills | Status: AC

## 2022-01-07 NOTE — Telephone Encounter (Signed)
Pharmacy updated.

## 2022-01-07 NOTE — Telephone Encounter (Signed)
Done

## 2022-01-07 NOTE — Telephone Encounter (Signed)
Pt was seen yesterday and was dx with covid today. Pt has a little cough and sore throat and would [facility]  [id number]  [phone] Fax:  [phone]

## 2022-01-08 NOTE — Telephone Encounter (Addendum)
Called patient home number [phone]'s unable to lvm due to number busy.   Medication was sent to the pharmacy on 01/07/22.

## 2022-01-13 ENCOUNTER — Other Ambulatory Visit: Payer: Self-pay | Admitting: Family Medicine

## 2022-02-06 DIAGNOSIS — M170 Bilateral primary osteoarthritis of knee: Secondary | ICD-10-CM | POA: Diagnosis not present

## 2022-02-20 ENCOUNTER — Other Ambulatory Visit: Payer: Self-pay | Admitting: Family Medicine

## 2022-02-20 DIAGNOSIS — I10 Essential (primary) hypertension: Secondary | ICD-10-CM

## 2022-02-20 DIAGNOSIS — E119 Type 2 diabetes mellitus without complications: Secondary | ICD-10-CM

## 2022-04-15 DIAGNOSIS — L988 Other specified disorders of the skin and subcutaneous tissue: Secondary | ICD-10-CM | POA: Diagnosis not present

## 2022-04-15 DIAGNOSIS — D1801 Hemangioma of skin and subcutaneous tissue: Secondary | ICD-10-CM | POA: Diagnosis not present

## 2022-04-15 DIAGNOSIS — L570 Actinic keratosis: Secondary | ICD-10-CM | POA: Diagnosis not present

## 2022-05-07 ENCOUNTER — Encounter: Payer: Self-pay | Admitting: Family Medicine

## 2022-05-07 ENCOUNTER — Ambulatory Visit (INDEPENDENT_AMBULATORY_CARE_PROVIDER_SITE_OTHER): Payer: Medicare HMO | Admitting: Family Medicine

## 2022-05-07 VITALS — BP 120/72 | HR 71 | Temp 98.1°F

## 2022-05-07 DIAGNOSIS — M79604 Pain in right leg: Secondary | ICD-10-CM | POA: Diagnosis not present

## 2022-05-07 DIAGNOSIS — M79605 Pain in left leg: Secondary | ICD-10-CM | POA: Diagnosis not present

## 2022-05-07 DIAGNOSIS — R413 Other amnesia: Secondary | ICD-10-CM | POA: Diagnosis not present

## 2022-05-07 MED ORDER — TRAMADOL HCL 50 MG PO TABS: ORAL_TABLET | ORAL | 5 refills | Status: DC

## 2022-05-07 MED ORDER — DONEPEZIL HCL 10 MG PO TABS: 10.0000 mg | ORAL_TABLET | Freq: Every day | ORAL | 3 refills | Status: DC

## 2022-05-07 NOTE — Progress Notes (Signed)
   Subjective:    Patient ID: Amy [name], female    DOB: 1929/06/08, 88 y.o.   MRN: [id number]  HPI Here for several issues. First she complains of a constant aching pain in both legs that started about 2 months ago. No recent trauma. She has had OA in the knees for years, but she says this pain is different. She takes either Tylenol or Tramadol for it. The other issues is her memory. She feels like it continues to slowly decline. She has been on Aricept 5 mg for about a year.    Review of Systems  Constitutional: Negative.   Respiratory: Negative.    Cardiovascular: Negative.   Musculoskeletal:  Positive for arthralgias and myalgias.       Objective:   Physical Exam Constitutional:      Appearance: Normal appearance.     Comments: She has some pain when standing up from the chair   Cardiovascular:     Rate and Rhythm: Normal rate and regular rhythm.     Pulses: Normal pulses.     Heart sounds: Normal heart sounds.  Pulmonary:     Effort: Pulmonary effort is normal.     Breath sounds: Normal breath sounds.  Neurological:     General: No focal deficit present.     Mental Status: She is alert and oriented to person, place, and time.           Assessment & Plan:  Her aching leg pains may be a side effect of the Atorvastatin. I advised her to stop taking this, and we will follow up on this next month at her well exam. For the memory loss, we will increase the Aricept to 10 mg daily.  [name], MD

## 2022-05-08 ENCOUNTER — Ambulatory Visit: Payer: Medicare HMO | Admitting: Family Medicine

## 2022-06-24 ENCOUNTER — Ambulatory Visit (INDEPENDENT_AMBULATORY_CARE_PROVIDER_SITE_OTHER): Payer: Medicare HMO | Admitting: Family Medicine

## 2022-06-24 ENCOUNTER — Encounter: Payer: Self-pay | Admitting: Family Medicine

## 2022-06-24 VITALS — BP 124/64 | HR 74 | Temp 98.6°F | Ht 65.0 in | Wt 190.0 lb

## 2022-06-24 DIAGNOSIS — F32A Depression, unspecified: Secondary | ICD-10-CM

## 2022-06-24 DIAGNOSIS — K219 Gastro-esophageal reflux disease without esophagitis: Secondary | ICD-10-CM | POA: Diagnosis not present

## 2022-06-24 DIAGNOSIS — I10 Essential (primary) hypertension: Secondary | ICD-10-CM

## 2022-06-24 DIAGNOSIS — E039 Hypothyroidism, unspecified: Secondary | ICD-10-CM

## 2022-06-24 DIAGNOSIS — I6330 Cerebral infarction due to thrombosis of unspecified cerebral artery: Secondary | ICD-10-CM

## 2022-06-24 DIAGNOSIS — R413 Other amnesia: Secondary | ICD-10-CM

## 2022-06-24 DIAGNOSIS — R2689 Other abnormalities of gait and mobility: Secondary | ICD-10-CM | POA: Diagnosis not present

## 2022-06-24 DIAGNOSIS — R29898 Other symptoms and signs involving the musculoskeletal system: Secondary | ICD-10-CM | POA: Diagnosis not present

## 2022-06-24 DIAGNOSIS — E119 Type 2 diabetes mellitus without complications: Secondary | ICD-10-CM

## 2022-06-24 DIAGNOSIS — N3281 Overactive bladder: Secondary | ICD-10-CM | POA: Diagnosis not present

## 2022-06-24 DIAGNOSIS — R69 Illness, unspecified: Secondary | ICD-10-CM | POA: Diagnosis not present

## 2022-06-24 LAB — TSH: TSH: 7.26 u[IU]/mL — ABNORMAL HIGH (ref 0.35–5.50)

## 2022-06-24 LAB — BASIC METABOLIC PANEL
BUN: 23 mg/dL (ref 6–23)
CO2: 25 mEq/L (ref 19–32)
Calcium: 9.4 mg/dL (ref 8.4–10.5)
Chloride: 103 mEq/L (ref 96–112)
Creatinine, Ser: 1.11 mg/dL (ref 0.40–1.20)
GFR: 43.78 mL/min — ABNORMAL LOW (ref >60.00)
Glucose, Bld: 111 mg/dL — ABNORMAL HIGH (ref 70–99)
Potassium: 4.6 mEq/L (ref 3.5–5.1)
Sodium: 137 mEq/L (ref 135–145)

## 2022-06-24 LAB — LIPID PANEL
Cholesterol: 211 mg/dL — ABNORMAL HIGH (ref 0–200)
HDL: 64.9 mg/dL (ref >39.00)
LDL Cholesterol: 119 mg/dL — ABNORMAL HIGH (ref 0–99)
NonHDL: 145.76
Total CHOL/HDL Ratio: 3
Triglycerides: 132 mg/dL (ref 0.0–149.0)
VLDL: 26.4 mg/dL (ref 0.0–40.0)

## 2022-06-24 LAB — CBC WITH DIFFERENTIAL/PLATELET
Basophils Absolute: 0 10*3/uL (ref 0.0–0.1)
Basophils Relative: 0.4 % (ref 0.0–3.0)
Eosinophils Absolute: 0.1 10*3/uL (ref 0.0–0.7)
Eosinophils Relative: 1.3 % (ref 0.0–5.0)
HCT: 33.7 % — ABNORMAL LOW (ref 36.0–46.0)
Hemoglobin: 11.1 g/dL — ABNORMAL LOW (ref 12.0–15.0)
Lymphocytes Relative: 29.5 % (ref 12.0–46.0)
Lymphs Abs: 2.3 10*3/uL (ref 0.7–4.0)
MCHC: 32.8 g/dL (ref 30.0–36.0)
MCV: 89.8 fl (ref 78.0–100.0)
Monocytes Absolute: 0.7 10*3/uL (ref 0.1–1.0)
Monocytes Relative: 8.3 % (ref 3.0–12.0)
Neutro Abs: 4.8 10*3/uL (ref 1.4–7.7)
Neutrophils Relative %: 60.5 % (ref 43.0–77.0)
Platelets: 209 10*3/uL (ref 150.0–400.0)
RBC: 3.75 Mil/uL — ABNORMAL LOW (ref 3.87–5.11)
RDW: 15.2 % (ref 11.5–15.5)
WBC: 7.9 10*3/uL (ref 4.0–10.5)

## 2022-06-24 LAB — HEPATIC FUNCTION PANEL
ALT: 11 U/L (ref 0–35)
AST: 14 U/L (ref 0–37)
Albumin: 4.1 g/dL (ref 3.5–5.2)
Alkaline Phosphatase: 80 U/L (ref 39–117)
Bilirubin, Direct: 0.1 mg/dL (ref 0.0–0.3)
Total Bilirubin: 0.5 mg/dL (ref 0.2–1.2)
Total Protein: 7.6 g/dL (ref 6.0–8.3)

## 2022-06-24 LAB — T4, FREE: Free T4: 0.85 ng/dL (ref 0.60–1.60)

## 2022-06-24 LAB — T3, FREE: T3, Free: 2.4 pg/mL (ref 2.3–4.2)

## 2022-06-24 LAB — HEMOGLOBIN A1C: Hgb A1c MFr Bld: 7 % — ABNORMAL HIGH (ref 4.6–6.5)

## 2022-06-24 MED ORDER — GLIPIZIDE 5 MG PO TABS: 5.0000 mg | ORAL_TABLET | Freq: Two times a day (BID) | ORAL | 11 refills | Status: DC

## 2022-06-24 MED ORDER — LEVOTHYROXINE SODIUM 175 MCG PO TABS: 175.0000 ug | ORAL_TABLET | Freq: Every day | ORAL | 11 refills | Status: DC

## 2022-06-24 MED ORDER — FAMOTIDINE 40 MG PO TABS: 40.0000 mg | ORAL_TABLET | Freq: Every day | ORAL | 11 refills | Status: DC

## 2022-06-24 MED ORDER — AMLODIPINE BESYLATE 5 MG PO TABS: 5.0000 mg | ORAL_TABLET | Freq: Two times a day (BID) | ORAL | 11 refills | Status: DC

## 2022-06-24 MED ORDER — OXYBUTYNIN CHLORIDE ER 10 MG PO TB24: 10.0000 mg | ORAL_TABLET | Freq: Every day | ORAL | 11 refills | Status: DC

## 2022-06-24 MED ORDER — SITAGLIPTIN PHOSPHATE 100 MG PO TABS: 100.0000 mg | ORAL_TABLET | Freq: Every day | ORAL | 11 refills | Status: DC

## 2022-06-24 MED ORDER — GABAPENTIN 300 MG PO CAPS: 300.0000 mg | ORAL_CAPSULE | Freq: Two times a day (BID) | ORAL | 11 refills | Status: DC

## 2022-06-24 MED ORDER — SERTRALINE HCL 50 MG PO TABS: 50.0000 mg | ORAL_TABLET | Freq: Every day | ORAL | 11 refills | Status: DC

## 2022-06-24 MED ORDER — ATORVASTATIN CALCIUM 40 MG PO TABS: 40.0000 mg | ORAL_TABLET | Freq: Every day | ORAL | 11 refills | Status: DC

## 2022-06-24 MED ORDER — OMEPRAZOLE 40 MG PO CPDR: 40.0000 mg | DELAYED_RELEASE_CAPSULE | Freq: Every morning | ORAL | 11 refills | Status: DC

## 2022-06-24 NOTE — Progress Notes (Signed)
   Subjective:    Patient ID: Amy [name], female    DOB: May 05, 1930, 89 y.o.   MRN: [id number]  HPI Here to follow up on issues. We saw her recently for pain in both lower legs, and we wondered if this could be a side effect of her Atorvastatin. We asked her to stop it for a few weeks to see, but this has not helped at all. She is scheduled to see her orthopedist, Dr. Ronnie Derby, 3 days from now. She also complains about a lesion on the left lower leg that is painful and gets irritated by rubbing on clothing. She is taking Oxybutynin 5 mg for her OAB, but she says this is not helping much. She also describes weakness in her arms and legs, and she asks to be referred for therapy to get them stronger. Her memory loss and BP and GERD have ben stable.    Review of Systems     Objective:   Physical Exam        Assessment & Plan:

## 2022-06-24 NOTE — Progress Notes (Signed)
Subjective:    Patient ID: Amy [name], female    DOB: May 21, 1930, 89 y.o.   MRN: [id number]  HPI (To continue the note already started)   Review of Systems  Constitutional: Negative.   HENT: Negative.    Eyes: Negative.   Respiratory: Negative.    Cardiovascular: Negative.   Gastrointestinal: Negative.   Genitourinary:  Positive for urgency. Negative for decreased urine volume, difficulty urinating, dyspareunia, dysuria, enuresis, flank pain, frequency, hematuria and pelvic pain.  Musculoskeletal:  Positive for arthralgias.  Skin: Negative.   Neurological: Negative.  Negative for headaches.  Psychiatric/Behavioral: Negative.         Objective:   Physical Exam Constitutional:      General: She is not in acute distress.    Appearance: Normal appearance. She is well-developed.     Comments: She walks with assistance due to leg pain  HENT:     Head: Normocephalic and atraumatic.     Right Ear: External ear normal.     Left Ear: External ear normal.     Nose: Nose normal.     Mouth/Throat:     Pharynx: No oropharyngeal exudate.  Eyes:     General: No scleral icterus.    Conjunctiva/sclera: Conjunctivae normal.     Pupils: Pupils are equal, round, and reactive to light.  Neck:     Thyroid: No thyromegaly.     Vascular: No JVD.  Cardiovascular:     Rate and Rhythm: Normal rate and regular rhythm.     Pulses: Normal pulses.     Heart sounds: Normal heart sounds. No murmur heard.    No friction rub. No gallop.  Pulmonary:     Effort: Pulmonary effort is normal. No respiratory distress.     Breath sounds: Normal breath sounds. No wheezing or rales.  Chest:     Chest wall: No tenderness.  Abdominal:     General: Bowel sounds are normal. There is no distension.     Palpations: Abdomen is soft. There is no mass.     Tenderness: There is no abdominal tenderness. There is no guarding or rebound.  Musculoskeletal:        General: No tenderness. Normal range of motion.      Cervical back: Normal range of motion and neck supple.  Lymphadenopathy:     Cervical: No cervical adenopathy.  Skin:    General: Skin is warm and dry.     Findings: No erythema or rash.  Neurological:     Mental Status: She is alert and oriented to person, place, and time. Mental status is at baseline.     Cranial Nerves: No cranial nerve deficit.     Motor: No abnormal muscle tone.     Coordination: Coordination normal.     Deep Tendon Reflexes: Reflexes are normal and symmetric. Reflexes normal.  Psychiatric:        Behavior: Behavior normal.        Thought Content: Thought content normal.        Judgment: Judgment normal.           Assessment & Plan:  She is doing well with HTN, GERD, and depression. We recently increase the Aricept to 10 mg daily for the memory loss. We will increase the Oxybutynin to 10 mg daily for the OAB. We will start her back on Atorvastatin for dyslipidemia. She will see Dr. Ronnie Derby later this week for her knee and leg pains. Get fasting labs to check lipids,  A1c, etc. At her request we will refer her for OT and PT to help with arm and leg weakness. We spent a total of (35   ) minutes reviewing records and discussing these issues.  [name], MD

## 2022-06-26 ENCOUNTER — Telehealth: Payer: Self-pay | Admitting: Family Medicine

## 2022-06-26 NOTE — Telephone Encounter (Signed)
Pt left message with triage nurse and would like blood work result

## 2022-06-27 ENCOUNTER — Other Ambulatory Visit: Payer: Self-pay

## 2022-06-27 DIAGNOSIS — E039 Hypothyroidism, unspecified: Secondary | ICD-10-CM

## 2022-06-27 DIAGNOSIS — G8929 Other chronic pain: Secondary | ICD-10-CM | POA: Diagnosis not present

## 2022-06-27 DIAGNOSIS — M25562 Pain in left knee: Secondary | ICD-10-CM | POA: Diagnosis not present

## 2022-06-27 DIAGNOSIS — M25561 Pain in right knee: Secondary | ICD-10-CM | POA: Diagnosis not present

## 2022-06-27 MED ORDER — LEVOTHYROXINE SODIUM 200 MCG PO TABS: 200.0000 ug | ORAL_TABLET | Freq: Every day | ORAL | 3 refills | Status: DC

## 2022-06-27 NOTE — Telephone Encounter (Signed)
Called pt lvm to contact office for lab results

## 2022-06-27 NOTE — Telephone Encounter (Signed)
Spoke with patient about results.

## 2022-06-27 NOTE — Telephone Encounter (Signed)
Patient returned call for results 

## 2022-07-27 IMAGING — DX DG LUMBAR SPINE COMPLETE 4+V
5 series · 5 of 5 positions shown · non-contrast
Comparison: April 10, 2007

CLINICAL DATA: Lower back pain.

EXAM:
LUMBAR SPINE - COMPLETE 4+ VIEW

[l-spine ap]
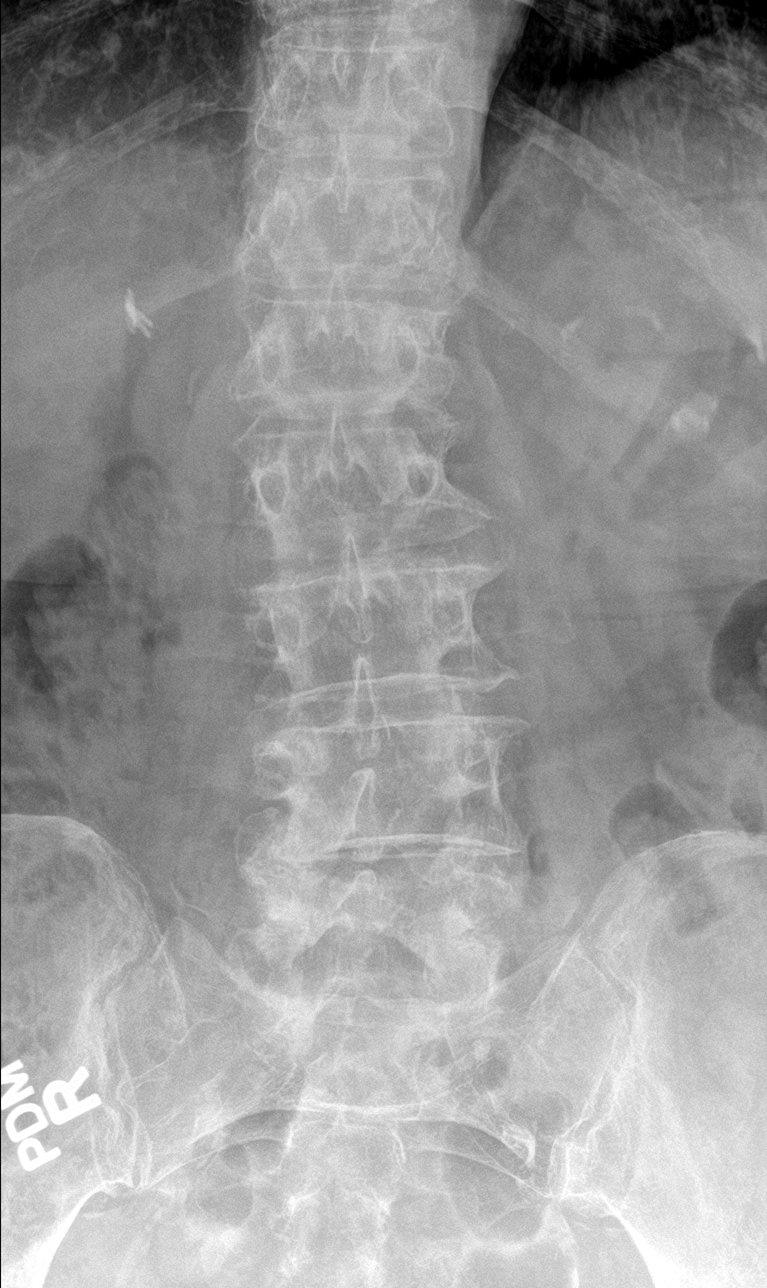

[l-spine obl (1 of 2)]
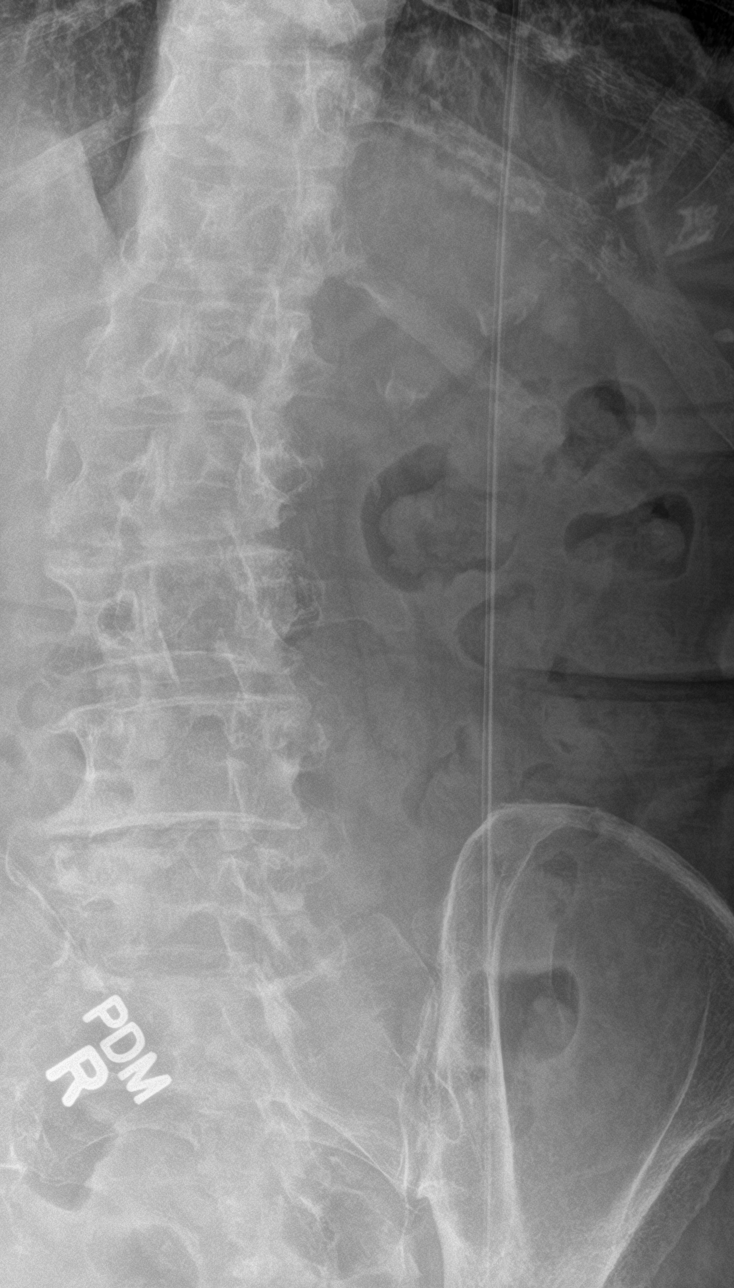

[l-spine obl (2 of 2)]
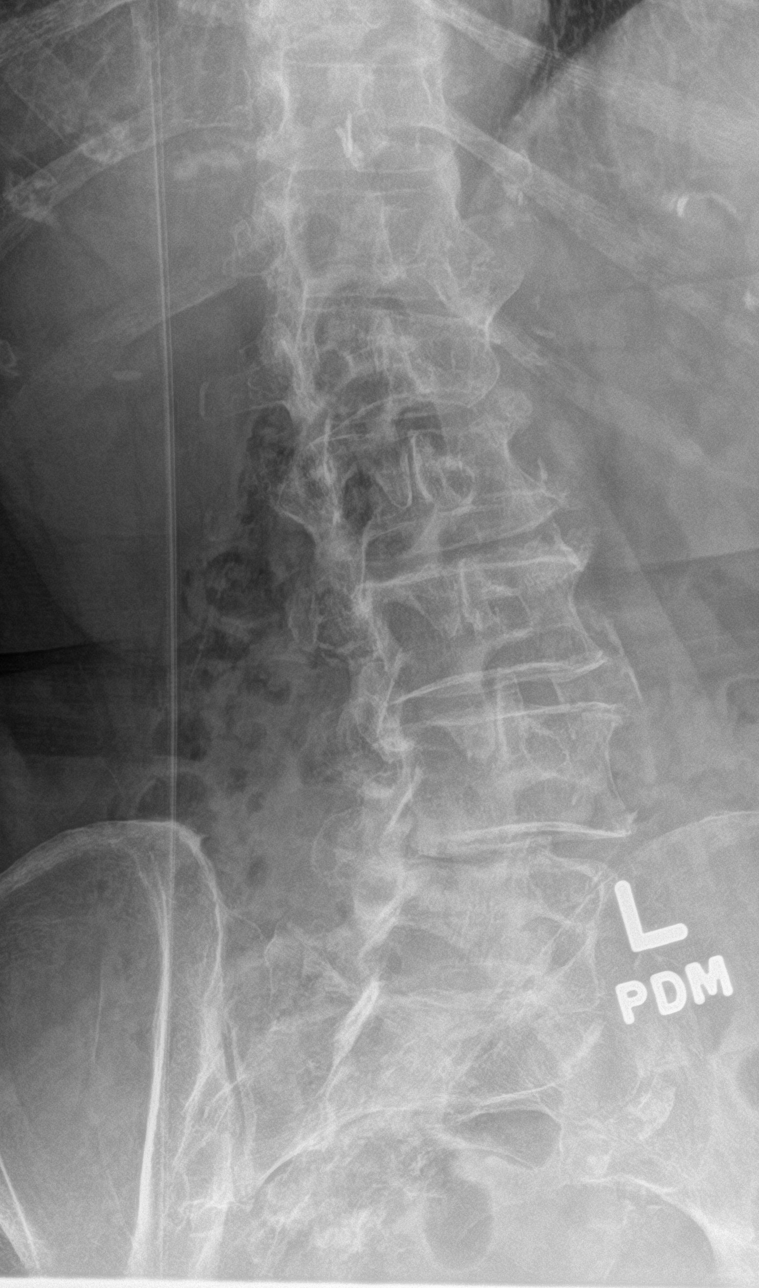

[l-spine lat]
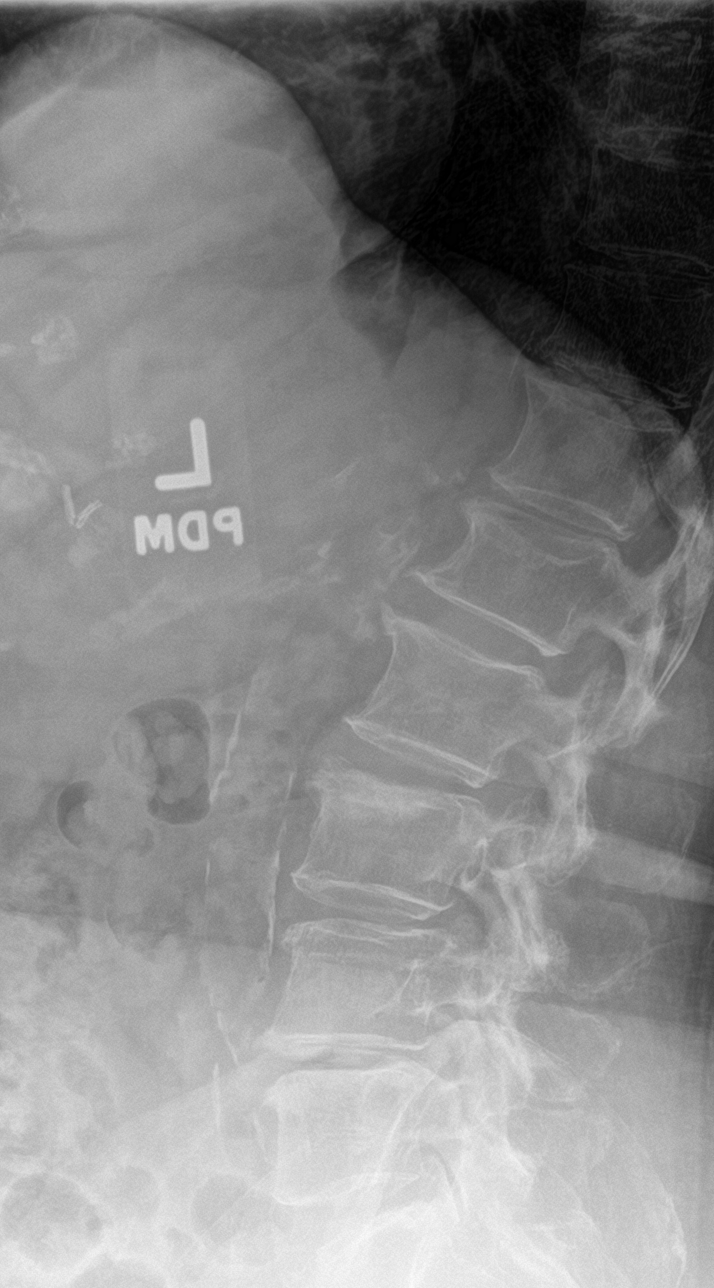

[l-spine spot]
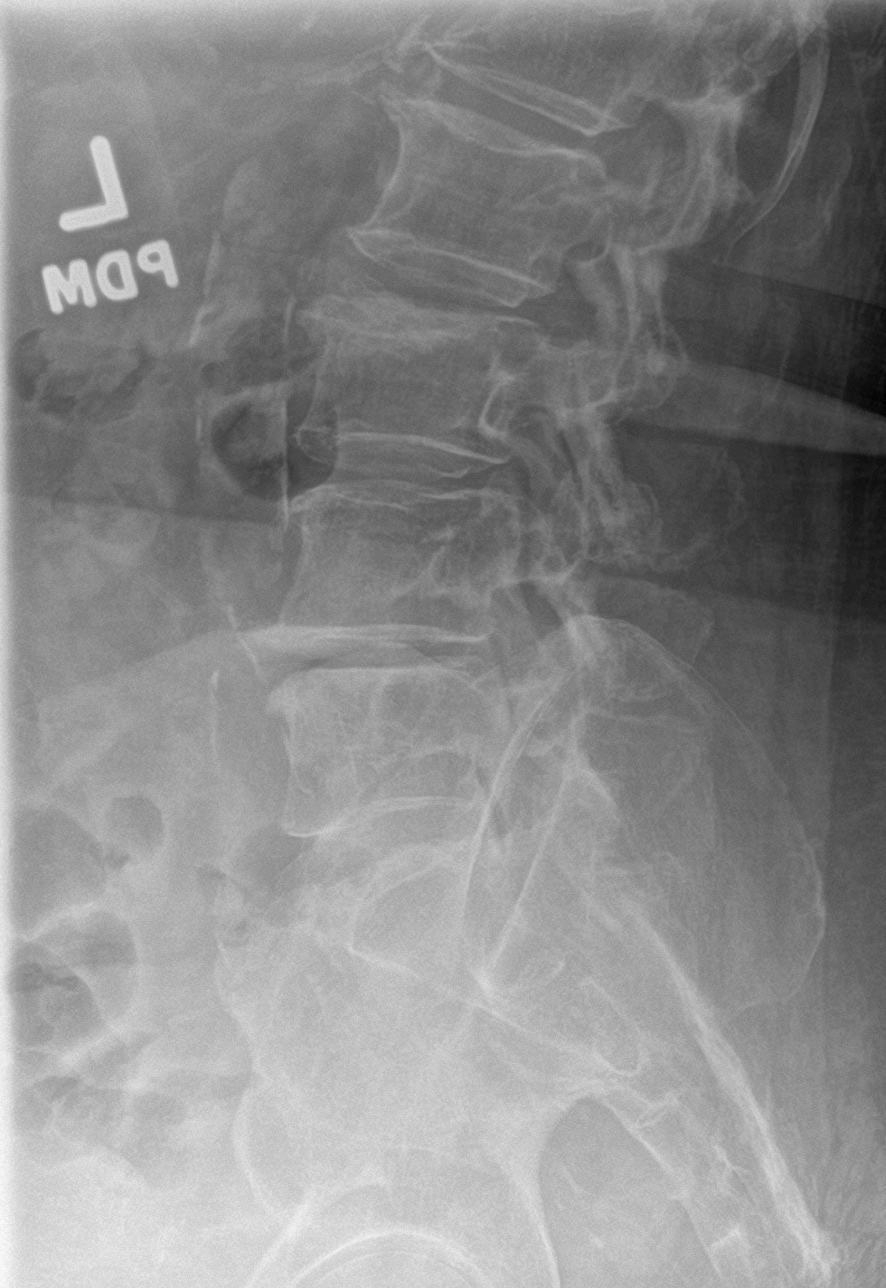

[5 of 5 positions shown; findings below may reference images not displayed]

FINDINGS: There is no evidence of an acute lumbar spine fracture. There is
mild levoscoliosis. Mild to moderate severity endplate sclerosis is
seen throughout the lumbar spine. This is most prominent at the
levels of L2-L3 and L4-L5. Mild multilevel intervertebral disc space
narrowing is seen. There is marked severity calcification of the
abdominal aorta. Radiopaque surgical clips are seen within the right
upper quadrant.
IMPRESSION: Degenerative changes as described above, most prominent at the
levels of L2-L3 and L4-L5.

## 2022-08-02 ENCOUNTER — Other Ambulatory Visit: Payer: Self-pay | Admitting: Family Medicine

## 2022-08-15 ENCOUNTER — Ambulatory Visit (INDEPENDENT_AMBULATORY_CARE_PROVIDER_SITE_OTHER): Payer: Medicare HMO | Admitting: Family Medicine

## 2022-08-15 ENCOUNTER — Encounter: Payer: Self-pay | Admitting: Family Medicine

## 2022-08-15 VITALS — BP 126/78 | HR 96 | Temp 97.6°F | Wt 190.0 lb

## 2022-08-15 DIAGNOSIS — N390 Urinary tract infection, site not specified: Secondary | ICD-10-CM

## 2022-08-15 NOTE — Progress Notes (Signed)
   Subjective:    Patient ID: Amy [name], female    DOB: April 28, 1930, 89 y.o.   MRN: [id number]  HPI Here for 2 days of symptoms she gets whenever she urinates. She denies any urinary pressure or frequency or burning. No fever. She says whenever she urinates she feels generalized weakness and her body "shakes all over". She often gets nauseated at these times, and she actually vomited once last night. No head congestion or ST or cough. No abdominal or back pain.    Review of Systems  Constitutional:  Positive for chills. Negative for diaphoresis and fever.  HENT: Negative.    Eyes: Negative.   Respiratory: Negative.    Cardiovascular: Negative.   Gastrointestinal: Negative.   Genitourinary:  Negative for difficulty urinating, dysuria, flank pain, frequency, hematuria and urgency.  Neurological: Negative.        Objective:   Physical Exam Constitutional:      Appearance: Normal appearance. She is not ill-appearing.     Comments: Walks with her walker  Cardiovascular:     Rate and Rhythm: Normal rate and regular rhythm.     Pulses: Normal pulses.     Heart sounds: Normal heart sounds.  Pulmonary:     Effort: Pulmonary effort is normal.     Breath sounds: Normal breath sounds.  Abdominal:     General: Abdomen is flat. Bowel sounds are normal. There is no distension.     Palpations: Abdomen is soft. There is no mass.     Tenderness: There is no abdominal tenderness. There is no right CVA tenderness, left CVA tenderness, guarding or rebound.     Hernia: No hernia is present.  Neurological:     Mental Status: She is alert.           Assessment & Plan:  She is unable to provide a urine sample today, but a UTI is the most likely explanation for her symptoms. We will treat with 7 days of Macrobid 100 mg BID. She will drink plenty of water. Recheck as needed.  [name], MD

## 2022-08-16 ENCOUNTER — Telehealth: Payer: Self-pay | Admitting: Family Medicine

## 2022-08-16 DIAGNOSIS — N390 Urinary tract infection, site not specified: Secondary | ICD-10-CM

## 2022-08-16 MED ORDER — NITROFURANTOIN MONOHYD MACRO 100 MG PO CAPS: 100.0000 mg | ORAL_CAPSULE | Freq: Two times a day (BID) | ORAL | 0 refills | Status: DC

## 2022-08-16 NOTE — Telephone Encounter (Signed)
Received a call from triage nurse. Amy [name] noted she was seen recently by Amy [name]. She had symptoms of a UTI. She was told she would receive an antibiotic. At this point, no antibiotic prescription was received by her pharmacy. She tried calling the clinic late yesterday. She was told to call back on Monday. She is still having symptoms and was worried about waiting two more days.  I reviewed Dr. Barbie Banner note from yesterday. He noted a clinical diagnosis of UTI. He stated he did intend to prescribe nitrofurantoin. However, I cannot see that a prescription was actually sent in as part of the encounter.  1. Urinary tract infection without hematuria, site unspecified I will send in the prescription as planned by Amy [name].  - nitrofurantoin, macrocrystal-monohydrate, (MACROBID) 100 MG capsule; Take 1 capsule (100 mg total) by mouth 2 (two) times daily.  Dispense: 10 capsule; Refill: 0  [name], MD

## 2022-08-25 ENCOUNTER — Telehealth: Payer: Self-pay | Admitting: Family Medicine

## 2022-08-25 NOTE — Telephone Encounter (Signed)
*  See telephone message on 08/16/22 from Dr. Arlester Marker.*  Last OV-08/15/22

## 2022-08-25 NOTE — Telephone Encounter (Signed)
Pt seen on 08/15/22 for UTI, patient states she is not getting better and asking if she needs more meds or to come back in for an OV

## 2022-08-26 NOTE — Telephone Encounter (Signed)
She should have another OV, and this time she MUST give [place] a urine sample. Tell her to avoid urinating for 4 hours before her appt time

## 2022-08-26 NOTE — Telephone Encounter (Signed)
Spoke with patient.    Discussed message in detail.   Appt was previously made this morning for patient.

## 2022-08-27 ENCOUNTER — Ambulatory Visit (INDEPENDENT_AMBULATORY_CARE_PROVIDER_SITE_OTHER): Payer: Medicare HMO | Admitting: Family Medicine

## 2022-08-27 ENCOUNTER — Encounter: Payer: Self-pay | Admitting: Family Medicine

## 2022-08-27 VITALS — BP 124/68 | HR 78 | Temp 98.2°F | Wt 190.0 lb

## 2022-08-27 DIAGNOSIS — E039 Hypothyroidism, unspecified: Secondary | ICD-10-CM

## 2022-08-27 DIAGNOSIS — R5383 Other fatigue: Secondary | ICD-10-CM

## 2022-08-27 DIAGNOSIS — N390 Urinary tract infection, site not specified: Secondary | ICD-10-CM | POA: Diagnosis not present

## 2022-08-27 LAB — POC URINALSYSI DIPSTICK (AUTOMATED)
Bilirubin, UA: NEGATIVE
Blood, UA: NEGATIVE
Glucose, UA: NEGATIVE
Ketones, UA: NEGATIVE
Leukocytes, UA: NEGATIVE
Nitrite, UA: NEGATIVE
Protein, UA: NEGATIVE
Spec Grav, UA: 1.015 (ref 1.010–1.025)
Urobilinogen, UA: 0.2 E.U./dL
pH, UA: 7 (ref 5.0–8.0)

## 2022-08-27 NOTE — Progress Notes (Signed)
   Subjective:    Patient ID: Amy [name], female    DOB: 1929-07-01, 89 y.o.   MRN: [id number]  HPI Here to follow up on a presumed UTI and to discuss general feelings of tiredness and "not feeling good" she has been experiencing the past 6 months or so. We saw her on 06-24-22 and labs revealed her A1c to be 7.0%, Hgb was stable at 11.1, and creatinine was stable at 1.11. However her TSH was up to 7.26, so we increased the Levothyroxine dose from 175 mcg to 200 mcg daily. Then we saw her on 08-15-22 with symptoms of a presumed UTI (she could not produce a urine sample), so we treated her empirically with 7 days of Macrobid. Now the urinary symptoms are gone, and her UA today is clear. Her appetite and sleep patterns have not changed, and her weight has been stable for the past several years.   Review of Systems  Constitutional:  Positive for fatigue.  Respiratory: Negative.    Cardiovascular: Negative.   Gastrointestinal: Negative.   Genitourinary: Negative.   Neurological: Negative.        Objective:   Physical Exam Constitutional:      Appearance: Normal appearance.  Cardiovascular:     Rate and Rhythm: Normal rate and regular rhythm.     Pulses: Normal pulses.     Heart sounds: Normal heart sounds.  Pulmonary:     Effort: Pulmonary effort is normal.     Breath sounds: Normal breath sounds.  Abdominal:     Tenderness: There is no right CVA tenderness or left CVA tenderness.  Neurological:     Mental Status: She is alert and oriented to person, place, and time. Mental status is at baseline.           Assessment & Plan:  He recent UTI has resolved. She was hypothyroid 2 months ago, and this could certainly explain her fatigue. We will check a thyroid panel today to see where we are.  [name], MD

## 2022-08-27 NOTE — Addendum Note (Signed)
Addended by: Wyvonne Lenz on: 08/27/2022 11:36 AM   Modules accepted: Orders

## 2022-08-28 ENCOUNTER — Encounter: Payer: Self-pay | Admitting: Family Medicine

## 2022-08-28 LAB — T4, FREE: Free T4: 1.42 ng/dL (ref 0.82–1.77)

## 2022-08-28 LAB — TSH: TSH: 0.802 u[IU]/mL (ref 0.450–4.500)

## 2022-08-28 LAB — T3, FREE: T3, Free: 2.2 pg/mL (ref 2.0–4.4)

## 2022-09-23 ENCOUNTER — Other Ambulatory Visit (HOSPITAL_COMMUNITY): Payer: Self-pay

## 2022-09-29 ENCOUNTER — Other Ambulatory Visit: Payer: Medicare HMO

## 2022-10-03 ENCOUNTER — Other Ambulatory Visit: Payer: Self-pay

## 2022-10-03 MED ORDER — GLIPIZIDE 5 MG PO TABS: 5.0000 mg | ORAL_TABLET | Freq: Two times a day (BID) | ORAL | 11 refills | Status: DC

## 2022-10-13 ENCOUNTER — Other Ambulatory Visit: Payer: Medicare HMO

## 2022-10-17 ENCOUNTER — Other Ambulatory Visit: Payer: Medicare HMO

## 2022-11-24 ENCOUNTER — Ambulatory Visit: Payer: Medicare HMO | Admitting: Family Medicine

## 2022-11-24 ENCOUNTER — Encounter: Payer: Self-pay | Admitting: Family Medicine

## 2022-11-24 VITALS — BP 120/70 | HR 69 | Temp 98.2°F | Wt 190.0 lb

## 2022-11-24 DIAGNOSIS — E119 Type 2 diabetes mellitus without complications: Secondary | ICD-10-CM | POA: Diagnosis not present

## 2022-11-24 DIAGNOSIS — I10 Essential (primary) hypertension: Secondary | ICD-10-CM

## 2022-11-24 DIAGNOSIS — E039 Hypothyroidism, unspecified: Secondary | ICD-10-CM | POA: Diagnosis not present

## 2022-11-24 DIAGNOSIS — Z7984 Long term (current) use of oral hypoglycemic drugs: Secondary | ICD-10-CM | POA: Diagnosis not present

## 2022-11-24 DIAGNOSIS — R300 Dysuria: Secondary | ICD-10-CM | POA: Diagnosis not present

## 2022-11-24 DIAGNOSIS — N390 Urinary tract infection, site not specified: Secondary | ICD-10-CM

## 2022-11-24 LAB — POCT URINALYSIS DIPSTICK
Bilirubin, UA: NEGATIVE
Clarity, UA: NEGATIVE
Color, UA: NEGATIVE
Glucose, UA: NEGATIVE
Ketones, UA: NEGATIVE
Nitrite, UA: POSITIVE
Protein, UA: POSITIVE — AB
Spec Grav, UA: 1.02 (ref 1.010–1.025)
Urobilinogen, UA: 0.2 E.U./dL
pH, UA: 5 (ref 5.0–8.0)

## 2022-11-24 MED ORDER — NITROFURANTOIN MONOHYD MACRO 100 MG PO CAPS
100.0000 mg | ORAL_CAPSULE | Freq: Two times a day (BID) | ORAL | 0 refills | Status: DC
Start: 2022-11-24 — End: 2022-11-24

## 2022-11-24 MED ORDER — NITROFURANTOIN MONOHYD MACRO 100 MG PO CAPS
100.0000 mg | ORAL_CAPSULE | Freq: Two times a day (BID) | ORAL | 0 refills | Status: DC
Start: 2022-11-24 — End: 2022-12-02

## 2022-11-24 MED ORDER — GABAPENTIN 300 MG PO CAPS: 300.0000 mg | ORAL_CAPSULE | Freq: Every day | ORAL | 11 refills | Status: DC

## 2022-11-24 NOTE — Progress Notes (Signed)
   Subjective:    Patient ID: Amy [name], female    DOB: [birth year], 89 y.o.   MRN: [id number]  HPI Here for a week of feeling very fatigued and having an increased urge to urinate. No burning or abdominal pain or back pain. No fever or nausea. She describes a "strange feeling" that comes over her body as she passes urine. She sleeps well but her appetite has decreased a little.    Review of Systems  Constitutional:  Positive for fatigue. Negative for fever.  Respiratory: Negative.    Cardiovascular: Negative.   Gastrointestinal: Negative.   Genitourinary:  Positive for frequency. Negative for dysuria, flank pain, hematuria and urgency.       Objective:   Physical Exam Constitutional:      Appearance: Normal appearance. She is not ill-appearing.  Cardiovascular:     Rate and Rhythm: Normal rate and regular rhythm.     Pulses: Normal pulses.     Heart sounds: Normal heart sounds.  Pulmonary:     Effort: Pulmonary effort is normal.     Breath sounds: Normal breath sounds.  Abdominal:     General: Abdomen is flat. Bowel sounds are normal. There is no distension.     Palpations: Abdomen is soft. There is no mass.     Tenderness: There is no abdominal tenderness. There is no right CVA tenderness, left CVA tenderness, guarding or rebound.     Hernia: No hernia is present.  Neurological:     General: No focal deficit present.     Mental Status: She is alert and oriented to person, place, and time.           Assessment & Plan:  UTI, treat with 7 days of Macrobid. Culture the sample.  Gershon Crane, MD

## 2022-11-27 LAB — URINE CULTURE
MICRO NUMBER:: 15147344
SPECIMEN QUALITY:: ADEQUATE

## 2022-12-01 ENCOUNTER — Telehealth: Payer: Self-pay | Admitting: Family Medicine

## 2022-12-01 ENCOUNTER — Ambulatory Visit (INDEPENDENT_AMBULATORY_CARE_PROVIDER_SITE_OTHER): Payer: Medicare HMO

## 2022-12-01 VITALS — Ht 67.0 in | Wt 190.0 lb

## 2022-12-01 DIAGNOSIS — Z0000 Encounter for general adult medical examination without abnormal findings: Secondary | ICD-10-CM | POA: Diagnosis not present

## 2022-12-01 DIAGNOSIS — N390 Urinary tract infection, site not specified: Secondary | ICD-10-CM

## 2022-12-01 NOTE — Progress Notes (Signed)
Subjective:   Amy [name] is a 89 y.o. female who presents for Medicare Annual (Subsequent) preventive examination.  Visit Complete: Virtual  I connected with  Lonzo Candy on 12/03/22 by a audio enabled telemedicine application and verified that I am speaking with the correct person using two identifiers.  Patient Location: Home  Provider Location: Home Office  I discussed the limitations of evaluation and management by telemedicine. The patient expressed understanding and agreed to proceed.  Patient Medicare AWV questionnaire was completed by the patient on ; I have confirmed that all information answered by patient is correct and no changes since this date.  Review of Systems          Objective:    Today's Vitals   12/01/22 1118  Weight: 190 lb (86.2 kg)  Height: 5\' 7"  (1.702 m)   Body mass index is 29.76 kg/m.     12/01/2022   11:31 AM 12/30/2021    2:36 PM 04/04/2021   11:24 AM 04/28/2018    6:37 PM 04/25/2016    5:52 PM 04/22/2016   11:50 PM 04/22/2016    6:15 PM  Advanced Directives  Does Patient Have a Medical Advance Directive? Yes Yes Yes No Yes Yes No  Type of Estate agent of [place];Living will Healthcare Power of [place];Living will Healthcare Power of [place];Living will  Healthcare Power of [place];Living will Healthcare Power of Attorney   Does patient want to make changes to medical advance directive?   No - Patient declined  No - Patient declined No - Patient declined   Copy of Healthcare Power of Attorney in Chart? No - copy requested No - copy requested No - copy requested  No - copy requested No - copy requested   Would patient like information on creating a medical advance directive?    No - Patient declined  No - Patient declined No - Patient declined    Current Medications (verified) Outpatient Encounter Medications as of 12/01/2022  Medication Sig   amLODipine (NORVASC) 5 MG tablet Take 1 tablet (5 mg total) by  mouth 2 (two) times daily.   aspirin 81 MG tablet Take 1 tablet (81 mg total) by mouth daily.   atorvastatin (LIPITOR) 40 MG tablet Take 1 tablet (40 mg total) by mouth daily.   blood glucose meter kit and supplies Dispense based on patient and insurance preference. Use up to four times daily as directed. (FOR ICD-10 E10.9, E11.9).   docusate sodium (COLACE) 100 MG capsule Take 1 capsule (100 mg total) by mouth 2 (two) times daily.   donepezil (ARICEPT) 10 MG tablet Take 1 tablet (10 mg total) by mouth at bedtime.   famotidine (PEPCID) 40 MG tablet Take 1 tablet (40 mg total) by mouth at bedtime.   glipiZIDE (GLUCOTROL) 5 MG tablet Take 1 tablet (5 mg total) by mouth 2 (two) times daily before a meal.   ibuprofen (ADVIL,MOTRIN) 200 MG tablet Take 400 mg by mouth at bedtime as needed (RLS).   levothyroxine (SYNTHROID) 200 MCG tablet Take 1 tablet (200 mcg total) by mouth daily.   meclizine (ANTIVERT) 25 MG tablet Take 1 tablet (25 mg total) by mouth 3 (three) times daily as needed for dizziness.   metFORMIN (GLUCOPHAGE) 500 MG tablet TAKE ONE TABLET BY MOUTH TWICE DAILY   Multiple Vitamin (MULTIVITAMIN WITH MINERALS) TABS tablet Take 1 tablet by mouth daily.   omeprazole (PRILOSEC) 40 MG capsule Take 1 capsule (40 mg total) by mouth in  the morning.   oxybutynin (DITROPAN XL) 10 MG 24 hr tablet Take 1 tablet (10 mg total) by mouth at bedtime.   sertraline (ZOLOFT) 50 MG tablet Take 1 tablet (50 mg total) by mouth daily.   sitaGLIPtin (JANUVIA) 100 MG tablet Take 1 tablet (100 mg total) by mouth daily.   traMADol (ULTRAM) 50 MG tablet TAKE 2 TABLETS EVERY 6 HOURS AS NEEDED FOR MODERATE PAIN.   [DISCONTINUED] nitrofurantoin, macrocrystal-monohydrate, (MACROBID) 100 MG capsule Take 1 capsule (100 mg total) by mouth 2 (two) times daily.   No facility-administered encounter medications on file as of 12/01/2022.    Allergies (verified) Ativan [lorazepam], Diclofenac sodium, Feldene [piroxicam], Latex,  and Tape   History: Past Medical History:  Diagnosis Date   ABSCESS 12/18/2009   DIABETES MELLITUS, TYPE II 05/22/2009   Diverticulosis    Hemorrhoids    HYPERGLYCEMIA 05/22/2009   HYPERTENSION 04/13/2007   HYPOTHYROIDISM 04/13/2007   NEOPLASM, SKIN, UNCERTAIN BEHAVIOR 01/11/2010   Stroke (HCC)    Tubular adenoma of colon 02/2001   Past Surgical History:  Procedure Laterality Date   ABDOMINAL HYSTERECTOMY     APPENDECTOMY     CHOLECYSTECTOMY     COLONOSCOPY  04-05-04   per Dr. Arlyce Dice, no polyps    OOPHORECTOMY     THYROIDECTOMY     secondary to nodule, no cancer   TONSILLECTOMY AND ADENOIDECTOMY     Family History  Problem Relation Age of Onset   Stroke Father    Hypertension Mother    Stroke Mother    Huntington's disease Daughter 107   Colon cancer Neg Hx    Social History   Socioeconomic History   Marital status: Widowed    Spouse name: Not on file   Number of children: 3   Years of education: Not on file   Highest education level: Not on file  Occupational History   Occupation: Retired  Tobacco Use   Smoking status: Never   Smokeless tobacco: Never  Vaping Use   Vaping Use: Never used  Substance and Sexual Activity   Alcohol use: No    Alcohol/week: 0.0 standard drinks of alcohol   Drug use: No   Sexual activity: Not on file  Other Topics Concern   Not on file  Social History Narrative   Daily caffeine    Social Determinants of Health   Financial Resource Strain: Low Risk  (12/01/2022)   Overall Financial Resource Strain (CARDIA)    Difficulty of Paying Living Expenses: Not hard at all  Food Insecurity: No Food Insecurity (12/01/2022)   Hunger Vital Sign    Worried About Running Out of Food in the Last Year: Never true    Ran Out of Food in the Last Year: Never true  Transportation Needs: No Transportation Needs (12/01/2022)   PRAPARE - Administrator, Civil Service (Medical): No    Lack of Transportation (Non-Medical): No  Physical  Activity: Inactive (12/01/2022)   Exercise Vital Sign    Days of Exercise per Week: 0 days    Minutes of Exercise per Session: 0 min  Stress: No Stress Concern Present (12/01/2022)   [employer] of Occupational Health - Occupational Stress Questionnaire    Feeling of Stress : Not at all  Social Connections: Moderately Integrated (12/01/2022)   Social Connection and Isolation Panel [NHANES]    Frequency of Communication with Friends and Family: More than three times a week    Frequency of Social Gatherings with Friends and  Family: More than three times a week    Attends Religious Services: More than 4 times per year    Active Member of Clubs or Organizations: Yes    Attends Banker Meetings: More than 4 times per year    Marital Status: Widowed    Tobacco Counseling Counseling given: Not Answered   Clinical Intake:  Pre-visit preparation completed: No  Pain : No/denies pain     BMI - recorded: 29.76 Nutritional Status: BMI 25 -29 Overweight Nutritional Risks: None Diabetes: Yes CBG done?: No Did pt. bring in CBG monitor from home?: No  How often do you need to have someone help you when you read instructions, pamphlets, or other written materials from your doctor or pharmacy?: 1 - Never  Interpreter Needed?: No  Information entered by :: Theresa Mulligan LPN   Activities of Daily Living    12/01/2022   11:28 AM 12/30/2021    2:41 PM  In your present state of health, do you have any difficulty performing the following activities:  Hearing? 1 0  Comment Wears hearing aids   Vision? 0 0  Difficulty concentrating or making decisions? 0 0  Walking or climbing stairs? 0 0  Dressing or bathing? 0 0  Doing errands, shopping? 0 0  Preparing Food and eating ? N N  Using the Toilet? N N  In the past six months, have you accidently leaked urine? N Y  Do you have problems with loss of bowel control? N N  Managing your Medications? N N  Comment  pre packaged   Managing your Finances? N N  Housekeeping or managing your Housekeeping? N N    Patient Care Team: Nelwyn Salisbury, MD as PCP - General (Family Medicine)  Indicate any recent Medical Services you may have received from other than [facility] providers in the past year (date may be approximate).     Assessment:   This is a routine wellness examination for [place].  Hearing/Vision screen Hearing Screening - Comments:: Wears hearing aids Vision Screening - Comments:: Wears rx glasses - up to date with routine eye exams with  [facility]  Dietary issues and exercise activities discussed:     Goals Addressed               This Visit's Progress     Stay Healthy (pt-stated)        Stay on my feet (pt-stated)         Depression Screen    12/01/2022   11:27 AM 01/06/2022    2:34 PM 12/30/2021    2:37 PM 12/09/2021    2:23 PM 06/03/2021   11:54 AM 04/04/2021   11:23 AM 04/09/2020   10:47 AM  PHQ 2/9 Scores  PHQ - 2 Score 0 1 0 0 0 0 0  PHQ- 9 Score  3  0 1      Fall Risk    12/01/2022   11:29 AM 11/24/2022   10:11 AM 06/24/2022    9:45 AM 01/06/2022    2:35 PM 12/30/2021    2:37 PM  Fall Risk   Falls in the past year? 1 0 0 1 1  Comment     not sure what happened  Number falls in past yr: 0 0 0  0  Injury with Fall? 0 0 0 1 0  Risk for fall due to : No Fall Risks No Fall Risks No Fall Risks Impaired balance/gait Impaired balance/gait;Impaired mobility;Medication side effect  Follow up Falls prevention discussed Falls evaluation completed Falls evaluation completed Falls evaluation completed Falls evaluation completed;Education provided;Falls prevention discussed    MEDICARE RISK AT HOME:   TIMED UP AND GO:  Was the test performed?  No    Cognitive Function:        12/01/2022   11:30 AM 12/30/2021    2:44 PM  6CIT Screen  What Year? 0 points 4 points  What month? 0 points 0 points  What time? 0 points 0 points  Count back from 20 0 points 0 points  Months in reverse 0  points 0 points  Repeat phrase 0 points 2 points  Total Score 0 points 6 points    Immunizations Immunization History  Administered Date(s) Administered   Fluad Quad(high Dose 65+) 03/17/2019, 02/07/2020   Influenza Split 03/24/2011, 02/17/2012   Influenza Whole 03/17/2007, 02/10/2008, 04/04/2009, 04/09/2010   Influenza, High Dose Seasonal PF 04/01/2013, 03/12/2015, 03/12/2016, 03/10/2018   Influenza-Unspecified 04/04/2014   Moderna Sars-Covid-2 Vaccination 06/13/2019, 07/11/2019, 03/22/2020   Pneumococcal Conjugate-13 05/16/2016   Pneumococcal Polysaccharide-23 06/26/1998   Zoster, Live 12/03/2011     Pneumococcal vaccine status: Up to date  Covid-19 vaccine status: Completed vaccines  Qualifies for Shingles Vaccine? Yes   Zostavax completed No   Shingrix Completed?: No.    Education has been provided regarding the importance of this vaccine. Patient has been advised to call insurance company to determine out of pocket expense if they have not yet received this vaccine. Advised may also receive vaccine at local pharmacy or Health Dept. Verbalized acceptance and understanding.  Screening Tests Health Maintenance  Topic Date Due   FOOT EXAM  07/24/2023 (Originally 04/25/2014)   OPHTHALMOLOGY EXAM  07/24/2023 (Originally 11/16/2021)   COVID-19 Vaccine (4 - 2023-24 season) 07/24/2023 (Originally 01/24/2022)   Zoster Vaccines- Shingrix (1 of 2) 07/24/2023 (Originally 01/16/1949)   DEXA SCAN  07/23/2024 (Originally 01/15/1997)   HEMOGLOBIN A1C  12/23/2022   INFLUENZA VACCINE  12/25/2022   Medicare Annual Wellness (AWV)  12/01/2023   Pneumonia Vaccine 28+ Years old  Completed   HPV VACCINES  Aged Out   DTaP/Tdap/Td  Discontinued    Health Maintenance  There are no preventive care reminders to display for this patient.   Colorectal cancer screening: No longer required.   Mammogram status: No longer required due to Age.    Lung Cancer Screening: (Low Dose CT Chest recommended  if Age 11-80 years, 20 pack-year currently smoking OR have quit w/in 15years.) does not qualify.    Additional Screening:  Hepatitis C Screening: does not qualify; Completed   Vision Screening: Recommended annual ophthalmology exams for early detection of glaucoma and other disorders of the eye. Is the patient up to date with their annual eye exam?  Yes  Who is the provider or what is the name of the office in which the patient attends annual eye exams? [facility] If pt is not established with a provider, would they like to be referred to a provider to establish care? No .   Dental Screening: Recommended annual dental exams for proper oral hygiene  Diabetic Foot Exam: Diabetic Foot Exam: Overdue, Pt has been advised about the importance in completing this exam. Pt is scheduled for diabetic foot exam on Followed by PCP.  Community Resource Referral / Chronic Care Management:  CRR required this visit?  No   CCM required this visit?  No     Plan:     I have personally reviewed and noted  the following in the patient's chart:   Medical and social history Use of alcohol, tobacco or illicit drugs  Current medications and supplements including opioid prescriptions. Patient currently taking opioids Functional ability and status Nutritional status Physical activity Advanced directives List of other physicians Hospitalizations, surgeries, and ER visits in previous 12 months Vitals Screenings to include cognitive, depression, and falls Referrals and appointments  In addition, I have reviewed and discussed with patient certain preventive protocols, quality metrics, and best practice recommendations. A written personalized care plan for preventive services as well as general preventive health recommendations were provided to patient.     Tillie Rung, LPN   09/06/2438   After Visit Summary: (MyChart) Due to this being a telephonic visit, the after visit summary with patients  personalized plan was offered to patient via MyChart   Nurse Notes: None

## 2022-12-01 NOTE — Telephone Encounter (Signed)
Pt was seen last week for a Bladder Infection. Pt states she has finished all of her antibiotics. Pt says she is better, but still not well. Pt is wondering if she should have another round of abx?  Please advise.

## 2022-12-01 NOTE — Patient Instructions (Addendum)
[name] , Thank you for taking time to come for your Medicare Wellness Visit. I appreciate your ongoing commitment to your health goals. Please review the following plan we discussed and let me know if I can assist you in the future.   These are the goals we discussed:  Goals       Patient Stated      12/30/2021, wants to lose weight      Stay Healthy (pt-stated)      Stay on my feet (pt-stated)        This is a list of the screening recommended for you and due dates:  Health Maintenance  Topic Date Due   Complete foot exam   07/24/2023*   Eye exam for diabetics  07/24/2023*   COVID-19 Vaccine (4 - 2023-24 season) 07/24/2023*   Zoster (Shingles) Vaccine (1 of 2) 07/24/2023*   DEXA scan (bone density measurement)  07/23/2024*   Hemoglobin A1C  12/23/2022   Flu Shot  12/25/2022   Medicare Annual Wellness Visit  12/01/2023   Pneumonia Vaccine  Completed   HPV Vaccine  Aged Out   DTaP/Tdap/Td vaccine  Discontinued  *Topic was postponed. The date shown is not the original due date.   Opioid Pain Medicine Management Opioids are powerful medicines that are used to treat moderate to severe pain. When used for short periods of time, they can help you to: Sleep better. Do better in physical or occupational therapy. Feel better in the first few days after an injury. Recover from surgery. Opioids should be taken with the supervision of a trained health care provider. They should be taken for the shortest period of time possible. This is because opioids can be addictive, and the longer you take opioids, the greater your risk of addiction. This addiction can also be called opioid use disorder. What are the risks? Using opioid pain medicines for longer than 3 days increases your risk of side effects. Side effects include: Constipation. Nausea and vomiting. Breathing difficulties (respiratory depression). Drowsiness. Confusion. Opioid use disorder. Itching. Taking opioid pain medicine  for a long period of time can affect your ability to do daily tasks. It also puts you at risk for: Motor vehicle crashes. Depression. Suicide. Heart attack. Overdose, which can be life-threatening. What is a pain treatment plan? A pain treatment plan is an agreement between you and your health care provider. Pain is unique to each person, and treatments vary depending on your condition. To manage your pain, you and your health care provider need to work together. To help you do this: Discuss the goals of your treatment, including how much pain you might expect to have and how you will manage the pain. Review the risks and benefits of taking opioid medicines. Remember that a good treatment plan uses more than one approach and minimizes the chance of side effects. Be honest about the amount of medicines you take and about any drug or alcohol use. Get pain medicine prescriptions from only one health care provider. Pain can be managed with many types of alternative treatments. Ask your health care provider to refer you to one or more specialists who can help you manage pain through: Physical or occupational therapy. Counseling (cognitive behavioral therapy). Good nutrition. Biofeedback. Massage. Meditation. Non-opioid medicine. Following a gentle exercise program. How to use opioid pain medicine Taking medicine Take your pain medicine exactly as told by your health care provider. Take it only when you need it. If your pain gets less severe,  you may take less than your prescribed dose if your health care provider approves. If you are not having pain, do nottake pain medicine unless your health care provider tells you to take it. If your pain is severe, do nottry to treat it yourself by taking more pills than instructed on your prescription. Contact your health care provider for help. Write down the times when you take your pain medicine. It is easy to become confused while on pain medicine.  Writing the time can help you avoid overdose. Take other over-the-counter or prescription medicines only as told by your health care provider. Keeping yourself and others safe  While you are taking opioid pain medicine: Do not drive, use machinery, or power tools. Do not sign legal documents. Do not drink alcohol. Do not take sleeping pills. Do not supervise children by yourself. Do not do activities that require climbing or being in high places. Do not go to a lake, river, ocean, spa, or swimming pool. Do not share your pain medicine with anyone. Keep pain medicine in a locked cabinet or in a secure area where pets and children cannot reach it. Stopping your use of opioids If you have been taking opioid medicine for more than a few weeks, you may need to slowly decrease (taper) how much you take until you stop completely. Tapering your use of opioids can decrease your risk of symptoms of withdrawal, such as: Pain and cramping in the abdomen. Nausea. Sweating. Sleepiness. Restlessness. Uncontrollable shaking (tremors). Cravings for the medicine. Do not attempt to taper your use of opioids on your own. Talk with your health care provider about how to do this. Your health care provider may prescribe a step-down schedule based on how much medicine you are taking and how long you have been taking it. Getting rid of leftover pills Do not save any leftover pills. Get rid of leftover pills safely by: Taking the medicine to a prescription take-back program. This is usually offered by the county or law enforcement. Bringing them to a pharmacy that has a drug disposal container. Flushing them down the toilet. Check the label or package insert of your medicine to see whether this is safe to do. Throwing them out in the trash. Check the label or package insert of your medicine to see whether this is safe to do. If it is safe to throw it out, remove the medicine from the original container, put it  into a sealable bag or container, and mix it with used coffee grounds, food scraps, dirt, or cat litter before putting it in the trash. Follow these instructions at home: Activity Do exercises as told by your health care provider. Avoid activities that make your pain worse. Return to your normal activities as told by your health care provider. Ask your health care provider what activities are safe for you. General instructions You may need to take these actions to prevent or treat constipation: Drink enough fluid to keep your urine pale yellow. Take over-the-counter or prescription medicines. Eat foods that are high in fiber, such as beans, whole grains, and fresh fruits and vegetables. Limit foods that are high in fat and processed sugars, such as fried or sweet foods. Keep all follow-up visits. This is important. Where to find support If you have been taking opioids for a long time, you may benefit from receiving support for quitting from a local support group or counselor. Ask your health care provider for a referral to these resources in your area.  Where to find more information Centers for Disease Control and Prevention (CDC): FootballExhibition.com.br [place] Food and Drug Administration (FDA): PumpkinSearch.com.ee Get help right away if: You may have taken too much of an opioid (overdosed). Common symptoms of an overdose: Your breathing is slower or more shallow than normal. You have a very slow heartbeat (pulse). You have slurred speech. You have nausea and vomiting. Your pupils become very small. You have other potential symptoms: You are very confused. You faint or feel like you will faint. You have cold, clammy skin. You have blue lips or fingernails. You have thoughts of harming yourself or harming others. These symptoms may represent a serious problem that is an emergency. Do not wait to see if the symptoms will go away. Get medical help right away. Call your local emergency services (911 in the  [place] Do not drive yourself to the hospital.  If you ever feel like you may hurt yourself or others, or have thoughts about taking your own life, get help right away. Go to your nearest emergency department or: Call your local emergency services (911 in the [place] Call the [facility] (325-722-4167 in the [place] Call a suicide crisis helpline, such as the National Suicide Prevention Lifeline at 573-419-7857 or 988 in the [place] This is open 24 hours a day in the [place] Text the Crisis Text Line at 609-740-1901 (in the [place] Summary Opioid medicines can help you manage moderate to severe pain for a short period of time. A pain treatment plan is an agreement between you and your health care provider. Discuss the goals of your treatment, including how much pain you might expect to have and how you will manage the pain. If you think that you or someone else may have taken too much of an opioid, get medical help right away. This information is not intended to replace advice given to you by your health care provider. Make sure you discuss any questions you have with your health care provider. Document Revised: 12/05/2020 Document Reviewed: 08/22/2020 Elsevier Patient Education  2024 Elsevier Inc.  Advanced directives: Please bring a copy of your health care power of attorney and living will to the office to be added to your chart at your convenience.   Conditions/risks identified: None  Next appointment: Follow up in one year for your annual wellness visit    Preventive Care 65 Years and Older, Female Preventive care refers to lifestyle choices and visits with your health care provider that can promote health and wellness. What does preventive care include? A yearly physical exam. This is also called an annual well check. Dental exams once or twice a year. Routine eye exams. Ask your health care provider how often you should have your eyes checked. Personal lifestyle choices,  including: Daily care of your teeth and gums. Regular physical activity. Eating a healthy diet. Avoiding tobacco and drug use. Limiting alcohol use. Practicing safe sex. Taking low-dose aspirin every day. Taking vitamin and mineral supplements as recommended by your health care provider. What happens during an annual well check? The services and screenings done by your health care provider during your annual well check will depend on your age, overall health, lifestyle risk factors, and family history of disease. Counseling  Your health care provider may ask you questions about your: Alcohol use. Tobacco use. Drug use. Emotional well-being. Home and relationship well-being. Sexual activity. Eating habits. History of falls. Memory and ability to understand (cognition). Work and work Astronomer. Reproductive health. Screening  You may have the following tests or measurements: Height, weight, and BMI. Blood pressure. Lipid and cholesterol levels. These may be checked every 5 years, or more frequently if you are over 60 years old. Skin check. Lung cancer screening. You may have this screening every year starting at age 85 if you have a 30-pack-year history of smoking and currently smoke or have quit within the past 15 years. Fecal occult blood test (FOBT) of the stool. You may have this test every year starting at age 57. Flexible sigmoidoscopy or colonoscopy. You may have a sigmoidoscopy every 5 years or a colonoscopy every 10 years starting at age 81. Hepatitis C blood test. Hepatitis B blood test. Sexually transmitted disease (STD) testing. Diabetes screening. This is done by checking your blood sugar (glucose) after you have not eaten for a while (fasting). You may have this done every 1-3 years. Bone density scan. This is done to screen for osteoporosis. You may have this done starting at age 51. Mammogram. This may be done every 1-2 years. Talk to your health care provider  about how often you should have regular mammograms. Talk with your health care provider about your test results, treatment options, and if necessary, the need for more tests. Vaccines  Your health care provider may recommend certain vaccines, such as: Influenza vaccine. This is recommended every year. Tetanus, diphtheria, and acellular pertussis (Tdap, Td) vaccine. You may need a Td booster every 10 years. Zoster vaccine. You may need this after age 58. Pneumococcal 13-valent conjugate (PCV13) vaccine. One dose is recommended after age 73. Pneumococcal polysaccharide (PPSV23) vaccine. One dose is recommended after age 46. Talk to your health care provider about which screenings and vaccines you need and how often you need them. This information is not intended to replace advice given to you by your health care provider. Make sure you discuss any questions you have with your health care provider. Document Released: 06/08/2015 Document Revised: 01/30/2016 Document Reviewed: 03/13/2015 Elsevier Interactive Patient Education  2017 [employer].  Fall Prevention in the Home Falls can cause injuries. They can happen to people of all ages. There are many things you can do to make your home safe and to help prevent falls. What can I do on the outside of my home? Regularly fix the edges of walkways and driveways and fix any cracks. Remove anything that might make you trip as you walk through a door, such as a raised step or threshold. Trim any bushes or trees on the path to your home. Use bright outdoor lighting. Clear any walking paths of anything that might make someone trip, such as rocks or tools. Regularly check to see if handrails are loose or broken. Make sure that both sides of any steps have handrails. Any raised decks and porches should have guardrails on the edges. Have any leaves, snow, or ice cleared regularly. Use sand or salt on walking paths during winter. Clean up any spills in  your garage right away. This includes oil or grease spills. What can I do in the bathroom? Use night lights. Install grab bars by the toilet and in the tub and shower. Do not use towel bars as grab bars. Use non-skid mats or decals in the tub or shower. If you need to sit down in the shower, use a plastic, non-slip stool. Keep the floor dry. Clean up any water that spills on the floor as soon as it happens. Remove soap buildup in the tub or shower regularly.  Attach bath mats securely with double-sided non-slip rug tape. Do not have throw rugs and other things on the floor that can make you trip. What can I do in the bedroom? Use night lights. Make sure that you have a light by your bed that is easy to reach. Do not use any sheets or blankets that are too big for your bed. They should not hang down onto the floor. Have a firm chair that has side arms. You can use this for support while you get dressed. Do not have throw rugs and other things on the floor that can make you trip. What can I do in the kitchen? Clean up any spills right away. Avoid walking on wet floors. Keep items that you use a lot in easy-to-reach places. If you need to reach something above you, use a strong step stool that has a grab bar. Keep electrical cords out of the way. Do not use floor polish or wax that makes floors slippery. If you must use wax, use non-skid floor wax. Do not have throw rugs and other things on the floor that can make you trip. What can I do with my stairs? Do not leave any items on the stairs. Make sure that there are handrails on both sides of the stairs and use them. Fix handrails that are broken or loose. Make sure that handrails are as long as the stairways. Check any carpeting to make sure that it is firmly attached to the stairs. Fix any carpet that is loose or worn. Avoid having throw rugs at the top or bottom of the stairs. If you do have throw rugs, attach them to the floor with carpet  tape. Make sure that you have a light switch at the top of the stairs and the bottom of the stairs. If you do not have them, ask someone to add them for you. What else can I do to help prevent falls? Wear shoes that: Do not have high heels. Have rubber bottoms. Are comfortable and fit you well. Are closed at the toe. Do not wear sandals. If you use a stepladder: Make sure that it is fully opened. Do not climb a closed stepladder. Make sure that both sides of the stepladder are locked into place. Ask someone to hold it for you, if possible. Clearly mark and make sure that you can see: Any grab bars or handrails. First and last steps. Where the edge of each step is. Use tools that help you move around (mobility aids) if they are needed. These include: Canes. Walkers. Scooters. Crutches. Turn on the lights when you go into a dark area. Replace any light bulbs as soon as they burn out. Set up your furniture so you have a clear path. Avoid moving your furniture around. If any of your floors are uneven, fix them. If there are any pets around you, be aware of where they are. Review your medicines with your doctor. Some medicines can make you feel dizzy. This can increase your chance of falling. Ask your doctor what other things that you can do to help prevent falls. This information is not intended to replace advice given to you by your health care provider. Make sure you discuss any questions you have with your health care provider. Document Released: 03/08/2009 Document Revised: 10/18/2015 Document Reviewed: 06/16/2014 Elsevier Interactive Patient Education  2017 [employer].

## 2022-12-02 MED ORDER — NITROFURANTOIN MONOHYD MACRO 100 MG PO CAPS
100.0000 mg | ORAL_CAPSULE | Freq: Two times a day (BID) | ORAL | 0 refills | Status: AC
Start: 2022-12-02 — End: ?

## 2022-12-02 NOTE — Telephone Encounter (Signed)
I sent in another 7 day supply  

## 2022-12-03 NOTE — Telephone Encounter (Signed)
Noted  

## 2022-12-03 NOTE — Telephone Encounter (Signed)
Pt called to F/U on this request.  Pt was informed MD sent in another 7 days.  Pt asked why did no one call her back to let her know?  I apologized to Pt, on our behalf.

## 2022-12-30 DIAGNOSIS — M170 Bilateral primary osteoarthritis of knee: Secondary | ICD-10-CM | POA: Diagnosis not present

## 2023-01-12 DIAGNOSIS — M170 Bilateral primary osteoarthritis of knee: Secondary | ICD-10-CM | POA: Diagnosis not present

## 2023-01-22 ENCOUNTER — Ambulatory Visit (INDEPENDENT_AMBULATORY_CARE_PROVIDER_SITE_OTHER): Payer: Medicare HMO | Admitting: Family Medicine

## 2023-01-22 ENCOUNTER — Encounter: Payer: Self-pay | Admitting: Family Medicine

## 2023-01-22 VITALS — BP 136/90 | HR 115 | Temp 99.2°F | Ht 67.0 in

## 2023-01-22 DIAGNOSIS — R5383 Other fatigue: Secondary | ICD-10-CM

## 2023-01-22 DIAGNOSIS — R195 Other fecal abnormalities: Secondary | ICD-10-CM | POA: Diagnosis not present

## 2023-01-22 DIAGNOSIS — R110 Nausea: Secondary | ICD-10-CM

## 2023-01-22 DIAGNOSIS — N3001 Acute cystitis with hematuria: Secondary | ICD-10-CM | POA: Diagnosis not present

## 2023-01-22 DIAGNOSIS — R051 Acute cough: Secondary | ICD-10-CM | POA: Diagnosis not present

## 2023-01-22 DIAGNOSIS — R34 Anuria and oliguria: Secondary | ICD-10-CM

## 2023-01-22 LAB — POCT URINALYSIS DIPSTICK
Bilirubin, UA: NEGATIVE
Glucose, UA: NEGATIVE
Ketones, UA: NEGATIVE
Nitrite, UA: NEGATIVE
Protein, UA: POSITIVE — AB
Spec Grav, UA: 1.025 (ref 1.010–1.025)
Urobilinogen, UA: 0.2 E.U./dL
pH, UA: 6 (ref 5.0–8.0)

## 2023-01-22 LAB — POC COVID19 BINAXNOW: SARS Coronavirus 2 Ag: NEGATIVE

## 2023-01-22 MED ORDER — CEPHALEXIN 500 MG PO CAPS: 500.0000 mg | ORAL_CAPSULE | Freq: Two times a day (BID) | ORAL | 0 refills | Status: AC

## 2023-01-22 NOTE — Progress Notes (Signed)
Established Patient Office Visit   Subjective  Patient ID: Amy [name], female    DOB: [birth year]  Age: 89 y.o. MRN: [id number]  Chief Complaint  Patient presents with   Diarrhea     Diarrhea, Vomit, weakness, tested for Covid yesterday and is Neg     Patient is a 89 year old female followed with Dr. Clent Ridges and seen for acute concerns.  Patient states she thinks she may have a UTI.  Endorses nausea, vomiting, loose stools, fatigue, cough x 2-3 days.  Also decreased appetite.  Patient states she had a negative COVID test yesterday.  Denies sick contacts.  Patient tried taking Pepto-Bismol and drinking ginger ale.   Of note had a UTI at the beginning of July that was resistant to several ABX.    Past Medical History:  Diagnosis Date   ABSCESS 12/18/2009   DIABETES MELLITUS, TYPE II 05/22/2009   Diverticulosis    Hemorrhoids    HYPERGLYCEMIA 05/22/2009   HYPERTENSION 04/13/2007   HYPOTHYROIDISM 04/13/2007   NEOPLASM, SKIN, UNCERTAIN BEHAVIOR 01/11/2010   Stroke (HCC)    Tubular adenoma of colon 02/2001   Past Surgical History:  Procedure Laterality Date   ABDOMINAL HYSTERECTOMY     APPENDECTOMY     CHOLECYSTECTOMY     COLONOSCOPY  04-05-04   per Dr. Arlyce Dice, no polyps    OOPHORECTOMY     THYROIDECTOMY     secondary to nodule, no cancer   TONSILLECTOMY AND ADENOIDECTOMY     Social History   Tobacco Use   Smoking status: Never   Smokeless tobacco: Never  Vaping Use   Vaping status: Never Used  Substance Use Topics   Alcohol use: No    Alcohol/week: 0.0 standard drinks of alcohol   Drug use: No   Family History  Problem Relation Age of Onset   Stroke Father    Hypertension Mother    Stroke Mother    Huntington's disease Daughter 66   Colon cancer Neg Hx    Allergies  Allergen Reactions   Ativan [Lorazepam] Other (See Comments)    Caused delirium, confusion, disorientation, slightly increased agitation.   Diclofenac Sodium Nausea And Vomiting   Feldene  [Piroxicam] Other (See Comments)    Dizzy   Latex Rash   Tape Rash      ROS Negative unless stated above    Objective:     BP (!) 136/90 (BP Location: Left Arm, Patient Position: Sitting, Cuff Size: Large)   Pulse (!) 115   Temp 99.2 F (37.3 C) (Oral)   Ht 5\' 7"  (1.702 m)   SpO2 96%   BMI 29.76 kg/m    Physical Exam Constitutional:      General: She is not in acute distress.    Appearance: Normal appearance.  HENT:     Head: Normocephalic and atraumatic.     Nose: Nose normal.     Mouth/Throat:     Mouth: Mucous membranes are moist.  Cardiovascular:     Rate and Rhythm: Regular rhythm.     Heart sounds: Normal heart sounds. No murmur heard.    No gallop.  Pulmonary:     Effort: Pulmonary effort is normal. No respiratory distress.     Breath sounds: Normal breath sounds. No wheezing, rhonchi or rales.  Skin:    General: Skin is warm and dry.  Neurological:     Mental Status: She is alert and oriented to person, place, and time.      Results for  orders placed or performed in visit on 01/22/23  POC COVID-19 BinaxNow  Result Value Ref Range   SARS Coronavirus 2 Ag Negative Negative  POCT urinalysis dipstick  Result Value Ref Range   Color, UA yellow    Clarity, UA cloudy    Glucose, UA Negative Negative   Bilirubin, UA neg    Ketones, UA neg    Spec Grav, UA 1.025 1.010 - 1.025   Blood, UA 1+    pH, UA 6.0 5.0 - 8.0   Protein, UA Positive (A) Negative   Urobilinogen, UA 0.2 0.2 or 1.0 E.U./dL   Nitrite, UA neg    Leukocytes, UA Large (3+) (A) Negative   Appearance     Odor matador       Assessment & Plan:  Acute cystitis with hematuria -     Cephalexin; Take 1 capsule (500 mg total) by mouth 2 (two) times daily for 7 days.  Dispense: 14 capsule; Refill: 0  Decreased urine output -     POCT urinalysis dipstick -     Urine Culture  Fatigue, unspecified type -     POC COVID-19 BinaxNow -     POCT urinalysis dipstick -     Urine  Culture  Acute cough  Nausea without vomiting  Loose stools  Acute symptoms x 2 to 3 days concerning for viral etiology.  COVID-19 testing negative in clinic.  Also consider UTI.  POC UA with leuks,RBCs.  Obtain culture.  Start Keflex.  Given precautions.  Return if symptoms worsen or fail to improve.   Deeann Saint, MD

## 2023-01-22 NOTE — Patient Instructions (Signed)
A prescription for Keflex and antibiotic was sent to your pharmacy.  You can start taking this to help treat your symptoms.  We will obtain a urine culture to see which bacteria grows and if we need to adjust your antibiotics.  In the meantime is important that you increase your intake of fluids especially water.

## 2023-01-24 LAB — URINE CULTURE
MICRO NUMBER:: 15399982
SPECIMEN QUALITY:: ADEQUATE

## 2023-02-25 ENCOUNTER — Ambulatory Visit (INDEPENDENT_AMBULATORY_CARE_PROVIDER_SITE_OTHER): Payer: Medicare HMO | Admitting: Family Medicine

## 2023-02-25 ENCOUNTER — Encounter: Payer: Self-pay | Admitting: Family Medicine

## 2023-02-25 VITALS — BP 132/80 | HR 85 | Temp 98.6°F

## 2023-02-25 DIAGNOSIS — G8929 Other chronic pain: Secondary | ICD-10-CM | POA: Diagnosis not present

## 2023-02-25 DIAGNOSIS — M5442 Lumbago with sciatica, left side: Secondary | ICD-10-CM | POA: Diagnosis not present

## 2023-02-25 DIAGNOSIS — M5441 Lumbago with sciatica, right side: Secondary | ICD-10-CM

## 2023-02-25 DIAGNOSIS — R8290 Unspecified abnormal findings in urine: Secondary | ICD-10-CM

## 2023-02-25 MED ORDER — MELOXICAM 15 MG PO TABS: 15.0000 mg | ORAL_TABLET | Freq: Every day | ORAL | 3 refills | Status: AC

## 2023-02-25 NOTE — Progress Notes (Signed)
   Subjective:    Patient ID: Amy [name], female    DOB: [birth year], 89 y.o.   MRN: [id number]  HPI Here for one week of low back pain that radiates to both legs. No numbness or weakness. No recent trauma. She had Xrays here of the lumbar spine in 2021 showing severe arthritis changes. She occasionally takes some Tylenol or Ibuprofen with mixed results. She also asks [place] to check her urine. She says it has been a darker yellow than usual this week. No other symptoms.    Review of Systems  Constitutional: Negative.   Respiratory: Negative.    Cardiovascular: Negative.   Gastrointestinal: Negative.   Genitourinary: Negative.   Musculoskeletal:  Positive for back pain.       Objective:   Physical Exam Constitutional:      Appearance: Normal appearance. She is not ill-appearing.  Cardiovascular:     Rate and Rhythm: Normal rate and regular rhythm.     Pulses: Normal pulses.     Heart sounds: Normal heart sounds.  Pulmonary:     Effort: Pulmonary effort is normal.     Breath sounds: Normal breath sounds.  Abdominal:     Tenderness: There is no right CVA tenderness or left CVA tenderness.  Musculoskeletal:     Comments: She is very tender over both sides if her lower back   Neurological:     Mental Status: She is alert.           Assessment & Plan:  Low back pain, she will try Meloxicam 15 mg daily. For the urine discoloration, we will culture her urine. Gershon Crane, MD

## 2023-02-27 LAB — URINE CULTURE
MICRO NUMBER:: 15542427
SPECIMEN QUALITY:: ADEQUATE

## 2023-04-02 ENCOUNTER — Other Ambulatory Visit: Payer: Self-pay | Admitting: Family Medicine

## 2023-05-10 ENCOUNTER — Other Ambulatory Visit: Payer: Self-pay | Admitting: Family Medicine

## 2023-06-12 ENCOUNTER — Ambulatory Visit (INDEPENDENT_AMBULATORY_CARE_PROVIDER_SITE_OTHER): Payer: Medicare HMO | Admitting: Family Medicine

## 2023-06-12 ENCOUNTER — Encounter: Payer: Self-pay | Admitting: Family Medicine

## 2023-06-12 VITALS — BP 136/76 | HR 66 | Temp 97.9°F | Wt 190.0 lb

## 2023-06-12 DIAGNOSIS — M545 Low back pain, unspecified: Secondary | ICD-10-CM | POA: Diagnosis not present

## 2023-06-12 DIAGNOSIS — G8929 Other chronic pain: Secondary | ICD-10-CM | POA: Insufficient documentation

## 2023-06-12 DIAGNOSIS — M25561 Pain in right knee: Secondary | ICD-10-CM

## 2023-06-12 DIAGNOSIS — M25562 Pain in left knee: Secondary | ICD-10-CM

## 2023-06-12 MED ORDER — TRAMADOL HCL 50 MG PO TABS: ORAL_TABLET | ORAL | 5 refills | Status: DC

## 2023-06-12 NOTE — Progress Notes (Signed)
   Subjective:    Patient ID: Amy [name], female    DOB: 01-Dec-1929, 89 y.o.   MRN: [id number]  HPI Here asking for refills on Tramadol. She deals with low back pain and pain in t=bith legs every day. She takes Tylenol as needed. I started her on Meloxicam last October, but it sounds like she is not taking it now.    Review of Systems  Constitutional: Negative.   Respiratory: Negative.    Cardiovascular: Negative.   Musculoskeletal:  Positive for arthralgias and back pain.       Objective:   Physical Exam Constitutional:      Comments: Walks with a walker   Cardiovascular:     Rate and Rhythm: Normal rate and regular rhythm.     Pulses: Normal pulses.     Heart sounds: Normal heart sounds.  Pulmonary:     Effort: Pulmonary effort is normal.     Breath sounds: Normal breath sounds.  Neurological:     Mental Status: She is alert.           Assessment & Plan:  Low back and leg pain. We refilled the Tramadol, but I reminded her to take the Meloxicam daily as well.  Gershon Crane, MD

## 2023-07-03 ENCOUNTER — Other Ambulatory Visit: Payer: Self-pay | Admitting: Family Medicine

## 2023-07-03 DIAGNOSIS — E119 Type 2 diabetes mellitus without complications: Secondary | ICD-10-CM

## 2023-07-03 DIAGNOSIS — I10 Essential (primary) hypertension: Secondary | ICD-10-CM

## 2023-07-08 ENCOUNTER — Other Ambulatory Visit: Payer: Self-pay | Admitting: Family Medicine

## 2023-07-08 DIAGNOSIS — E119 Type 2 diabetes mellitus without complications: Secondary | ICD-10-CM

## 2023-07-08 DIAGNOSIS — I10 Essential (primary) hypertension: Secondary | ICD-10-CM

## 2023-07-08 NOTE — Telephone Encounter (Signed)
Last Fill: Atorvastatin: 06/24/22     Oxybutynin: 06/24/22     Gabapentin: Unknown     Januvia: 06/24/22     Pepcid: 06/24/22     Prilosec: 06/24/22     Zoloft:06/24/22     Norvasc: 06/24/22     Synthroid: 06/27/22  Last OV: 06/12/23 Next OV: 12/04/23 AWV  Routing to provider for review/authorization.

## 2023-07-08 NOTE — Telephone Encounter (Signed)
Copied from CRM [id number]. Topic: Clinical - Medication Refill >> Jul 08, 2023  5:02 PM Denese Killings wrote: Most Recent Primary Care Visit:  Provider: Gershon Crane A  Department: [facility]  Visit Type: OFFICE VISIT  Date: 06/12/2023  Medication: atorvastatin (LIPITOR) 40 MG tablet  oxybutynin (DITROPAN XL) 10 MG 24 hr tablet gabapentin (NEURONTIN) 300 MG capsule sitaGLIPtin (JANUVIA) 100 MG tablet famotidine (PEPCID) 40 MG tablet omeprazole (PRILOSEC) 40 MG capsule sertraline (ZOLOFT) 50 MG tablet amLODipine (NORVASC) 5 MG tablet levothyroxine (SYNTHROID) 200 MCG tablet  Has the patient contacted their pharmacy? Yes (Agent: If no, request that the patient contact the pharmacy for the refill. If patient does not wish to contact the pharmacy document the reason why and proceed with request.) (Agent: If yes, when and what did the pharmacy advise?) pharmacy called in rx refill fax failed system down  Is this the correct pharmacy for this prescription? Yes If no, delete pharmacy and type the correct one.  This is the patient's preferred pharmacy:  [facility] [place], [place] - [address] [facility] [address] [place] [address] Phone: [phone] Fax: [phone]  Has the prescription been filled recently? Yes  Is the patient out of the medication? No  Has the patient been seen for an appointment in the last year OR does the patient have an upcoming appointment? Yes  Can we respond through MyChart? No  Agent: Please be advised that Rx refills may take up to 3 business days. We ask that you follow-up with your pharmacy.

## 2023-07-10 ENCOUNTER — Telehealth: Payer: Self-pay

## 2023-07-10 NOTE — Telephone Encounter (Signed)
Copied from CRM [id number]. Topic: Clinical - Medication Question >> Jul 10, 2023  2:24 PM Presley Raddle C wrote: Reason for CRM: Toniann Fail from [facility] called and stated that they have requested refills for the patient 6 times since 2/7 and has not heard back from the clinic. Please call and advise.

## 2023-07-14 ENCOUNTER — Other Ambulatory Visit (HOSPITAL_COMMUNITY): Payer: Self-pay

## 2023-07-14 MED ORDER — GABAPENTIN 300 MG PO CAPS: 300.0000 mg | ORAL_CAPSULE | Freq: Two times a day (BID) | ORAL | 1 refills | Status: DC

## 2023-07-14 MED ORDER — SERTRALINE HCL 50 MG PO TABS: 50.0000 mg | ORAL_TABLET | Freq: Every day | ORAL | 11 refills | Status: DC

## 2023-07-14 MED ORDER — OMEPRAZOLE 40 MG PO CPDR: 40.0000 mg | DELAYED_RELEASE_CAPSULE | Freq: Every morning | ORAL | 11 refills | Status: DC

## 2023-07-14 MED ORDER — ATORVASTATIN CALCIUM 40 MG PO TABS: 40.0000 mg | ORAL_TABLET | Freq: Every day | ORAL | 11 refills | Status: DC

## 2023-07-14 MED ORDER — FAMOTIDINE 40 MG PO TABS: 40.0000 mg | ORAL_TABLET | Freq: Every day | ORAL | 11 refills | Status: DC

## 2023-07-14 MED ORDER — SITAGLIPTIN PHOSPHATE 100 MG PO TABS: 100.0000 mg | ORAL_TABLET | Freq: Every day | ORAL | 11 refills | Status: DC

## 2023-07-14 MED ORDER — LEVOTHYROXINE SODIUM 200 MCG PO TABS: 200.0000 ug | ORAL_TABLET | Freq: Every day | ORAL | 3 refills | Status: DC

## 2023-07-14 MED ORDER — AMLODIPINE BESYLATE 5 MG PO TABS: 5.0000 mg | ORAL_TABLET | Freq: Two times a day (BID) | ORAL | 11 refills | Status: DC

## 2023-07-14 MED ORDER — OXYBUTYNIN CHLORIDE ER 10 MG PO TB24: 10.0000 mg | ORAL_TABLET | Freq: Every day | ORAL | 11 refills | Status: DC

## 2023-07-16 ENCOUNTER — Other Ambulatory Visit (HOSPITAL_COMMUNITY): Payer: Self-pay

## 2023-07-16 ENCOUNTER — Telehealth: Payer: Self-pay

## 2023-07-16 NOTE — Telephone Encounter (Signed)
 Pharmacy Patient Advocate Encounter   Received notification from Onbase that prior authorization for Sertraline 50mg  tabs is required/requested.   Insurance verification completed.   The patient is insured through [employer] .   Per test claim: Refill too soon. PA is not needed at this time. Medication was filled 07/14/2023. Next eligible fill date is 08/04/2023.

## 2023-07-16 NOTE — Telephone Encounter (Signed)
 Rx already sent in to pt pharmacy

## 2023-07-17 ENCOUNTER — Other Ambulatory Visit (HOSPITAL_COMMUNITY): Payer: Self-pay

## 2023-08-28 ENCOUNTER — Ambulatory Visit (INDEPENDENT_AMBULATORY_CARE_PROVIDER_SITE_OTHER): Admitting: Family Medicine

## 2023-08-28 ENCOUNTER — Encounter: Payer: Self-pay | Admitting: Family Medicine

## 2023-08-28 ENCOUNTER — Telehealth: Payer: Self-pay | Admitting: Gastroenterology

## 2023-08-28 VITALS — BP 104/60 | HR 85 | Temp 98.0°F | Wt 190.0 lb

## 2023-08-28 DIAGNOSIS — K921 Melena: Secondary | ICD-10-CM | POA: Diagnosis not present

## 2023-08-28 DIAGNOSIS — R1013 Epigastric pain: Secondary | ICD-10-CM

## 2023-08-28 DIAGNOSIS — L989 Disorder of the skin and subcutaneous tissue, unspecified: Secondary | ICD-10-CM

## 2023-08-28 LAB — HEPATIC FUNCTION PANEL
ALT: 9 U/L (ref 0–35)
AST: 11 U/L (ref 0–37)
Albumin: 4 g/dL (ref 3.5–5.2)
Alkaline Phosphatase: 84 U/L (ref 39–117)
Bilirubin, Direct: 0.1 mg/dL (ref 0.0–0.3)
Total Bilirubin: 0.8 mg/dL (ref 0.2–1.2)
Total Protein: 7.3 g/dL (ref 6.0–8.3)

## 2023-08-28 LAB — CBC WITH DIFFERENTIAL/PLATELET
Basophils Absolute: 0 10*3/uL (ref 0.0–0.1)
Basophils Relative: 0.2 % (ref 0.0–3.0)
Eosinophils Absolute: 0.1 10*3/uL (ref 0.0–0.7)
Eosinophils Relative: 1.2 % (ref 0.0–5.0)
HCT: 32.9 % — ABNORMAL LOW (ref 36.0–46.0)
Hemoglobin: 10.7 g/dL — ABNORMAL LOW (ref 12.0–15.0)
Lymphocytes Relative: 23.2 % (ref 12.0–46.0)
Lymphs Abs: 1.7 10*3/uL (ref 0.7–4.0)
MCHC: 32.6 g/dL (ref 30.0–36.0)
MCV: 90.3 fl (ref 78.0–100.0)
Monocytes Absolute: 0.7 10*3/uL (ref 0.1–1.0)
Monocytes Relative: 8.9 % (ref 3.0–12.0)
Neutro Abs: 4.9 10*3/uL (ref 1.4–7.7)
Neutrophils Relative %: 66.5 % (ref 43.0–77.0)
Platelets: 192 10*3/uL (ref 150.0–400.0)
RBC: 3.64 Mil/uL — ABNORMAL LOW (ref 3.87–5.11)
RDW: 14.5 % (ref 11.5–15.5)
WBC: 7.4 10*3/uL (ref 4.0–10.5)

## 2023-08-28 LAB — BASIC METABOLIC PANEL WITH GFR
BUN: 23 mg/dL (ref 6–23)
CO2: 27 meq/L (ref 19–32)
Calcium: 9.2 mg/dL (ref 8.4–10.5)
Chloride: 103 meq/L (ref 96–112)
Creatinine, Ser: 1.22 mg/dL — ABNORMAL HIGH (ref 0.40–1.20)
GFR: 38.77 mL/min — ABNORMAL LOW (ref >60.00)
Glucose, Bld: 168 mg/dL — ABNORMAL HIGH (ref 70–99)
Potassium: 4.1 meq/L (ref 3.5–5.1)
Sodium: 138 meq/L (ref 135–145)

## 2023-08-28 LAB — LIPASE: Lipase: 36 U/L (ref 11.0–59.0)

## 2023-08-28 MED ORDER — SUCRALFATE 1 G PO TABS: 1.0000 g | ORAL_TABLET | Freq: Four times a day (QID) | ORAL | 0 refills | Status: DC

## 2023-08-28 NOTE — Telephone Encounter (Signed)
 Good afternoon Dr. Meridee Score,   We received an urgent referral for this patient to be seen for epigastric pain and also for passing black stools. Patient was last seen by Dr. Russella Dar for an endoscopy in 2017. I have scheduled patient for 5/29 at 2:10 PM, being that it is our soonest appointment. Would you advise on scheduling? Does patient need to be seen sooner or is that appointment okay?  Thank you.

## 2023-08-28 NOTE — Progress Notes (Signed)
   Subjective:    Patient ID: Amy [name], female    DOB: 1929-09-09, 89 y.o.   MRN: [id number]  HPI Here for several issues. First about 3 weeks ago she began to have upper abdominal pains off and on with occasional nausea. She has not vomited. Sometimes eating makes the pain worse. Her BM's are typically formed and brown, but twice in the past 2 weeks she has passed loose stools that were black in color. No bright red blood. She mentions having to burp more than usual, but she has no trouble swallowing. She has been taking Omeprazole every morning and Famotidine every evening for some time. Her appetite is normal, but she feels more tired than usual. In addition she asks me to check a spot on her left cheek that appeared about 6 months ago.    Review of Systems  Constitutional:  Positive for fatigue. Negative for fever.  Respiratory: Negative.    Cardiovascular: Negative.   Gastrointestinal:  Positive for abdominal pain, blood in stool and nausea. Negative for abdominal distention, constipation, diarrhea, rectal pain and vomiting.  Genitourinary: Negative.        Objective:   Physical Exam Constitutional:      Appearance: Normal appearance.  Cardiovascular:     Rate and Rhythm: Normal rate and regular rhythm.     Pulses: Normal pulses.     Heart sounds: Normal heart sounds.  Pulmonary:     Effort: Pulmonary effort is normal.     Breath sounds: Normal breath sounds.  Abdominal:     General: Abdomen is flat. Bowel sounds are normal. There is no distension.     Palpations: Abdomen is soft. There is no mass.     Tenderness: There is no right CVA tenderness, left CVA tenderness, guarding or rebound.     Hernia: No hernia is present.     Comments: She is tender in the epigastrium   Skin:    Comments: There is a 4 mm raised crusty lesion on the left cheek near the nose   Neurological:     Mental Status: She is alert.           Assessment & Plan:  She is having upper  abdominal pain with melena, and a bleeding ulcer is a possible etiology. She will stay on the Omeprazole and Famotidine, and we will add Sucralfate 1 gram QID. Refer urgently to GI. We will check CBC, BMET. Liver enzymes ,and lipase today. She also has a facial lesion that appears to be a basal call cancer, so we will refer her to Dermatology.  Gershon Crane, MD

## 2023-08-29 ENCOUNTER — Other Ambulatory Visit: Payer: Self-pay | Admitting: Family Medicine

## 2023-08-30 NOTE — Telephone Encounter (Signed)
 I have reviewed the patient's blood counts from her PCP evaluation last week.  She does have evidence of anemia.  Please have her do additional labs this week on Tuesday or Wednesday to include repeat CBC/iron/TIBC/ferritin/B12/folate/reticulocyte count. Please add this patient on for EGD on 4/14 hospital-based outpatient procedure. Thanks. GM

## 2023-08-31 ENCOUNTER — Encounter: Payer: Self-pay | Admitting: Family Medicine

## 2023-08-31 NOTE — Telephone Encounter (Signed)
 Inbound call from patient requesting a call regarding update for previous notes. Please advise, thank you.

## 2023-08-31 NOTE — Telephone Encounter (Signed)
 Labs have been entered  EGD has been set up for 09/07/23 at 150 pm at [facility] with GM

## 2023-08-31 NOTE — Telephone Encounter (Signed)
 EGD scheduled, pt instructed and medications reviewed.  Patient instructions mailed to home.  Patient to call with any questions or concerns.  She will come in for labs as ordered

## 2023-08-31 NOTE — Telephone Encounter (Signed)
See alternate phone note  

## 2023-08-31 NOTE — Addendum Note (Signed)
 Addended by: Loretha Stapler on: 08/31/2023 12:20 PM   Modules accepted: Orders

## 2023-09-01 ENCOUNTER — Other Ambulatory Visit (INDEPENDENT_AMBULATORY_CARE_PROVIDER_SITE_OTHER)

## 2023-09-01 ENCOUNTER — Other Ambulatory Visit: Payer: Self-pay

## 2023-09-01 ENCOUNTER — Encounter (HOSPITAL_COMMUNITY): Payer: Self-pay | Admitting: Gastroenterology

## 2023-09-01 DIAGNOSIS — R1013 Epigastric pain: Secondary | ICD-10-CM

## 2023-09-01 DIAGNOSIS — K921 Melena: Secondary | ICD-10-CM

## 2023-09-01 LAB — CBC WITH DIFFERENTIAL/PLATELET
Basophils Absolute: 0 10*3/uL (ref 0.0–0.1)
Basophils Relative: 0.3 % (ref 0.0–3.0)
Eosinophils Absolute: 0.1 10*3/uL (ref 0.0–0.7)
Eosinophils Relative: 1.6 % (ref 0.0–5.0)
HCT: 33.5 % — ABNORMAL LOW (ref 36.0–46.0)
Hemoglobin: 10.9 g/dL — ABNORMAL LOW (ref 12.0–15.0)
Lymphocytes Relative: 32.3 % (ref 12.0–46.0)
Lymphs Abs: 2.5 10*3/uL (ref 0.7–4.0)
MCHC: 32.6 g/dL (ref 30.0–36.0)
MCV: 90.9 fl (ref 78.0–100.0)
Monocytes Absolute: 0.8 10*3/uL (ref 0.1–1.0)
Monocytes Relative: 10 % (ref 3.0–12.0)
Neutro Abs: 4.4 10*3/uL (ref 1.4–7.7)
Neutrophils Relative %: 55.8 % (ref 43.0–77.0)
Platelets: 223 10*3/uL (ref 150.0–400.0)
RBC: 3.69 Mil/uL — ABNORMAL LOW (ref 3.87–5.11)
RDW: 14.7 % (ref 11.5–15.5)
WBC: 7.8 10*3/uL (ref 4.0–10.5)

## 2023-09-01 LAB — IBC + FERRITIN
Ferritin: 9.7 ng/mL — ABNORMAL LOW (ref 10.0–291.0)
Iron: 64 ug/dL (ref 42–145)
Saturation Ratios: 16.5 % — ABNORMAL LOW (ref 20.0–50.0)
TIBC: 387.8 ug/dL (ref 250.0–450.0)
Transferrin: 277 mg/dL (ref 212.0–360.0)

## 2023-09-01 LAB — RETICULOCYTES
ABS Retic: 36500 {cells}/uL (ref 20000–80000)
Retic Ct Pct: 1 %

## 2023-09-02 LAB — VITAMIN B12: Vitamin B-12: 367 pg/mL (ref 211–911)

## 2023-09-02 LAB — FOLATE: Folate: 8.7 ng/mL (ref >5.9)

## 2023-09-03 ENCOUNTER — Other Ambulatory Visit: Payer: Self-pay | Admitting: Family Medicine

## 2023-09-07 ENCOUNTER — Other Ambulatory Visit: Payer: Self-pay | Admitting: Family Medicine

## 2023-09-07 ENCOUNTER — Encounter (HOSPITAL_COMMUNITY): Payer: Self-pay | Admitting: Gastroenterology

## 2023-09-07 ENCOUNTER — Encounter (HOSPITAL_COMMUNITY)
Admission: RE | Disposition: A | Discharge status: Home / Self Care | Payer: Self-pay | Source: Home / Self Care | Attending: Gastroenterology

## 2023-09-07 ENCOUNTER — Other Ambulatory Visit: Payer: Self-pay

## 2023-09-07 ENCOUNTER — Ambulatory Visit (HOSPITAL_COMMUNITY): Admitting: Anesthesiology

## 2023-09-07 ENCOUNTER — Ambulatory Visit (HOSPITAL_COMMUNITY)
Admission: RE | Admit: 2023-09-07 | Discharge: 2023-09-07 | Disposition: A | Discharge status: Home / Self Care | Attending: Gastroenterology | Admitting: Gastroenterology

## 2023-09-07 DIAGNOSIS — K2950 Unspecified chronic gastritis without bleeding: Secondary | ICD-10-CM | POA: Diagnosis not present

## 2023-09-07 DIAGNOSIS — K921 Melena: Secondary | ICD-10-CM | POA: Diagnosis not present

## 2023-09-07 DIAGNOSIS — R1010 Upper abdominal pain, unspecified: Secondary | ICD-10-CM | POA: Insufficient documentation

## 2023-09-07 DIAGNOSIS — Z8673 Personal history of transient ischemic attack (TIA), and cerebral infarction without residual deficits: Secondary | ICD-10-CM | POA: Insufficient documentation

## 2023-09-07 DIAGNOSIS — E119 Type 2 diabetes mellitus without complications: Secondary | ICD-10-CM | POA: Insufficient documentation

## 2023-09-07 DIAGNOSIS — K449 Diaphragmatic hernia without obstruction or gangrene: Secondary | ICD-10-CM

## 2023-09-07 DIAGNOSIS — E039 Hypothyroidism, unspecified: Secondary | ICD-10-CM | POA: Diagnosis not present

## 2023-09-07 DIAGNOSIS — Z7984 Long term (current) use of oral hypoglycemic drugs: Secondary | ICD-10-CM | POA: Diagnosis not present

## 2023-09-07 DIAGNOSIS — K2951 Unspecified chronic gastritis with bleeding: Secondary | ICD-10-CM | POA: Diagnosis not present

## 2023-09-07 DIAGNOSIS — K2289 Other specified disease of esophagus: Secondary | ICD-10-CM | POA: Insufficient documentation

## 2023-09-07 DIAGNOSIS — Z79899 Other long term (current) drug therapy: Secondary | ICD-10-CM | POA: Diagnosis not present

## 2023-09-07 DIAGNOSIS — D509 Iron deficiency anemia, unspecified: Secondary | ICD-10-CM | POA: Insufficient documentation

## 2023-09-07 DIAGNOSIS — K222 Esophageal obstruction: Secondary | ICD-10-CM

## 2023-09-07 DIAGNOSIS — R195 Other fecal abnormalities: Secondary | ICD-10-CM

## 2023-09-07 DIAGNOSIS — Z791 Long term (current) use of non-steroidal anti-inflammatories (NSAID): Secondary | ICD-10-CM | POA: Insufficient documentation

## 2023-09-07 DIAGNOSIS — I10 Essential (primary) hypertension: Secondary | ICD-10-CM | POA: Insufficient documentation

## 2023-09-07 DIAGNOSIS — K2970 Gastritis, unspecified, without bleeding: Secondary | ICD-10-CM | POA: Diagnosis not present

## 2023-09-07 DIAGNOSIS — K219 Gastro-esophageal reflux disease without esophagitis: Secondary | ICD-10-CM | POA: Diagnosis not present

## 2023-09-07 DIAGNOSIS — K3189 Other diseases of stomach and duodenum: Secondary | ICD-10-CM | POA: Insufficient documentation

## 2023-09-07 LAB — GLUCOSE, CAPILLARY: Glucose-Capillary: 124 mg/dL — ABNORMAL HIGH (ref 70–99)

## 2023-09-07 SURGERY — EGD (ESOPHAGOGASTRODUODENOSCOPY)
Anesthesia: Monitor Anesthesia Care

## 2023-09-07 MED ORDER — LIDOCAINE 2% (20 MG/ML) 5 ML SYRINGE
INTRAMUSCULAR | Status: DC | PRN
  Administered 2023-09-07: 100 mg via INTRAVENOUS

## 2023-09-07 MED ORDER — SODIUM CHLORIDE 0.9 % IV SOLN
INTRAVENOUS | Status: DC | PRN
Start: 2023-09-07 — End: 2023-09-07

## 2023-09-07 MED ORDER — PHENYLEPHRINE 80 MCG/ML (10ML) SYRINGE FOR IV PUSH (FOR BLOOD PRESSURE SUPPORT)
PREFILLED_SYRINGE | INTRAVENOUS | Status: DC | PRN
  Administered 2023-09-07: 160 ug via INTRAVENOUS

## 2023-09-07 MED ORDER — PROPOFOL 500 MG/50ML IV EMUL
INTRAVENOUS | Status: DC | PRN
  Administered 2023-09-07: 120 ug/kg/min via INTRAVENOUS

## 2023-09-07 MED ORDER — PROPOFOL 10 MG/ML IV BOLUS
INTRAVENOUS | Status: DC | PRN
  Administered 2023-09-07: 30 mg via INTRAVENOUS

## 2023-09-07 MED ORDER — FERROUS GLUCONATE 324 (38 FE) MG PO TABS: 324.0000 mg | ORAL_TABLET | Freq: Every day | ORAL | 3 refills | Status: DC

## 2023-09-07 NOTE — H&P (Signed)
 GASTROENTEROLOGY PROCEDURE H&P NOTE   Primary Care Physician: Nelwyn Salisbury, MD  HPI: Amy [name] is a 89 y.o. female who presents for EGD for evaluation of IDA and dark stools and NSAID use.  Past Medical History:  Diagnosis Date   ABSCESS 12/18/2009   DIABETES MELLITUS, TYPE II 05/22/2009   Diverticulosis    Hemorrhoids    HYPERGLYCEMIA 05/22/2009   HYPERTENSION 04/13/2007   HYPOTHYROIDISM 04/13/2007   NEOPLASM, SKIN, UNCERTAIN BEHAVIOR 01/11/2010   Stroke (HCC)    Tubular adenoma of colon 02/2001   Past Surgical History:  Procedure Laterality Date   ABDOMINAL HYSTERECTOMY     APPENDECTOMY     CHOLECYSTECTOMY     COLONOSCOPY  04-05-04   per Dr. Arlyce Dice, no polyps    OOPHORECTOMY     THYROIDECTOMY     secondary to nodule, no cancer   TONSILLECTOMY AND ADENOIDECTOMY     No current facility-administered medications for this encounter.   No current facility-administered medications for this encounter. Allergies  Allergen Reactions   Ativan [Lorazepam] Other (See Comments)    Caused delirium, confusion, disorientation, slightly increased agitation.   Diclofenac Sodium Nausea And Vomiting   Feldene [Piroxicam] Other (See Comments)    Dizzy   Latex Rash   Tape Rash   Family History  Problem Relation Age of Onset   Stroke Father    Hypertension Mother    Stroke Mother    Huntington's disease Daughter 39   Colon cancer Neg Hx    Social History   Socioeconomic History   Marital status: Widowed    Spouse name: Not on file   Number of children: 3   Years of education: Not on file   Highest education level: Not on file  Occupational History   Occupation: Retired  Tobacco Use   Smoking status: Never   Smokeless tobacco: Never  Vaping Use   Vaping status: Never Used  Substance and Sexual Activity   Alcohol use: No    Alcohol/week: 0.0 standard drinks of alcohol   Drug use: No   Sexual activity: Not on file  Other Topics Concern   Not on file   Social History Narrative   Daily caffeine    Social Drivers of Health   Financial Resource Strain: Low Risk  (12/01/2022)   Overall Financial Resource Strain (CARDIA)    Difficulty of Paying Living Expenses: Not hard at all  Food Insecurity: No Food Insecurity (12/01/2022)   Hunger Vital Sign    Worried About Running Out of Food in the Last Year: Never true    Ran Out of Food in the Last Year: Never true  Transportation Needs: No Transportation Needs (12/01/2022)   PRAPARE - Administrator, Civil Service (Medical): No    Lack of Transportation (Non-Medical): No  Physical Activity: Inactive (12/01/2022)   Exercise Vital Sign    Days of Exercise per Week: 0 days    Minutes of Exercise per Session: 0 min  Stress: No Stress Concern Present (12/01/2022)   [employer] of Occupational Health - Occupational Stress Questionnaire    Feeling of Stress : Not at all  Social Connections: Moderately Integrated (12/01/2022)   Social Connection and Isolation Panel [NHANES]    Frequency of Communication with Friends and Family: More than three times a week    Frequency of Social Gatherings with Friends and Family: More than three times a week    Attends Religious Services: More than 4 times per  year    Active Member of Clubs or Organizations: Yes    Attends Banker Meetings: More than 4 times per year    Marital Status: Widowed  Intimate Partner Violence: Not At Risk (12/01/2022)   Humiliation, Afraid, Rape, and Kick questionnaire    Fear of Current or Ex-Partner: No    Emotionally Abused: No    Physically Abused: No    Sexually Abused: No    Physical Exam: Today's Vitals   09/01/23 1111  Height: 5\' 7"  (1.702 m)   Body mass index is 29.76 kg/m. GEN: NAD EYE: Sclerae anicteric ENT: MMM CV: Non-tachycardic GI: Soft, NT/ND NEURO:  Alert & Oriented x 3  Lab Results: No results for input(s): "WBC", "HGB", "HCT", "PLT" in the last 72 hours. BMET No results for  input(s): "NA", "K", "CL", "CO2", "GLUCOSE", "BUN", "CREATININE", "CALCIUM" in the last 72 hours. LFT No results for input(s): "PROT", "ALBUMIN", "AST", "ALT", "ALKPHOS", "BILITOT", "BILIDIR", "IBILI" in the last 72 hours. PT/INR No results for input(s): "LABPROT", "INR" in the last 72 hours.   Impression / Plan: This is a 89 y.o.female who presents for EGD for evaluation of IDA and dark stools and NSAID use.  The risks and benefits of endoscopic evaluation/treatment were discussed with the patient and/or family; these include but are not limited to the risk of perforation, infection, bleeding, missed lesions, lack of diagnosis, severe illness requiring hospitalization, as well as anesthesia and sedation related illnesses.  The patient's history has been reviewed, patient examined, no change in status, and deemed stable for procedure.  The patient and/or family is agreeable to proceed.    Yong Henle, MD [facility] Advanced [facility]oscopy Office # [phone]

## 2023-09-07 NOTE — Op Note (Signed)
 [facility] Patient Name: Amy [name] Procedure Date: 09/07/2023 MRN: [id number] Attending MD: Corliss Parish , MD, [id number] Date of Birth: 03-09-1930 CSN: [id number] Age: 89 Admit Type: Outpatient Procedure:                Upper GI endoscopy Indications:              Upper abdominal pain, Iron deficiency anemia,                            Melena, Suspected upper gastrointestinal bleeding Providers:                Corliss Parish, MD, Fransisca Connors, Kandice Robinsons, Technician Referring MD:              Medicines:                Monitored Anesthesia Care Complications:            No immediate complications. Estimated Blood Loss:     Estimated blood loss was minimal. Procedure:                Pre-Anesthesia Assessment:                           - Prior to the procedure, a History and Physical                            was performed, and patient medications and                            allergies were reviewed. The patient's tolerance of                            previous anesthesia was also reviewed. The risks                            and benefits of the procedure and the sedation                            options and risks were discussed with the patient.                            All questions were answered, and informed consent                            was obtained. Prior Anticoagulants: The patient has                            taken no anticoagulant or antiplatelet agents                            except for NSAID medication. ASA Grade Assessment:  III - A patient with severe systemic disease. After                            reviewing the risks and benefits, the patient was                            deemed in satisfactory condition to undergo the                            procedure.                           After obtaining informed consent, the endoscope was                             passed under direct vision. Throughout the                            procedure, the patient's blood pressure, pulse, and                            oxygen saturations were monitored continuously. The                            [id number]-[id number] ([id number]) Olympus endoscope was introduced                            through the mouth, and advanced to the second part                            of duodenum. The upper GI endoscopy was                            accomplished without difficulty. The patient                            tolerated the procedure. Scope In: Scope Out: Findings:      No gross lesions were noted in the entire esophagus.      The Z-line was irregular and was found 38 cm from the incisors.      A 2 cm hiatal hernia was present.      Patchy mildly erythematous mucosa without bleeding was found in the       entire examined stomach. Biopsies were taken with a cold forceps for       histology and Helicobacter pylori testing.      No gross lesions were noted in the duodenal bulb, in the first portion       of the duodenum and in the second portion of the duodenum. Biopsies for       histology were taken with a cold forceps for evaluation of celiac       disease. Impression:               - No gross lesions in the entire esophagus.                           -  Z-line irregular, 38 cm from the incisors.                           - 2 cm hiatal hernia.                           - Erythematous mucosa in the stomach. Biopsied.                           - No gross lesions in the duodenal bulb, in the                            first portion of the duodenum and in the second                            portion of the duodenum. Biopsied. Moderate Sedation:      Not Applicable - Patient had care per Anesthesia. Recommendation:           - The patient will be observed post-procedure,                            until all discharge criteria are met.                           - Discharge patient  to home.                           - Patient has a contact number available for                            emergencies. The signs and symptoms of potential                            delayed complications were discussed with the                            patient. Return to normal activities tomorrow.                            Written discharge instructions were provided to the                            patient.                           - Resume previous diet.                           - Start ferrous gluconate 324 mg once daily. If                            patient has GI upset from using daily can go to                            every other  day.                           - Plan to repeat CBC and iron indices in 6 to 8                            weeks. If she continues to have evidence of iron                            deficiency (longstanding now chronic anemia noted                            for years based on laboratories in epic system),                            will need to consider colonoscopy +/- video capsule                            endoscopy.                           - Will plan follow-up in clinic after her                            laboratories have resulted so we can discuss this                            further with myself or one of my APP's.                           - Continue current present medications otherwise.                           - The findings and recommendations were discussed                            with the patient.                           - The findings and recommendations were discussed                            with the patient's family. Procedure Code(s):        --- Professional ---                           [phone], Esophagogastroduodenoscopy, flexible,                            transoral; with biopsy, single or multiple Diagnosis Code(s):        --- Professional ---                           K22.89, Other specified disease of  esophagus  K44.9, Diaphragmatic hernia without obstruction or                            gangrene                           K31.89, Other diseases of stomach and duodenum                           R10.10, Upper abdominal pain, unspecified                           D50.9, Iron deficiency anemia, unspecified                           K92.1, Melena (includes Hematochezia) CPT copyright 2022 American Medical Association. All rights reserved. The codes documented in this report are preliminary and upon coder review may  be revised to meet current compliance requirements. Yong Henle, MD 09/07/2023 1:25:51 PM Number of Addenda: 0

## 2023-09-07 NOTE — Anesthesia Preprocedure Evaluation (Signed)
 Anesthesia Evaluation  Patient identified by MRN, date of birth, ID band Patient awake    Reviewed: Allergy & Precautions, NPO status , Patient's Chart, lab work & pertinent test results  Airway Mallampati: I  TM Distance: >3 FB Neck ROM: Full    Dental no notable dental hx.    Pulmonary neg pulmonary ROS   Pulmonary exam normal        Cardiovascular hypertension, Pt. on medications  Rhythm:Regular Rate:Normal     Neuro/Psych    Depression    CVA    GI/Hepatic Neg liver ROS,GERD  Medicated,,  Endo/Other  diabetes, Type 2, Oral Hypoglycemic AgentsHypothyroidism    Renal/GU negative Renal ROS  negative genitourinary   Musculoskeletal negative musculoskeletal ROS (+)    Abdominal Normal abdominal exam  (+)   Peds  Hematology Lab Results      Component                Value               Date                      WBC                      7.8                 09/01/2023                HGB                      10.9 (L)            09/01/2023                HCT                      33.5 (L)            09/01/2023                MCV                      90.9                09/01/2023                PLT                      223.0               09/01/2023              Anesthesia Other Findings   Reproductive/Obstetrics                             Anesthesia Physical Anesthesia Plan  ASA: 3  Anesthesia Plan: MAC   Post-op Pain Management:    Induction:   PONV Risk Score and Plan: Propofol infusion and Treatment may vary due to age or medical condition  Airway Management Planned: Simple Face Mask and Nasal Cannula  Additional Equipment: None  Intra-op Plan:   Post-operative Plan:   Informed Consent: I have reviewed the patients History and Physical, chart, labs and discussed the procedure including the risks, benefits and alternatives for the proposed anesthesia with the patient or  authorized representative who has indicated his/her understanding and acceptance.  Dental advisory given  Plan Discussed with: CRNA  Anesthesia Plan Comments:        Anesthesia Quick Evaluation

## 2023-09-07 NOTE — Transfer of Care (Signed)
 Immediate Anesthesia Transfer of Care Note  Patient: Amy [name]  Procedure(s) Performed: EGD (ESOPHAGOGASTRODUODENOSCOPY) BIOPSY, SKIN, SUBCUTANEOUS TISSUE, OR MUCOUS MEMBRANE  Patient Location: PACU  Anesthesia Type:MAC  Level of Consciousness: sedated  Airway & Oxygen Therapy: Patient Spontanous Breathing and Patient connected to face mask oxygen  Post-op Assessment: Report given to RN and Post -op Vital signs reviewed and stable  Post vital signs: Reviewed and stable  Last Vitals:  Vitals Value Taken Time  BP    Temp    Pulse    Resp    SpO2      Last Pain:  Vitals:   09/07/23 1239  TempSrc: Tympanic  PainSc: 0-No pain         Complications: No notable events documented.

## 2023-09-07 NOTE — Discharge Instructions (Signed)

## 2023-09-08 ENCOUNTER — Other Ambulatory Visit: Payer: Self-pay | Admitting: Family Medicine

## 2023-09-08 LAB — SURGICAL PATHOLOGY

## 2023-09-08 NOTE — Anesthesia Postprocedure Evaluation (Signed)
 Anesthesia Post Note  Patient: Amy [name]  Procedure(s) Performed: EGD (ESOPHAGOGASTRODUODENOSCOPY) BIOPSY, SKIN, SUBCUTANEOUS TISSUE, OR MUCOUS MEMBRANE     Patient location during evaluation: PACU Anesthesia Type: MAC Level of consciousness: awake and alert Pain management: pain level controlled Vital Signs Assessment: post-procedure vital signs reviewed and stable Respiratory status: spontaneous breathing, nonlabored ventilation, respiratory function stable and patient connected to nasal cannula oxygen Cardiovascular status: stable and blood pressure returned to baseline Postop Assessment: no apparent nausea or vomiting Anesthetic complications: no   No notable events documented.  Last Vitals:  Vitals:   09/07/23 1319 09/07/23 1330  BP: (!) 147/74 (!) 145/50  Pulse: 73 67  Resp: 14 17  Temp:    SpO2: 100% 98%    Last Pain:  Vitals:   09/08/23 1027  TempSrc:   PainSc: 0-No pain                 Amy [name] P Niquita Digioia

## 2023-09-09 ENCOUNTER — Encounter (HOSPITAL_COMMUNITY): Payer: Self-pay | Admitting: Gastroenterology

## 2023-09-10 ENCOUNTER — Telehealth: Payer: Self-pay

## 2023-09-10 ENCOUNTER — Encounter: Payer: Self-pay | Admitting: Gastroenterology

## 2023-09-10 DIAGNOSIS — K921 Melena: Secondary | ICD-10-CM

## 2023-09-10 NOTE — Telephone Encounter (Signed)
 clinic visit with APP or myself in 8 weeks.  Just prior to her clinic visit she needs a CBC/iron/TIBC/ferritin to be drawn to help us  determine the next steps in her evaluation if she remains iron deficient.

## 2023-09-10 NOTE — Telephone Encounter (Signed)
 The pt has been advised of the need for labs a day or two prior to her appt. The appt has been changed to Loa Riling PA at 11 am on 5/29.  She verbalized understanding of the information.

## 2023-09-17 ENCOUNTER — Telehealth: Payer: Self-pay | Admitting: Gastroenterology

## 2023-09-17 NOTE — Telephone Encounter (Signed)
 The pt has been taking ferrous gluconate  324 mg daily and immediately started having nausea.  Last dose 2 days ago and the nausea has resolved.  She would like to know how to proceed.

## 2023-09-17 NOTE — Telephone Encounter (Signed)
 If patient is willing to consider taking the iron every other day after a few more days off, that may be helpful. If she cannot tolerate that, then referral to hematology to proceed with IV iron infusions makes sense. Thanks. GM

## 2023-09-17 NOTE — Telephone Encounter (Signed)
 The pt agrees to try every other day after a few more days off of the iron and call back with an update.

## 2023-09-17 NOTE — Telephone Encounter (Signed)
 PT is calling to update us  that the iron pills she was prescribed is making her sick and she would like to know if there is anything else she can take. Please advise.

## 2023-10-06 ENCOUNTER — Other Ambulatory Visit: Payer: Self-pay | Admitting: Family Medicine

## 2023-10-07 ENCOUNTER — Encounter: Payer: Self-pay | Admitting: Gastroenterology

## 2023-10-22 ENCOUNTER — Ambulatory Visit: Admitting: Gastroenterology

## 2023-10-30 ENCOUNTER — Other Ambulatory Visit: Payer: Self-pay | Admitting: Family Medicine

## 2023-11-03 DIAGNOSIS — C44319 Basal cell carcinoma of skin of other parts of face: Secondary | ICD-10-CM | POA: Diagnosis not present

## 2023-11-16 DIAGNOSIS — H524 Presbyopia: Secondary | ICD-10-CM | POA: Diagnosis not present

## 2023-11-22 ENCOUNTER — Other Ambulatory Visit: Payer: Self-pay | Admitting: Family Medicine

## 2023-12-01 ENCOUNTER — Ambulatory Visit: Admitting: Gastroenterology

## 2023-12-04 ENCOUNTER — Telehealth: Payer: Self-pay

## 2023-12-04 NOTE — Telephone Encounter (Signed)
 Unsuccessful attempts to reach patient on preferred number listed in notes for scheduled AWV Unable to leave message.

## 2023-12-09 DIAGNOSIS — C44319 Basal cell carcinoma of skin of other parts of face: Secondary | ICD-10-CM | POA: Diagnosis not present

## 2023-12-21 ENCOUNTER — Other Ambulatory Visit: Payer: Self-pay | Admitting: Gastroenterology

## 2023-12-21 ENCOUNTER — Other Ambulatory Visit: Payer: Self-pay | Admitting: Family Medicine

## 2024-01-18 ENCOUNTER — Other Ambulatory Visit: Payer: Self-pay | Admitting: Family Medicine

## 2024-02-11 ENCOUNTER — Other Ambulatory Visit: Payer: Self-pay | Admitting: Family Medicine

## 2024-03-16 ENCOUNTER — Other Ambulatory Visit: Payer: Self-pay | Admitting: Family Medicine

## 2024-04-04 ENCOUNTER — Ambulatory Visit

## 2024-04-06 ENCOUNTER — Ambulatory Visit

## 2024-04-08 ENCOUNTER — Other Ambulatory Visit: Payer: Self-pay | Admitting: Family Medicine

## 2024-04-08 ENCOUNTER — Other Ambulatory Visit: Payer: Self-pay | Admitting: Gastroenterology

## 2024-04-13 ENCOUNTER — Ambulatory Visit: Admitting: Family Medicine

## 2024-04-13 ENCOUNTER — Ambulatory Visit

## 2024-04-13 VITALS — BP 120/60 | HR 63 | Temp 98.4°F | Ht 65.0 in

## 2024-04-13 DIAGNOSIS — Z0000 Encounter for general adult medical examination without abnormal findings: Secondary | ICD-10-CM | POA: Diagnosis not present

## 2024-04-13 NOTE — Patient Instructions (Addendum)
 Ms. Botelho,  Thank you for taking the time for your Medicare Wellness Visit. I appreciate your continued commitment to your health goals. Please review the care plan we discussed, and feel free to reach out if I can assist you further.  Please note that Annual Wellness Visits do not include a physical exam. Some assessments may be limited, especially if the visit was conducted virtually. If needed, we may recommend an in-person follow-up with your provider.  Ongoing Care Seeing your primary care provider every 3 to 6 months helps us  monitor your health and provide consistent, personalized care.    Referrals If a referral was made during today's visit and you haven't received any updates within two weeks, please contact the referred provider directly to check on the status.  Recommended Screenings:  Health Maintenance  Topic Date Due   Zoster (Shingles) Vaccine (1 of 2) 01/16/1949   Complete foot exam   04/25/2014   Eye exam for diabetics  11/16/2021   Hemoglobin A1C  12/23/2022   Flu Shot  12/25/2023   COVID-19 Vaccine (4 - 2025-26 season) 01/25/2024   Osteoporosis screening with Bone Density Scan  07/23/2024*   Medicare Annual Wellness Visit  04/13/2025   Pneumococcal Vaccine for age over 46  Completed   Meningitis B Vaccine  Aged Out   DTaP/Tdap/Td vaccine  Discontinued  *Topic was postponed. The date shown is not the original due date.  Opioid Pain Medicine Management Opioids are powerful medicines that are used to treat moderate to severe pain. When used for short periods of time, they can help you to: Sleep better. Do better in physical or occupational therapy. Feel better in the first few days after an injury. Recover from surgery. Opioids should be taken with the supervision of a trained health care provider. They should be taken for the shortest period of time possible. This is because opioids can be addictive, and the longer you take opioids, the greater your risk of  addiction. This addiction can also be called opioid use disorder. What are the risks? Using opioid pain medicines for longer than 3 days increases your risk of side effects. Side effects include: Constipation. Nausea and vomiting. Breathing difficulties (respiratory depression). Drowsiness. Confusion. Opioid use disorder. Itching. Taking opioid pain medicine for a long period of time can affect your ability to do daily tasks. It also puts you at risk for: Motor vehicle crashes. Depression. Suicide. Heart attack. Overdose, which can be life-threatening. What is a pain treatment plan? A pain treatment plan is an agreement between you and your health care provider. Pain is unique to each person, and treatments vary depending on your condition. To manage your pain, you and your health care provider need to work together. To help you do this: Discuss the goals of your treatment, including how much pain you might expect to have and how you will manage the pain. Review the risks and benefits of taking opioid medicines. Remember that a good treatment plan uses more than one approach and minimizes the chance of side effects. Be honest about the amount of medicines you take and about any drug or alcohol use. Get pain medicine prescriptions from only one health care provider. Pain can be managed with many types of alternative treatments. Ask your health care provider to refer you to one or more specialists who can help you manage pain through: Physical or occupational therapy. Counseling (cognitive behavioral therapy). Good nutrition. Biofeedback. Massage. Meditation. Non-opioid medicine. Following a gentle exercise program. How  to use opioid pain medicine Taking medicine Take your pain medicine exactly as told by your health care provider. Take it only when you need it. If your pain gets less severe, you may take less than your prescribed dose if your health care provider approves. If you  are not having pain, do nottake pain medicine unless your health care provider tells you to take it. If your pain is severe, do nottry to treat it yourself by taking more pills than instructed on your prescription. Contact your health care provider for help. Write down the times when you take your pain medicine. It is easy to become confused while on pain medicine. Writing the time can help you avoid overdose. Take other over-the-counter or prescription medicines only as told by your health care provider. Keeping yourself and others safe  While you are taking opioid pain medicine: Do not drive, use machinery, or power tools. Do not sign legal documents. Do not drink alcohol. Do not take sleeping pills. Do not supervise children by yourself. Do not do activities that require climbing or being in high places. Do not go to a lake, river, ocean, spa, or swimming pool. Do not share your pain medicine with anyone. Keep pain medicine in a locked cabinet or in a secure area where pets and children cannot reach it. Stopping your use of opioids If you have been taking opioid medicine for more than a few weeks, you may need to slowly decrease (taper) how much you take until you stop completely. Tapering your use of opioids can decrease your risk of symptoms of withdrawal, such as: Pain and cramping in the abdomen. Nausea. Sweating. Sleepiness. Restlessness. Uncontrollable shaking (tremors). Cravings for the medicine. Do not attempt to taper your use of opioids on your own. Talk with your health care provider about how to do this. Your health care provider may prescribe a step-down schedule based on how much medicine you are taking and how long you have been taking it. Getting rid of leftover pills Do not save any leftover pills. Get rid of leftover pills safely by: Taking the medicine to a prescription take-back program. This is usually offered by the county or law enforcement. Bringing them to a  pharmacy that has a drug disposal container. Flushing them down the toilet. Check the label or package insert of your medicine to see whether this is safe to do. Throwing them out in the trash. Check the label or package insert of your medicine to see whether this is safe to do. If it is safe to throw it out, remove the medicine from the original container, put it into a sealable bag or container, and mix it with used coffee grounds, food scraps, dirt, or cat litter before putting it in the trash. Follow these instructions at home: Activity Do exercises as told by your health care provider. Avoid activities that make your pain worse. Return to your normal activities as told by your health care provider. Ask your health care provider what activities are safe for you. General instructions You may need to take these actions to prevent or treat constipation: Drink enough fluid to keep your urine pale yellow. Take over-the-counter or prescription medicines. Eat foods that are high in fiber, such as beans, whole grains, and fresh fruits and vegetables. Limit foods that are high in fat and processed sugars, such as fried or sweet foods. Keep all follow-up visits. This is important. Where to find support If you have been taking opioids for  a long time, you may benefit from receiving support for quitting from a local support group or counselor. Ask your health care provider for a referral to these resources in your area. Where to find more information Centers for Disease Control and Prevention (CDC): footballexhibition.com.br [place] Food and Drug Administration (FDA): pumpkinsearch.com.ee Get help right away if: You may have taken too much of an opioid (overdosed). Common symptoms of an overdose: Your breathing is slower or more shallow than normal. You have a very slow heartbeat (pulse). You have slurred speech. You have nausea and vomiting. Your pupils become very small. You have other potential symptoms: You are very  confused. You faint or feel like you will faint. You have cold, clammy skin. You have blue lips or fingernails. You have thoughts of harming yourself or harming others. These symptoms may represent a serious problem that is an emergency. Do not wait to see if the symptoms will go away. Get medical help right away. Call your local emergency services (911 in the [place] Do not drive yourself to the hospital.  If you ever feel like you may hurt yourself or others, or have thoughts about taking your own life, get help right away. Go to your nearest emergency department or: Call your local emergency services (911 in the [place] Call the [facility] (712-583-0902 in the [place] Call a suicide crisis helpline, such as the National Suicide Prevention Lifeline at (847)675-3464 or 988 in the [place] This is open 24 hours a day in the [place] If you're a Veteran: Call 988 and press 1. This is open 24 hours a day. Text the [employer] at 502 099 6910. Summary Opioid medicines can help you manage moderate to severe pain for a short period of time. A pain treatment plan is an agreement between you and your health care provider. Discuss the goals of your treatment, including how much pain you might expect to have and how you will manage the pain. If you think that you or someone else may have taken too much of an opioid, get medical help right away. This information is not intended to replace advice given to you by your health care provider. Make sure you discuss any questions you have with your health care provider. Document Revised: 02/16/2023 Document Reviewed: 08/22/2020 Elsevier Patient Education  2024 Elsevier Inc.     04/13/2024    4:13 PM  Advanced Directives  Does Patient Have a Medical Advance Directive? Yes  Type of Estate Agent of [place];Living will  Does patient want to make changes to medical advance directive? No - Patient declined  Copy of  Healthcare Power of Attorney in Chart? No - copy requested    Vision: Annual vision screenings are recommended for early detection of glaucoma, cataracts, and diabetic retinopathy. These exams can also reveal signs of chronic conditions such as diabetes and high blood pressure.  Dental: Annual dental screenings help detect early signs of oral cancer, gum disease, and other conditions linked to overall health, including heart disease and diabetes.  Please see the attached documents for additional preventive care recommendations.

## 2024-04-13 NOTE — Progress Notes (Signed)
 Chief Complaint  Patient presents with   Medicare Wellness     Subjective:   Amy [name] is a 89 y.o. female who presents for a Medicare Annual Wellness Visit.  Allergies (verified) Ativan  [lorazepam ], Diclofenac sodium, Feldene [piroxicam], Latex, and Tape   History: Past Medical History:  Diagnosis Date   ABSCESS 12/18/2009   DIABETES MELLITUS, TYPE II 05/22/2009   Diverticulosis    Hemorrhoids    HYPERGLYCEMIA 05/22/2009   HYPERTENSION 04/13/2007   HYPOTHYROIDISM 04/13/2007   NEOPLASM, SKIN, UNCERTAIN BEHAVIOR 01/11/2010   Stroke (HCC)    Tubular adenoma of colon 02/2001   Past Surgical History:  Procedure Laterality Date   ABDOMINAL HYSTERECTOMY     APPENDECTOMY     BIOPSY OF SKIN SUBCUTANEOUS TISSUE AND/OR MUCOUS MEMBRANE  09/07/2023   Procedure: BIOPSY, SKIN, SUBCUTANEOUS TISSUE, OR MUCOUS MEMBRANE;  Surgeon: Wilhelmenia Aloha Raddle., MD;  Location: [facility];  Service: Gastroenterology;;   CHOLECYSTECTOMY     COLONOSCOPY  04-05-04   per Dr. Debrah, no polyps    ESOPHAGOGASTRODUODENOSCOPY N/A 09/07/2023   Procedure: EGD (ESOPHAGOGASTRODUODENOSCOPY);  Surgeon: Wilhelmenia Aloha Raddle., MD;  Location: [facility];  Service: Gastroenterology;  Laterality: N/A;   OOPHORECTOMY     THYROIDECTOMY     secondary to nodule, no cancer   TONSILLECTOMY AND ADENOIDECTOMY     Family History  Problem Relation Age of Onset   Stroke Father    Hypertension Mother    Stroke Mother    Huntington's disease Daughter 10   Colon cancer Neg Hx    Social History   Occupational History   Occupation: Retired  Tobacco Use   Smoking status: Never   Smokeless tobacco: Never  Vaping Use   Vaping status: Never Used  Substance and Sexual Activity   Alcohol use: No    Alcohol/week: 0.0 standard drinks of alcohol   Drug use: No   Sexual activity: Not on file   Tobacco Counseling Counseling given: No  SDOH Screenings   Food Insecurity: No Food Insecurity (04/13/2024)   Housing: Unknown (04/13/2024)  Transportation Needs: No Transportation Needs (04/13/2024)  Utilities: Not At Risk (04/13/2024)  Alcohol Screen: Low Risk  (12/01/2022)  Depression (PHQ2-9): Low Risk  (04/13/2024)  Financial Resource Strain: Low Risk  (12/01/2022)  Physical Activity: Inactive (04/13/2024)  Social Connections: Moderately Integrated (04/13/2024)  Stress: No Stress Concern Present (04/13/2024)  Tobacco Use: Low Risk  (04/13/2024)  Health Literacy: Adequate Health Literacy (04/13/2024)   See flowsheets for full screening details  Depression Screen PHQ 2 & 9 Depression Scale- Over the past 2 weeks, how often have you been bothered by any of the following problems? Little interest or pleasure in doing things: 0 Feeling down, depressed, or hopeless (PHQ Adolescent also includes...irritable): 0 PHQ-2 Total Score: 0     Goals Addressed               This Visit's Progress     Remain active (pt-stated)         Visit info / Clinical Intake: Medicare Wellness Visit Type:: Subsequent Annual Wellness Visit Persons participating in visit:: patient Medicare Wellness Visit Mode:: In-person (required for WTM) Information given by:: patient Interpreter Needed?: No Pre-visit prep was completed: no AWV questionnaire completed by patient prior to visit?: no Living arrangements:: (!) lives alone Patient's Overall Health Status Rating: very good Typical amount of pain: some (Followed by medical attention) Does pain affect daily life?: no Are you currently prescribed opioids?: (!) yes  Dietary Habits and  Nutritional Risks How many meals a day?: 3 Eats fruit and vegetables daily?: yes Most meals are obtained by: having others provide food (Facility prepares meals) In the last 2 weeks, have you had any of the following?: none Diabetic:: (!) yes Any non-healing wounds?: no How often do you check your BS?: as needed Would you like to be referred to a Nutritionist or for Diabetic  Management? : no  Functional Status Activities of Daily Living (to include ambulation/medication): Independent Ambulation: Independent with device- listed below Home Assistive Devices/Equipment: Walker (specify Type); Eyeglasses; Other (Comment) (Wears Hearing Aids) Medication Administration: Independent Home Management: Independent Manage your own finances?: yes Primary transportation is: facility / other Concerns about vision?: no *vision screening is required for WTM* Concerns about hearing?: (!) yes Uses hearing aids?: (!) yes Hear whispered voice?: yes  Fall Screening Falls in the past year?: 0 Number of falls in past year: 0 Was there an injury with Fall?: 0 Fall Risk Category Calculator: 0 Patient Fall Risk Level: Low Fall Risk  Fall Risk Patient at Risk for Falls Due to: No Fall Risks Fall risk Follow up: Falls evaluation completed  Home and Transportation Safety: All rugs have non-skid backing?: yes All stairs or steps have railings?: N/A, no stairs Grab bars in the bathtub or shower?: yes Have non-skid surface in bathtub or shower?: yes Good home lighting?: yes Regular seat belt use?: yes Hospital stays in the last year:: no  Cognitive Assessment Difficulty concentrating, remembering, or making decisions? : no Will 6CIT or Mini Cog be Completed: no 6CIT or Mini Cog Declined: patient alert, oriented, able to answer questions appropriately and recall recent events  Advance Directives (For Healthcare) Does Patient Have a Medical Advance Directive?: Yes Does patient want to make changes to medical advance directive?: No - Patient declined Type of Advance Directive: Healthcare Power of [place]; Living will Copy of Healthcare Power of Attorney in Chart?: No - copy requested Copy of Living Will in Chart?: No - copy requested  Reviewed/Updated  Reviewed/Updated: Reviewed All (Medical, Surgical, Family, Medications, Allergies, Care Teams, Patient Goals)         Objective:    Today's Vitals   04/13/24 1611  BP: 120/60  Pulse: 63  Temp: 98.4 F (36.9 C)  TempSrc: Oral  SpO2: 93%  Height: 5' 5 (1.651 m)   Body mass index is 31.62 kg/m.  Current Medications (verified) Outpatient Encounter Medications as of 04/13/2024  Medication Sig   amLODipine  (NORVASC ) 5 MG tablet Take 1 tablet (5 mg total) by mouth 2 (two) times daily.   aspirin  81 MG tablet Take 1 tablet (81 mg total) by mouth daily.   atorvastatin  (LIPITOR) 40 MG tablet Take 1 tablet (40 mg total) by mouth daily.   blood glucose meter kit and supplies Dispense based on patient and insurance preference. Use up to four times daily as directed. (FOR ICD-10 E10.9, E11.9).   docusate sodium  (COLACE) 100 MG capsule Take 1 capsule (100 mg total) by mouth 2 (two) times daily.   donepezil  (ARICEPT ) 10 MG tablet TAKE ONE TABLET BY MOUTH AT BEDTIME   famotidine  (PEPCID ) 40 MG tablet Take 1 tablet (40 mg total) by mouth at bedtime.   ferrous gluconate  (FERGON) 324 MG tablet TAKE ONE TABLET BY MOUTH IN THE MORNING WITH BREAKFAST   gabapentin  (NEURONTIN ) 300 MG capsule TAKE ONE CAPSULE BY MOUTH TWICE DAILY   glipiZIDE  (GLUCOTROL ) 5 MG tablet Take 1 tablet (5 mg total) by mouth 2 (two) times daily  before a meal.   ibuprofen (ADVIL,MOTRIN) 200 MG tablet Take 400 mg by mouth at bedtime as needed (RLS).   levothyroxine  (SYNTHROID ) 200 MCG tablet Take 1 tablet (200 mcg total) by mouth daily.   meclizine  (ANTIVERT ) 25 MG tablet Take 1 tablet (25 mg total) by mouth 3 (three) times daily as needed for dizziness.   meloxicam  (MOBIC ) 15 MG tablet Take 1 tablet (15 mg total) by mouth daily.   metFORMIN  (GLUCOPHAGE ) 500 MG tablet TAKE ONE TABLET BY MOUTH TWICE DAILY   Multiple Vitamin (MULTIVITAMIN WITH MINERALS) TABS tablet Take 1 tablet by mouth daily.   omeprazole  (PRILOSEC) 40 MG capsule Take 1 capsule (40 mg total) by mouth in the morning.   oxybutynin  (DITROPAN  XL) 10 MG 24 hr tablet Take 1 tablet (10  mg total) by mouth at bedtime.   sertraline  (ZOLOFT ) 50 MG tablet Take 1 tablet (50 mg total) by mouth daily.   sitaGLIPtin  (JANUVIA ) 100 MG tablet Take 1 tablet (100 mg total) by mouth daily.   sucralfate  (CARAFATE ) 1 g tablet Take 1 tablet (1 g total) by mouth 4 (four) times daily.   traMADol  (ULTRAM ) 50 MG tablet TAKE 2 TABLETS EVERY 6 HOURS AS NEEDED FOR MODERATE PAIN.   No facility-administered encounter medications on file as of 04/13/2024.   Hearing/Vision screen Hearing Screening - Comments:: Wears Hearing Aids Vision Screening - Comments:: Wears rx glasses - up to date with routine eye exams with  [facility] Immunizations and Health Maintenance Health Maintenance  Topic Date Due   Zoster Vaccines- Shingrix (1 of 2) 01/16/1949   FOOT EXAM  04/25/2014   OPHTHALMOLOGY EXAM  11/16/2021   HEMOGLOBIN A1C  12/23/2022   Influenza Vaccine  12/25/2023   COVID-19 Vaccine (4 - 2025-26 season) 01/25/2024   Bone Density Scan  07/23/2024 (Originally 01/15/1997)   Medicare Annual Wellness (AWV)  04/13/2025   Pneumococcal Vaccine: 50+ Years  Completed   Meningococcal B Vaccine  Aged Out   DTaP/Tdap/Td  Discontinued        Assessment/Plan:  This is a routine wellness examination for Amy [name].  Patient Care Team: Johnny Garnette LABOR, MD as PCP - General (Family Medicine)  I have personally reviewed and noted the following in the patient's chart:   Medical and social history Use of alcohol, tobacco or illicit drugs  Current medications and supplements including opioid prescriptions. Functional ability and status Nutritional status Physical activity Advanced directives List of other physicians Hospitalizations, surgeries, and ER visits in previous 12 months Vitals Screenings to include cognitive, depression, and falls Referrals and appointments  No orders of the defined types were placed in this encounter.  In addition, I have reviewed and discussed with patient certain preventive  protocols, quality metrics, and best practice recommendations. A written personalized care plan for preventive services as well as general preventive health recommendations were provided to patient.   Rojelio LELON Blush, LPN   [id number]    Return in 1 year on 04/19/25  After Visit Summary: (In Person-Declined) Patient declined AVS at this time.  Nurse Notes: None

## 2024-05-06 DIAGNOSIS — M79674 Pain in right toe(s): Secondary | ICD-10-CM | POA: Diagnosis not present

## 2024-05-06 DIAGNOSIS — M79675 Pain in left toe(s): Secondary | ICD-10-CM | POA: Diagnosis not present

## 2024-05-06 DIAGNOSIS — B351 Tinea unguium: Secondary | ICD-10-CM | POA: Diagnosis not present

## 2024-05-06 DIAGNOSIS — E1142 Type 2 diabetes mellitus with diabetic polyneuropathy: Secondary | ICD-10-CM | POA: Diagnosis not present

## 2024-05-06 DIAGNOSIS — L600 Ingrowing nail: Secondary | ICD-10-CM | POA: Diagnosis not present

## 2024-06-07 ENCOUNTER — Other Ambulatory Visit: Payer: Self-pay | Admitting: Family Medicine

## 2024-06-07 DIAGNOSIS — E119 Type 2 diabetes mellitus without complications: Secondary | ICD-10-CM

## 2024-06-07 DIAGNOSIS — I10 Essential (primary) hypertension: Secondary | ICD-10-CM

## 2024-06-30 ENCOUNTER — Encounter: Payer: Self-pay | Admitting: Family Medicine

## 2024-06-30 ENCOUNTER — Ambulatory Visit: Admitting: Family Medicine

## 2024-06-30 VITALS — BP 130/82 | HR 75 | Temp 98.7°F | Wt 190.0 lb

## 2024-06-30 DIAGNOSIS — L739 Follicular disorder, unspecified: Secondary | ICD-10-CM

## 2024-06-30 DIAGNOSIS — R195 Other fecal abnormalities: Secondary | ICD-10-CM

## 2024-06-30 DIAGNOSIS — D509 Iron deficiency anemia, unspecified: Secondary | ICD-10-CM

## 2024-06-30 DIAGNOSIS — I10 Essential (primary) hypertension: Secondary | ICD-10-CM

## 2024-06-30 DIAGNOSIS — R509 Fever, unspecified: Secondary | ICD-10-CM

## 2024-06-30 DIAGNOSIS — E538 Deficiency of other specified B group vitamins: Secondary | ICD-10-CM

## 2024-06-30 DIAGNOSIS — N390 Urinary tract infection, site not specified: Secondary | ICD-10-CM

## 2024-06-30 DIAGNOSIS — E119 Type 2 diabetes mellitus without complications: Secondary | ICD-10-CM

## 2024-06-30 DIAGNOSIS — E039 Hypothyroidism, unspecified: Secondary | ICD-10-CM

## 2024-06-30 LAB — HEPATIC FUNCTION PANEL
ALT: 13 U/L (ref 3–35)
AST: 16 U/L (ref 5–37)
Albumin: 4.2 g/dL (ref 3.5–5.2)
Alkaline Phosphatase: 91 U/L (ref 39–117)
Bilirubin, Direct: 0.2 mg/dL (ref 0.1–0.3)
Total Bilirubin: 0.8 mg/dL (ref 0.2–1.2)
Total Protein: 8.3 g/dL (ref 6.0–8.3)

## 2024-06-30 LAB — CBC WITH DIFFERENTIAL/PLATELET
Basophils Absolute: 0 10*3/uL (ref 0.0–0.1)
Basophils Relative: 0.2 % (ref 0.0–3.0)
Eosinophils Absolute: 0.1 10*3/uL (ref 0.0–0.7)
Eosinophils Relative: 0.7 % (ref 0.0–5.0)
HCT: 35.9 % — ABNORMAL LOW (ref 36.0–46.0)
Hemoglobin: 11.9 g/dL — ABNORMAL LOW (ref 12.0–15.0)
Lymphocytes Relative: 24.2 % (ref 12.0–46.0)
Lymphs Abs: 1.8 10*3/uL (ref 0.7–4.0)
MCHC: 33.1 g/dL (ref 30.0–36.0)
MCV: 92.6 fl (ref 78.0–100.0)
Monocytes Absolute: 1 10*3/uL (ref 0.1–1.0)
Monocytes Relative: 12.9 % — ABNORMAL HIGH (ref 3.0–12.0)
Neutro Abs: 4.7 10*3/uL (ref 1.4–7.7)
Neutrophils Relative %: 62 % (ref 43.0–77.0)
Platelets: 189 10*3/uL (ref 150.0–400.0)
RBC: 3.88 Mil/uL (ref 3.87–5.11)
RDW: 14.4 % (ref 11.5–15.5)
WBC: 7.5 10*3/uL (ref 4.0–10.5)

## 2024-06-30 LAB — IBC + FERRITIN
Ferritin: 34.6 ng/mL (ref 10.0–291.0)
Iron: 60 ug/dL (ref 42–145)
Saturation Ratios: 16.9 % — ABNORMAL LOW (ref 20.0–50.0)
TIBC: 355.6 ug/dL (ref 250.0–450.0)
Transferrin: 254 mg/dL (ref 212.0–360.0)

## 2024-06-30 LAB — POCT INFLUENZA A/B
Influenza A, POC: NEGATIVE
Influenza B, POC: NEGATIVE

## 2024-06-30 LAB — VITAMIN B12: Vitamin B-12: 593 pg/mL (ref 211–911)

## 2024-06-30 LAB — BASIC METABOLIC PANEL WITH GFR
BUN: 14 mg/dL (ref 6–23)
CO2: 30 meq/L (ref 19–32)
Calcium: 9.7 mg/dL (ref 8.4–10.5)
Chloride: 100 meq/L (ref 96–112)
Creatinine, Ser: 1.18 mg/dL (ref 0.40–1.20)
GFR: 40.11 mL/min — ABNORMAL LOW
Glucose, Bld: 97 mg/dL (ref 70–99)
Potassium: 4.3 meq/L (ref 3.5–5.1)
Sodium: 137 meq/L (ref 135–145)

## 2024-06-30 LAB — HEMOGLOBIN A1C: Hgb A1c MFr Bld: 7.2 % — ABNORMAL HIGH (ref 4.6–6.5)

## 2024-06-30 LAB — TSH: TSH: 5.91 u[IU]/mL — ABNORMAL HIGH (ref 0.35–5.50)

## 2024-06-30 LAB — POC COVID19 BINAXNOW: SARS Coronavirus 2 Ag: NEGATIVE

## 2024-06-30 MED ORDER — CEPHALEXIN 500 MG PO CAPS: 500.0000 mg | ORAL_CAPSULE | Freq: Three times a day (TID) | ORAL | 0 refills | Status: AC

## 2024-06-30 NOTE — Addendum Note (Signed)
 Addended by: LADONNA INOCENTE SAILOR on: 06/30/2024 01:47 PM   Modules accepted: Orders

## 2024-06-30 NOTE — Progress Notes (Signed)
" ° °  Subjective:    Patient ID: Amy [name], female    DOB: 07-20-1929, 89 y.o.   MRN: [id number]  HPI Here with her son-in-law for several issues. First her BP suddenly jumped up a few days ago to 180/100, and talking to her the family found that she had forgotten to take all her medications for 3 days. She then resumed the medications 2 days ago, and now the BP is back to normal. The family has noticed that she has been very confused for the past week. We know she has memory loss, but they say her thinking is not as clear as usual. Of note she lives in an independent apartment at [facility]. She also complains of a rash that appeared on her face and neck about 2 weeks ago. This itches but is not painful. No fever. She has chronic diarrhea, but otherwise she has no bowel changes. No urinary symptoms. No cough or ST. She has a hx of frequent UTI's.    Review of Systems  Constitutional:  Negative for fever.  HENT: Negative.    Respiratory: Negative.    Cardiovascular: Negative.   Gastrointestinal:  Positive for diarrhea. Negative for abdominal distention, abdominal pain, anal bleeding, constipation, nausea and vomiting.  Genitourinary: Negative.   Skin:  Positive for rash.       Objective:   Physical Exam Constitutional:      General: She is not in acute distress.    Comments: In a wheelchair   Cardiovascular:     Rate and Rhythm: Normal rate and regular rhythm.     Pulses: Normal pulses.     Heart sounds: Normal heart sounds.  Pulmonary:     Effort: Pulmonary effort is normal.     Breath sounds: Normal breath sounds.  Abdominal:     General: Abdomen is flat. Bowel sounds are normal. There is no distension.     Palpations: Abdomen is soft. There is no mass.     Tenderness: There is no abdominal tenderness. There is no right CVA tenderness, left CVA tenderness, guarding or rebound.     Hernia: No hernia is present.  Neurological:     Mental Status: She is alert and oriented  to person, place, and time.           Assessment & Plan:  She recently has a few days of elevated BP, but this was likely the result of missing medications. She is now taking them per our directions. She has a folliculitis on her face and neck, and we will treat this with 10 days of Cephalexin . In addition, she has been confused lately, and this could very well be the result of a UTI. She is unable to provide a urine sample today, but the Cephalexin  we are giving her should treat this if she does indeed have a UTI. We will also get some general lab work today. I spoke to the son-in-law that she is probably not able to be independent at this point, and I recommended that she transition to assisted living. That way her medications will be more closely monitored. He will discuss this with the family. I personally spent a total of 35 minutes in the care of the patient today including getting/reviewing separately obtained history, performing a medically appropriate exam/evaluation, counseling and educating, and placing orders.  Garnette Olmsted, MD   "

## 2024-07-01 ENCOUNTER — Observation Stay (HOSPITAL_COMMUNITY)
Admission: EM | Admit: 2024-07-01 | Source: Home / Self Care | Attending: Emergency Medicine | Admitting: Emergency Medicine

## 2024-07-01 ENCOUNTER — Telehealth: Payer: Self-pay

## 2024-07-01 ENCOUNTER — Emergency Department (HOSPITAL_COMMUNITY)

## 2024-07-01 ENCOUNTER — Ambulatory Visit: Payer: Self-pay | Admitting: Family Medicine

## 2024-07-01 ENCOUNTER — Other Ambulatory Visit: Payer: Self-pay

## 2024-07-01 DIAGNOSIS — N390 Urinary tract infection, site not specified: Secondary | ICD-10-CM

## 2024-07-01 DIAGNOSIS — R4182 Altered mental status, unspecified: Principal | ICD-10-CM

## 2024-07-01 DIAGNOSIS — E039 Hypothyroidism, unspecified: Secondary | ICD-10-CM

## 2024-07-01 DIAGNOSIS — G9341 Metabolic encephalopathy: Secondary | ICD-10-CM | POA: Diagnosis present

## 2024-07-01 LAB — TROPONIN T, HIGH SENSITIVITY
Troponin T High Sensitivity: 14 ng/L (ref 0–19)
Troponin T High Sensitivity: 17 ng/L (ref 0–19)

## 2024-07-01 LAB — URINALYSIS, W/ REFLEX TO CULTURE (INFECTION SUSPECTED)
Bacteria, UA: NONE SEEN
Bilirubin Urine: NEGATIVE
Glucose, UA: NEGATIVE mg/dL
Ketones, ur: NEGATIVE mg/dL
Nitrite: NEGATIVE
Protein, ur: 100 mg/dL — AB
Specific Gravity, Urine: 1.011 (ref 1.005–1.030)
pH: 6 (ref 5.0–8.0)

## 2024-07-01 LAB — RESP PANEL BY RT-PCR (RSV, FLU A&B, COVID)  RVPGX2
Influenza A by PCR: NEGATIVE
Influenza B by PCR: NEGATIVE
Resp Syncytial Virus by PCR: NEGATIVE
SARS Coronavirus 2 by RT PCR: NEGATIVE

## 2024-07-01 LAB — AMMONIA: Ammonia: 14 umol/L (ref 9–35)

## 2024-07-01 LAB — CBC WITH DIFFERENTIAL/PLATELET
Abs Immature Granulocytes: 0.02 10*3/uL (ref 0.00–0.07)
Basophils Absolute: 0 10*3/uL (ref 0.0–0.1)
Basophils Relative: 0 %
Eosinophils Absolute: 0 10*3/uL (ref 0.0–0.5)
Eosinophils Relative: 0 %
HCT: 37.5 % (ref 36.0–46.0)
Hemoglobin: 11.8 g/dL — ABNORMAL LOW (ref 12.0–15.0)
Immature Granulocytes: 0 %
Lymphocytes Relative: 25 %
Lymphs Abs: 2.2 10*3/uL (ref 0.7–4.0)
MCH: 30.2 pg (ref 26.0–34.0)
MCHC: 31.5 g/dL (ref 30.0–36.0)
MCV: 95.9 fL (ref 80.0–100.0)
Monocytes Absolute: 0.9 10*3/uL (ref 0.1–1.0)
Monocytes Relative: 10 %
Neutro Abs: 5.5 10*3/uL (ref 1.7–7.7)
Neutrophils Relative %: 65 %
Platelets: 201 10*3/uL (ref 150–400)
RBC: 3.91 MIL/uL (ref 3.87–5.11)
RDW: 13.5 % (ref 11.5–15.5)
WBC: 8.6 10*3/uL (ref 4.0–10.5)
nRBC: 0 % (ref 0.0–0.2)

## 2024-07-01 LAB — TSH
TSH: 4.77 u[IU]/mL — ABNORMAL HIGH (ref 0.350–4.500)
TSH: 6.34 u[IU]/mL — ABNORMAL HIGH (ref 0.350–4.500)

## 2024-07-01 LAB — COMPREHENSIVE METABOLIC PANEL WITH GFR
ALT: 11 U/L (ref 0–44)
AST: 18 U/L (ref 15–41)
Albumin: 3.9 g/dL (ref 3.5–5.0)
Alkaline Phosphatase: 99 U/L (ref 38–126)
Anion gap: 12 (ref 5–15)
BUN: 13 mg/dL (ref 8–23)
CO2: 25 mmol/L (ref 22–32)
Calcium: 9.6 mg/dL (ref 8.9–10.3)
Chloride: 101 mmol/L (ref 98–111)
Creatinine, Ser: 1.09 mg/dL — ABNORMAL HIGH (ref 0.44–1.00)
GFR, Estimated: 47 mL/min — ABNORMAL LOW
Glucose, Bld: 114 mg/dL — ABNORMAL HIGH (ref 70–99)
Potassium: 3.5 mmol/L (ref 3.5–5.1)
Sodium: 138 mmol/L (ref 135–145)
Total Bilirubin: 0.7 mg/dL (ref 0.0–1.2)
Total Protein: 7.9 g/dL (ref 6.5–8.1)

## 2024-07-01 LAB — T4, FREE: Free T4: 1.28 ng/dL (ref 0.80–2.00)

## 2024-07-01 LAB — MRSA NEXT GEN BY PCR, NASAL: MRSA by PCR Next Gen: NOT DETECTED

## 2024-07-01 LAB — GLUCOSE, CAPILLARY: Glucose-Capillary: 146 mg/dL — ABNORMAL HIGH (ref 70–99)

## 2024-07-01 MED ORDER — MELATONIN 5 MG PO TABS
5.0000 mg | ORAL_TABLET | Freq: Every evening | ORAL | Status: AC | PRN
  Administered 2024-07-01: 5 mg via ORAL
  Filled 2024-07-01: qty 1

## 2024-07-01 MED ORDER — ENOXAPARIN SODIUM 40 MG/0.4ML IJ SOSY
40.0000 mg | PREFILLED_SYRINGE | Freq: Every day | INTRAMUSCULAR | Status: AC
  Administered 2024-07-01: 40 mg via SUBCUTANEOUS
  Filled 2024-07-01: qty 0.4

## 2024-07-01 MED ORDER — SERTRALINE HCL 50 MG PO TABS: 50.0000 mg | ORAL_TABLET | Freq: Every day | ORAL | Status: AC

## 2024-07-01 MED ORDER — SODIUM CHLORIDE 0.9 % IV BOLUS
1000.0000 mL | Freq: Once | INTRAVENOUS | Status: AC
  Administered 2024-07-01: 1000 mL via INTRAVENOUS

## 2024-07-01 MED ORDER — INSULIN ASPART 100 UNIT/ML IJ SOLN: 0.0000 [IU] | Freq: Three times a day (TID) | INTRAMUSCULAR | Status: AC

## 2024-07-01 MED ORDER — ONDANSETRON HCL 4 MG/2ML IJ SOLN: 4.0000 mg | Freq: Four times a day (QID) | INTRAMUSCULAR | Status: AC | PRN

## 2024-07-01 MED ORDER — ATORVASTATIN CALCIUM 40 MG PO TABS: 40.0000 mg | ORAL_TABLET | Freq: Every day | ORAL | Status: AC

## 2024-07-01 MED ORDER — SALINE SPRAY 0.65 % NA SOLN
1.0000 | NASAL | Status: AC | PRN
  Administered 2024-07-01: 1 via NASAL
  Filled 2024-07-01: qty 44

## 2024-07-01 MED ORDER — SODIUM CHLORIDE 0.9 % IV SOLN
1.0000 g | INTRAVENOUS | Status: AC
  Administered 2024-07-01: 1 g via INTRAVENOUS
  Filled 2024-07-01: qty 10

## 2024-07-01 MED ORDER — ONDANSETRON HCL 4 MG PO TABS: 4.0000 mg | ORAL_TABLET | Freq: Four times a day (QID) | ORAL | Status: AC | PRN

## 2024-07-01 MED ORDER — ACETAMINOPHEN 325 MG PO TABS
650.0000 mg | ORAL_TABLET | Freq: Four times a day (QID) | ORAL | Status: AC | PRN
  Administered 2024-07-01: 650 mg via ORAL
  Filled 2024-07-01: qty 2

## 2024-07-01 MED ORDER — FERROUS GLUCONATE 324 (38 FE) MG PO TABS
324.0000 mg | ORAL_TABLET | Freq: Every day | ORAL | Status: AC
  Filled 2024-07-01: qty 1

## 2024-07-01 MED ORDER — LEVOTHYROXINE SODIUM 100 MCG PO TABS: 200.0000 ug | ORAL_TABLET | Freq: Every day | ORAL | Status: AC

## 2024-07-01 MED ORDER — DONEPEZIL HCL 10 MG PO TABS
10.0000 mg | ORAL_TABLET | Freq: Every day | ORAL | Status: AC
  Administered 2024-07-01: 10 mg via ORAL
  Filled 2024-07-01: qty 1

## 2024-07-01 MED ORDER — GABAPENTIN 300 MG PO CAPS
300.0000 mg | ORAL_CAPSULE | Freq: Two times a day (BID) | ORAL | Status: AC
  Administered 2024-07-01: 300 mg via ORAL
  Filled 2024-07-01: qty 1

## 2024-07-01 MED ORDER — ASPIRIN 81 MG PO TBEC: 81.0000 mg | DELAYED_RELEASE_TABLET | Freq: Every day | ORAL | Status: AC

## 2024-07-01 MED ORDER — SODIUM CHLORIDE 0.9 % IV SOLN
1.0000 g | INTRAVENOUS | Status: AC
  Filled 2024-07-01: qty 10

## 2024-07-01 MED ORDER — OXYBUTYNIN CHLORIDE ER 5 MG PO TB24
10.0000 mg | ORAL_TABLET | Freq: Every day | ORAL | Status: AC
  Administered 2024-07-01: 10 mg via ORAL
  Filled 2024-07-01: qty 2
  Filled 2024-07-01: qty 1

## 2024-07-01 MED ORDER — ACETAMINOPHEN 650 MG RE SUPP: 650.0000 mg | Freq: Four times a day (QID) | RECTAL | Status: AC | PRN

## 2024-07-01 MED ORDER — TRAMADOL HCL 50 MG PO TABS: 50.0000 mg | ORAL_TABLET | Freq: Four times a day (QID) | ORAL | Status: AC | PRN

## 2024-07-01 NOTE — Telephone Encounter (Signed)
 Copied from CRM [id number]. Topic: General - Other >> Jul 01, 2024  1:58 PM Aisha D wrote: Reason for CRM: Pt's daughter Neville wanted to inform Dr.Fry that the pt has gotten worse since their visit on yesterday. She stated that the pt was transferred to [facility] today and would like for Dr.Fry to follow up with the pt. RA:[id number]

## 2024-07-01 NOTE — H&P (Signed)
 " History and Physical    Amy [name] FMW:[id number] DOB: [birth year] DOA: 07/01/2024  I have briefly reviewed the patient's prior medical records in [facility]  PCP: Johnny Garnette LABOR, MD  Patient coming from: [facility]  Chief Complaint: Confusion  HPI: Amy [name] is a 89 y.o. female with medical history significant of mild dementia, DM2, hypothyroidism, hypertension, hyperlipidemia who comes into the hospital with complaints of confusion.  Patient's daughter as well as son-in-law are at bedside and contribute to the story.  Patient herself currently has no complaints and is aware that she was brought to the hospital for more confusion but she does not feel confused.  Daughter tells me that she has been more confused over the last 3 days, has been having poor p.o. intake forgot to take all her medications for the past 3 days.  There are no reported fever or chills, no chest pains, no shortness of breath or cough, but patient has been reported that she has had increased nasal congestion.  There is no abdominal pain, nausea, vomiting or diarrhea.  Patient denies any dysuria but tells me that the urine has been smelling stronger than normal.  With that she was placed on Keflex  at her ALF and has gotten a dose earlier today.  She thinks the year is 2025 and the month is July, and at baseline she is alert and oriented x 4 per family.  ED Course: In the ED patient is afebrile 97.8, normotensive, satting well on room air.  Blood work is essentially unremarkable with normal white count, creatinine 1.0.  A TSH was checked and it was 4.7.  RVP is negative for flu A, B, RSV or COVID.  Urinalysis is small leukocytes but no bacteria.  CT head was unremarkable but it did show remote CVAs.  EKG reviewed independently, showed sinus rhythm.  She was given ceftriaxone  and we are asked to admit  Review of Systems: All systems reviewed, and apart from HPI, all negative  Past Medical History:   Diagnosis Date   ABSCESS 12/18/2009   DIABETES MELLITUS, TYPE II 05/22/2009   Diverticulosis    Hemorrhoids    HYPERGLYCEMIA 05/22/2009   HYPERTENSION 04/13/2007   HYPOTHYROIDISM 04/13/2007   NEOPLASM, SKIN, UNCERTAIN BEHAVIOR 01/11/2010   Stroke (HCC)    Tubular adenoma of colon 02/2001    Past Surgical History:  Procedure Laterality Date   ABDOMINAL HYSTERECTOMY     APPENDECTOMY     BIOPSY OF SKIN SUBCUTANEOUS TISSUE AND/OR MUCOUS MEMBRANE  09/07/2023   Procedure: BIOPSY, SKIN, SUBCUTANEOUS TISSUE, OR MUCOUS MEMBRANE;  Surgeon: Wilhelmenia Aloha Raddle., MD;  Location: [facility];  Service: Gastroenterology;;   CHOLECYSTECTOMY     COLONOSCOPY  04-05-04   per Dr. Debrah, no polyps    ESOPHAGOGASTRODUODENOSCOPY N/A 09/07/2023   Procedure: EGD (ESOPHAGOGASTRODUODENOSCOPY);  Surgeon: Wilhelmenia Aloha Raddle., MD;  Location: [facility];  Service: Gastroenterology;  Laterality: N/A;   OOPHORECTOMY     THYROIDECTOMY     secondary to nodule, no cancer   TONSILLECTOMY AND ADENOIDECTOMY       reports that she has never smoked. She has never used smokeless tobacco. She reports that she does not drink alcohol and does not use drugs.  Allergies[1]  Family History  Problem Relation Age of Onset   Stroke Father    Hypertension Mother    Stroke Mother    Huntington's disease Daughter 43   Colon cancer Neg Hx     Prior to  Admission medications  Medication Sig Start Date End Date Taking? Authorizing Provider  amLODipine  (NORVASC ) 5 MG tablet Take 1 tablet (5 mg total) by mouth 2 (two) times daily. 06/07/24   Johnny Garnette LABOR, MD  aspirin  81 MG tablet Take 1 tablet (81 mg total) by mouth daily. 05/12/16   Johnny Garnette LABOR, MD  atorvastatin  (LIPITOR) 40 MG tablet Take 1 tablet (40 mg total) by mouth daily. 06/07/24   Johnny Garnette LABOR, MD  blood glucose meter kit and supplies Dispense based on patient and insurance preference. Use up to four times daily as directed. (FOR ICD-10 E10.9, E11.9).  04/07/19   Johnny Garnette LABOR, MD  cephALEXin  (KEFLEX ) 500 MG capsule Take 1 capsule (500 mg total) by mouth 3 (three) times daily for 10 days. 06/30/24 07/10/24  Johnny Garnette LABOR, MD  docusate sodium  (COLACE) 100 MG capsule Take 1 capsule (100 mg total) by mouth 2 (two) times daily. 05/07/16   Angiulli, Toribio PARAS, PA-C  donepezil  (ARICEPT ) 10 MG tablet TAKE ONE TABLET BY MOUTH AT BEDTIME 04/08/24   Johnny Garnette LABOR, MD  famotidine  (PEPCID ) 40 MG tablet Take 1 tablet (40 mg total) by mouth at bedtime. 06/07/24   Johnny Garnette LABOR, MD  ferrous gluconate  [facility]) 324 MG tablet TAKE ONE TABLET BY MOUTH IN THE MORNING WITH BREAKFAST 04/11/24   Mansouraty, Gabriel Jr., MD  gabapentin  (NEURONTIN ) 300 MG capsule TAKE ONE CAPSULE BY MOUTH TWICE DAILY 01/18/24   Johnny Garnette LABOR, MD  glipiZIDE  (GLUCOTROL ) 5 MG tablet Take 1 tablet (5 mg total) by mouth 2 (two) times daily before a meal. 10/06/23   Johnny Garnette LABOR, MD  ibuprofen (ADVIL,MOTRIN) 200 MG tablet Take 400 mg by mouth at bedtime as needed (RLS).    [provider]  JANUVIA  100 MG tablet Take 1 tablet (100 mg total) by mouth daily. 06/07/24   Johnny Garnette LABOR, MD  levothyroxine  (SYNTHROID ) 200 MCG tablet Take 1 tablet (200 mcg total) by mouth daily. 06/07/24   Johnny Garnette LABOR, MD  meclizine  (ANTIVERT ) 25 MG tablet Take 1 tablet (25 mg total) by mouth 3 (three) times daily as needed for dizziness. 04/28/18   Armenta Canning, MD  meloxicam  (MOBIC ) 15 MG tablet Take 1 tablet (15 mg total) by mouth daily. 02/25/23   Johnny Garnette LABOR, MD  metFORMIN  (GLUCOPHAGE ) 500 MG tablet TAKE ONE TABLET BY MOUTH TWICE DAILY 03/16/24   Johnny Garnette LABOR, MD  Multiple Vitamin (MULTIVITAMIN WITH MINERALS) TABS tablet Take 1 tablet by mouth daily.    [provider]  omeprazole  (PRILOSEC) 40 MG capsule Take 1 capsule (40 mg total) by mouth in the morning. 06/07/24   Johnny Garnette LABOR, MD  oxybutynin  (DITROPAN -XL) 10 MG 24 hr tablet Take 1 tablet (10 mg total) by mouth at bedtime. 06/07/24    Johnny Garnette LABOR, MD  sertraline  (ZOLOFT ) 50 MG tablet Take 1 tablet (50 mg total) by mouth daily. 06/07/24   Johnny Garnette LABOR, MD  sucralfate  (CARAFATE ) 1 g tablet Take 1 tablet (1 g total) by mouth 4 (four) times daily. 10/30/23   Johnny Garnette LABOR, MD  traMADol  (ULTRAM ) 50 MG tablet TAKE 2 TABLETS EVERY 6 HOURS AS NEEDED FOR MODERATE PAIN. 02/12/24   Johnny Garnette LABOR, MD    Physical Exam: Vitals:   07/01/24 1351 07/01/24 1545  BP: (!) 145/83 136/69  Pulse: 86 67  Resp: 16 17  Temp: 97.8 F (36.6 C)   TempSrc: Oral   SpO2: 96% 97%  Constitutional: NAD, calm, comfortable Eyes: PERRL, lids and conjunctivae normal ENMT: Mucous membranes are moist.  Neck: normal, supple Respiratory: clear to auscultation bilaterally, no wheezing, no crackles. Normal respiratory effort. No accessory muscle use.  Cardiovascular: Regular rate and rhythm, no murmurs / rubs / gallops. No extremity edema. 2+ pedal pulses.  Abdomen: no tenderness, no masses palpated. Bowel sounds positive.  Musculoskeletal: no clubbing / cyanosis. Normal muscle tone.  Skin: no rashes, lesions, ulcers. No induration Neurologic: Nonfocal  Labs on Admission: I have personally reviewed following labs and imaging studies  CBC: Recent Labs  Lab 06/30/24 1215 07/01/24 1433  WBC 7.5 8.6  NEUTROABS 4.7 5.5  HGB 11.9* 11.8*  HCT 35.9* 37.5  MCV 92.6 95.9  PLT 189.0 201   Basic Metabolic Panel: Recent Labs  Lab 06/30/24 1215 07/01/24 1433  NA 137 138  K 4.3 3.5  CL 100 101  CO2 30 25  GLUCOSE 97 114*  BUN 14 13  CREATININE 1.18 1.09*  CALCIUM  9.7 9.6   Liver Function Tests: Recent Labs  Lab 06/30/24 1215 07/01/24 1433  AST 16 18  ALT 13 11  ALKPHOS 91 99  BILITOT 0.8 0.7  PROT 8.3 7.9  ALBUMIN 4.2 3.9   Coagulation Profile: No results for input(s): INR, PROTIME in the last 168 hours. BNP (last 3 results) No results for input(s): PROBNP in the last 8760 hours. CBG: No results for input(s): GLUCAP in  the last 168 hours. Thyroid  Function Tests: Recent Labs    07/01/24 1433  TSH 4.770*   Urine analysis:    Component Value Date/Time   COLORURINE YELLOW 07/01/2024 1625   APPEARANCEUR CLEAR 07/01/2024 1625   LABSPEC 1.011 07/01/2024 1625   PHURINE 6.0 07/01/2024 1625   GLUCOSEU NEGATIVE 07/01/2024 1625   HGBUR SMALL (A) 07/01/2024 1625   HGBUR negative 12/18/2008 1559   BILIRUBINUR NEGATIVE 07/01/2024 1625   BILIRUBINUR neg 01/22/2023 1523   KETONESUR NEGATIVE 07/01/2024 1625   PROTEINUR 100 (A) 07/01/2024 1625   UROBILINOGEN 0.2 01/22/2023 1523   UROBILINOGEN 0.2 01/16/2013 1358   NITRITE NEGATIVE 07/01/2024 1625   LEUKOCYTESUR SMALL (A) 07/01/2024 1625     Radiological Exams on Admission: CT Head Wo Contrast Result Date: 07/01/2024 EXAM: CT HEAD WITHOUT CONTRAST 07/01/2024 02:54:35 PM TECHNIQUE: CT of the head was performed without the administration of intravenous contrast. Automated exposure control, iterative reconstruction, and/or weight based adjustment of the mA/kV was utilized to reduce the radiation dose to as low as reasonably achievable. COMPARISON: 05/16/2020 CLINICAL HISTORY: Ataxia, head trauma. FINDINGS: BRAIN AND VENTRICLES: No acute hemorrhage. No evidence of acute infarct. No hydrocephalus. No extra-axial collection. No mass effect or midline shift. Moderate chronic microvascular ischemic changes. Encephalomalacia in the left occipital lobe suggestive of remote infarct. Remote infarcts in the bilateral cerebellum, left greater than right. Mild to moderate generalized parenchymal volume loss. Additional remote cortical infarcts in the right parietooccipital lobes. Atherosclerosis of the carotid siphons and intracranial vertebral arteries. SINUSES: Mucosal thickening throughout the ethmoid sinuses. Additional mild mucosal thickening in the sphenoid sinuses. Small air fluid level in the right maxillary sinus. SOFT TISSUES AND SKULL: No acute soft tissue abnormality. No  skull fracture. Bilateral lens replacement. IMPRESSION: 1. No acute intracranial abnormality related to the head trauma. 2. Remote infarcts in the left occipital lobe, right parietooccipital lobes, and bilateral cerebellar infarcts. 3. Moderate chronic microvascular ischemic changes. 4. Paranasal sinus mucosal thickening greatest in the ethmoid sinuses. Small air-fluid level in the right maxillary sinus. Electronically  signed by: Donnice Mania MD 07/01/2024 03:46 PM EST RP Workstation: [id number]    EKG: Independently reviewed. Sinus rhythm  Assessment/Plan Principal problem Acute metabolic encephalopathy, possible UTI-cause may be UTI but not entirely clear.  Urinalysis is not that impressive for UTI, however she did receive Keflex  prior to coming to the hospital, took a dose earlier today. - Continue ceftriaxone  that was started in the ED, urine culture is pending however, again this was after she got Keflex  at home - She had some nasal congestion, flu, RSV, COVID-negative.  For completeness we will obtain full RVP  Active problems Hypothyroidism -has not taken her Synthroid  in the last 3 days, resume Synthroid , please review meds again once med rec is done.  TSH is 4.7  Essential hypertension-normotensive, hold home medications  DM2-hold oral agents, placed on sliding scale and allow carb modified diet.  A1c 7.2 which is acceptable in her age group.  Continue gabapentin  for neuropathy  Prior CVAs -noted, resume home aspirin   Mild dementia-continue home Aricept   Depression-continue home Zoloft   Iron  deficiency anemia-hemoglobin stable Bleeding, continue iron   Obesity, class I-BMI 31   DVT prophylaxis: Lovenox   Code Status: DNR  Family Communication: Daughter and son-in-law at bedside Bed Type: MedSurg Consults called: None  Nilda Fendt, MD, PhD [facility]  Contact via [email or web address]  07/01/2024, 6:09 PM         [1]  Allergies Allergen Reactions   Ativan   [Lorazepam ] Other (See Comments)    Caused delirium, confusion, disorientation, slightly increased agitation.   Diclofenac Sodium Nausea And Vomiting   Feldene [Piroxicam] Other (See Comments)    Dizzy   Latex Rash   Tape Rash   "

## 2024-07-01 NOTE — Telephone Encounter (Signed)
 Noted.

## 2024-07-01 NOTE — Telephone Encounter (Signed)
 Copied from CRM [id number]. Topic: Clinical - Lab/Test Results >> Jul 01, 2024 11:22 AM Alfonso HERO wrote: Reason for CRM: patient son in law mike calling back for results. Was holding but they hung up. Please give them a call when you can.

## 2024-07-01 NOTE — ED Provider Notes (Incomplete)
 " [facility] AT [facility] Provider Note  CSN: [id number] Arrival date & time: 07/01/24 1340  Chief Complaint(s) Altered Mental Status  HPI Amy [name] is a 89 y.o. female here today for altered mental status of 2 days duration.  Patient lives in an independent living at [facility].  She is here today with her son-in-law and daughter to provide history.  Patient typically AO x 4, family reports that over the last 2 days, patient has been confused in her single bedroom apartment.  She is not known where the telephone is, has been unable to get up to go to the bathroom which is unusual for her.  She was seen by her PCP yesterday, had blood work done that showed an elevation in her TSH, patient reportedly had forgotten to take her medication for the last 3 days.  Patient typically ambulates with a walker.   Past Medical History Past Medical History:  Diagnosis Date   ABSCESS 12/18/2009   DIABETES MELLITUS, TYPE II 05/22/2009   Diverticulosis    Hemorrhoids    HYPERGLYCEMIA 05/22/2009   HYPERTENSION 04/13/2007   HYPOTHYROIDISM 04/13/2007   NEOPLASM, SKIN, UNCERTAIN BEHAVIOR 01/11/2010   Stroke (HCC)    Tubular adenoma of colon 02/2001   Patient Active Problem List   Diagnosis Date Noted   Iron  deficiency anemia 09/07/2023   Encounter for long-term (current) use of non-steroidal anti-inflammatories 09/07/2023   Dark stools 09/07/2023   Gastritis without bleeding 09/07/2023   Chronic low back pain 06/12/2023   Bilateral chronic knee pain 06/12/2023   GERD (gastroesophageal reflux disease) 06/03/2021   OAB (overactive bladder) 10/26/2020   Memory loss 01/25/2019   Subcortical infarction (HCC) 04/25/2016   Left-sided neglect 04/25/2016   Cerebral thrombosis with cerebral infarction 04/23/2016   Left homonymous hemianopsia 04/22/2016   Dizziness and giddiness 07/27/2015   Balance disorder 07/27/2015   Depression 03/12/2015   Abdominal pain,  generalized 01/30/2015   LUQ abdominal pain 08/19/2014   History of colonic polyps 06/18/2011   NEOPLASM, SKIN, UNCERTAIN BEHAVIOR 01/11/2010   Diabetes mellitus without complication (HCC) 05/22/2009   Hypothyroidism 04/13/2007   Essential hypertension 04/13/2007   Home Medication(s) Prior to Admission medications  Medication Sig Start Date End Date Taking? Authorizing Provider  amLODipine  (NORVASC ) 5 MG tablet Take 1 tablet (5 mg total) by mouth 2 (two) times daily. 06/07/24   Johnny Garnette LABOR, MD  aspirin  81 MG tablet Take 1 tablet (81 mg total) by mouth daily. 05/12/16   Johnny Garnette LABOR, MD  atorvastatin  (LIPITOR) 40 MG tablet Take 1 tablet (40 mg total) by mouth daily. 06/07/24   Johnny Garnette LABOR, MD  blood glucose meter kit and supplies Dispense based on patient and insurance preference. Use up to four times daily as directed. (FOR ICD-10 E10.9, E11.9). 04/07/19   Johnny Garnette LABOR, MD  cephALEXin  (KEFLEX ) 500 MG capsule Take 1 capsule (500 mg total) by mouth 3 (three) times daily for 10 days. 06/30/24 07/10/24  Johnny Garnette LABOR, MD  docusate sodium  (COLACE) 100 MG capsule Take 1 capsule (100 mg total) by mouth 2 (two) times daily. 05/07/16   Angiulli, Toribio PARAS, PA-C  donepezil  (ARICEPT ) 10 MG tablet TAKE ONE TABLET BY MOUTH AT BEDTIME 04/08/24   Johnny Garnette LABOR, MD  famotidine  (PEPCID ) 40 MG tablet Take 1 tablet (40 mg total) by mouth at bedtime. 06/07/24   Johnny Garnette LABOR, MD  ferrous gluconate  (FERGON) 324 MG tablet TAKE ONE TABLET BY MOUTH  IN THE MORNING WITH BREAKFAST 04/11/24   Mansouraty, Aloha Raddle., MD  gabapentin  (NEURONTIN ) 300 MG capsule TAKE ONE CAPSULE BY MOUTH TWICE DAILY 01/18/24   Johnny Garnette LABOR, MD  glipiZIDE  (GLUCOTROL ) 5 MG tablet Take 1 tablet (5 mg total) by mouth 2 (two) times daily before a meal. 10/06/23   Johnny Garnette LABOR, MD  ibuprofen (ADVIL,MOTRIN) 200 MG tablet Take 400 mg by mouth at bedtime as needed (RLS).    [provider]  JANUVIA  100 MG tablet Take 1 tablet (100 mg  total) by mouth daily. 06/07/24   Johnny Garnette LABOR, MD  levothyroxine  (SYNTHROID ) 200 MCG tablet Take 1 tablet (200 mcg total) by mouth daily. 06/07/24   Johnny Garnette LABOR, MD  meclizine  (ANTIVERT ) 25 MG tablet Take 1 tablet (25 mg total) by mouth 3 (three) times daily as needed for dizziness. 04/28/18   Armenta Canning, MD  meloxicam  (MOBIC ) 15 MG tablet Take 1 tablet (15 mg total) by mouth daily. 02/25/23   Johnny Garnette LABOR, MD  metFORMIN  (GLUCOPHAGE ) 500 MG tablet TAKE ONE TABLET BY MOUTH TWICE DAILY 03/16/24   Johnny Garnette LABOR, MD  Multiple Vitamin (MULTIVITAMIN WITH MINERALS) TABS tablet Take 1 tablet by mouth daily.    [provider]  omeprazole  (PRILOSEC) 40 MG capsule Take 1 capsule (40 mg total) by mouth in the morning. 06/07/24   Johnny Garnette LABOR, MD  oxybutynin  (DITROPAN -XL) 10 MG 24 hr tablet Take 1 tablet (10 mg total) by mouth at bedtime. 06/07/24   Johnny Garnette LABOR, MD  sertraline  (ZOLOFT ) 50 MG tablet Take 1 tablet (50 mg total) by mouth daily. 06/07/24   Johnny Garnette LABOR, MD  sucralfate  (CARAFATE ) 1 g tablet Take 1 tablet (1 g total) by mouth 4 (four) times daily. 10/30/23   Johnny Garnette LABOR, MD  traMADol  (ULTRAM ) 50 MG tablet TAKE 2 TABLETS EVERY 6 HOURS AS NEEDED FOR MODERATE PAIN. 02/12/24   Johnny Garnette LABOR, MD                                                                                                                                    Past Surgical History Past Surgical History:  Procedure Laterality Date   ABDOMINAL HYSTERECTOMY     APPENDECTOMY     BIOPSY OF SKIN SUBCUTANEOUS TISSUE AND/OR MUCOUS MEMBRANE  09/07/2023   Procedure: BIOPSY, SKIN, SUBCUTANEOUS TISSUE, OR MUCOUS MEMBRANE;  Surgeon: Wilhelmenia Aloha Raddle., MD;  Location: [facility];  Service: Gastroenterology;;   CHOLECYSTECTOMY     COLONOSCOPY  04-05-04   per Dr. Debrah, no polyps    ESOPHAGOGASTRODUODENOSCOPY N/A 09/07/2023   Procedure: EGD (ESOPHAGOGASTRODUODENOSCOPY);  Surgeon: Wilhelmenia Aloha Raddle., MD;  Location:  [facility];  Service: Gastroenterology;  Laterality: N/A;   OOPHORECTOMY     THYROIDECTOMY     secondary to nodule, no cancer   TONSILLECTOMY AND ADENOIDECTOMY     Family History Family History  Problem  Relation Age of Onset   Stroke Father    Hypertension Mother    Stroke Mother    Huntington's disease Daughter 65   Colon cancer Neg Hx     Social History Social History[1] Allergies Ativan  [lorazepam ], Diclofenac sodium, Feldene [piroxicam], Latex, and Tape  Review of Systems Review of Systems  Physical Exam Vital Signs  I have reviewed the triage vital signs BP (!) 145/83   Pulse 86   Temp 97.8 F (36.6 C) (Oral)   Resp 16   SpO2 96%   Physical Exam Vitals and nursing note reviewed.  HENT:     Head: Normocephalic.     Mouth/Throat:     Mouth: Mucous membranes are dry.  Eyes:     Pupils: Pupils are equal, round, and reactive to light.  Cardiovascular:     Rate and Rhythm: Normal rate.  Pulmonary:     Effort: Pulmonary effort is normal.  Abdominal:     General: Abdomen is flat.     Palpations: Abdomen is soft.  Musculoskeletal:        General: No swelling or deformity.     Cervical back: Normal range of motion.  Skin:    General: Skin is warm.  Neurological:     General: No focal deficit present.     Mental Status: She is alert and oriented to person, place, and time.     Cranial Nerves: No cranial nerve deficit.     Motor: No weakness.  Psychiatric:        Mood and Affect: Mood normal.     ED Results and Treatments Labs (all labs ordered are listed, but only abnormal results are displayed) Labs Reviewed  CBC WITH DIFFERENTIAL/PLATELET - Abnormal; Notable for the following components:      Result Value   Hemoglobin 11.8 (*)    All other components within normal limits  RESP PANEL BY RT-PCR (RSV, FLU A&B, COVID)  RVPGX2  COMPREHENSIVE METABOLIC PANEL WITH GFR  URINALYSIS, W/ REFLEX TO CULTURE (INFECTION SUSPECTED)  TSH  T4, FREE  TROPONIN  T, HIGH SENSITIVITY                                                                                                                          Radiology No results found.  Pertinent labs & imaging results that were available during my care of the patient were reviewed by me and considered in my medical decision making (see MDM for details).  Medications Ordered in ED Medications - No data to display  Procedures Procedures  (including critical care time)  Medical Decision Making / ED Course   This patient presents to the ED for concern of delirium, this involves an extensive number of treatment options, and is a complaint that carries with it a high risk of complications and morbidity.  The differential diagnosis includes delirium, metabolic encephalopathy, less likely sepsis, dehydration, medication noncompliance.  MDM: Unclear etiology for the patient's delirium.  She has not taken her medications for past 3 days, this certainly could be contributing.  I reviewed the patient's recent PCP office note.  She was started on Keflex  yesterday for a folliculitis on her face and neck.  Overall is well-appearing.  She has no urinary symptoms.   Additional history obtained: -Additional history obtained from son-in-law and daughter at bedside -External records from outside source obtained and reviewed including: Chart review including previous PCP office note.  Lab Tests: -I ordered, reviewed, and interpreted labs.   The pertinent results include:   Labs Reviewed  CBC WITH DIFFERENTIAL/PLATELET - Abnormal; Notable for the following components:      Result Value   Hemoglobin 11.8 (*)    All other components within normal limits  RESP PANEL BY RT-PCR (RSV, FLU A&B, COVID)  RVPGX2  COMPREHENSIVE METABOLIC PANEL WITH GFR  URINALYSIS, W/ REFLEX TO CULTURE (INFECTION  SUSPECTED)  TSH  T4, FREE  TROPONIN T, HIGH SENSITIVITY      EKG my independent review of the patient's EKG shows no ST segment depressions or elevations, no T wave inversions, no evidence of acute ischemia.  EKG Interpretation Date/Time:    Ventricular Rate:    PR Interval:    QRS Duration:    QT Interval:    QTC Calculation:   R Axis:      Text Interpretation:           Imaging Studies ordered: I ordered imaging studies including CT head I independently visualized and interpreted imaging. I agree with the radiologist interpretation   Medicines ordered and prescription drug management: No orders of the defined types were placed in this encounter.   -I have reviewed the patients home medicines and have made adjustments as needed  Critical interventions ***  Consultations Obtained: I requested consultation with the ***,  and discussed lab and imaging findings as well as pertinent plan - they recommend: ***   Cardiac Monitoring: The patient was maintained on a cardiac monitor.  I personally viewed and interpreted the cardiac monitored which showed an underlying rhythm of: ***  Social Determinants of Health:  Factors impacting patients care include: ***   Reevaluation: After the interventions noted above, I reevaluated the patient and found that they have :{resolved/improved/worsened:23923::improved}  Co morbidities that complicate the patient evaluation  Past Medical History:  Diagnosis Date   ABSCESS 12/18/2009   DIABETES MELLITUS, TYPE II 05/22/2009   Diverticulosis    Hemorrhoids    HYPERGLYCEMIA 05/22/2009   HYPERTENSION 04/13/2007   HYPOTHYROIDISM 04/13/2007   NEOPLASM, SKIN, UNCERTAIN BEHAVIOR 01/11/2010   Stroke (HCC)    Tubular adenoma of colon 02/2001      Dispostion: I considered admission for this patient, ***     Final Clinical Impression(s) / ED Diagnoses Final diagnoses:  None     @PCDICTATION @     [1]  Social  History Tobacco Use   Smoking status: Never   Smokeless tobacco: Never  Vaping Use   Vaping status: Never Used  Substance Use Topics   Alcohol use: No    Alcohol/week:  0.0 standard drinks of alcohol   Drug use: No   "

## 2024-07-01 NOTE — ED Triage Notes (Addendum)
 Pt BIB EMS due to increased confusion and weakness x3 days. Started antibiotics today for unknown infection from PCP yesterday.   HOH

## 2024-07-01 NOTE — ED Provider Notes (Signed)
 Patient signed to me by Dr. Mannie pending results of workup.  Urinalysis does show some infection.  Family states that patient is still altered at this time.  Has not been on her medications.  Patient is on IV Rocephin  will admit to the hospital service   Dasie Faden, MD 07/01/24 1743

## 2024-07-01 NOTE — Telephone Encounter (Signed)
 Lab results were given per CMA Elijah

## 2024-09-28 ENCOUNTER — Other Ambulatory Visit

## 2025-04-19 ENCOUNTER — Ambulatory Visit
